# Patient Record
Sex: Female | Born: 1945 | Race: White | Hispanic: No | Marital: Married | Smoking: Never smoker
Health system: Southern US, Community
[De-identification: ages and names within clinical notes are randomized; demographics above are authoritative.]

## PROBLEM LIST (undated history)

## (undated) DIAGNOSIS — G5600 Carpal tunnel syndrome, unspecified upper limb: Secondary | ICD-10-CM

## (undated) DIAGNOSIS — M26609 Unspecified temporomandibular joint disorder, unspecified side: Secondary | ICD-10-CM

## (undated) DIAGNOSIS — J45909 Unspecified asthma, uncomplicated: Secondary | ICD-10-CM

## (undated) DIAGNOSIS — E119 Type 2 diabetes mellitus without complications: Secondary | ICD-10-CM

## (undated) HISTORY — PX: TUMOR REMOVAL: SHX12

## (undated) HISTORY — PX: CHOLECYSTECTOMY: SHX55

## (undated) HISTORY — DX: Unspecified asthma, uncomplicated: J45.909

## (undated) HISTORY — DX: Type 2 diabetes mellitus without complications: E11.9

## (undated) HISTORY — PX: SHOULDER SURGERY: SHX246

## (undated) HISTORY — PX: APPENDECTOMY: SHX54

## (undated) HISTORY — DX: Unspecified temporomandibular joint disorder, unspecified side: M26.609

## (undated) HISTORY — PX: PARTIAL HYSTERECTOMY: SHX80

## (undated) HISTORY — DX: Carpal tunnel syndrome, unspecified upper limb: G56.00

## (undated) MED FILL — Vinblastine Sulfate Inj 1 MG/ML: INTRAVENOUS | Qty: 7.5 | Status: AC

## (undated) MED FILL — Dacarbazine For Inj 200 MG: INTRAVENOUS | Qty: 47 | Status: AC

## (undated) MED FILL — Doxorubicin HCl Inj 2 MG/ML: INTRAVENOUS | Qty: 16 | Status: AC

## (undated) MED FILL — Fosaprepitant Dimeglumine For IV Infusion 150 MG (Base Eq): INTRAVENOUS | Qty: 5 | Status: AC

## (undated) MED FILL — Iron Sucrose Inj 20 MG/ML (Fe Equiv): INTRAVENOUS | Qty: 10 | Status: AC

---

## 1999-11-11 ENCOUNTER — Inpatient Hospital Stay (HOSPITAL_COMMUNITY): Admission: EM | Admit: 1999-11-11 | Discharge: 1999-11-14 | Payer: Self-pay | Admitting: Internal Medicine

## 2001-01-09 ENCOUNTER — Other Ambulatory Visit: Admission: RE | Admit: 2001-01-09 | Discharge: 2001-01-09 | Payer: Self-pay | Admitting: Family Medicine

## 2004-10-17 ENCOUNTER — Ambulatory Visit: Payer: Self-pay | Admitting: Family Medicine

## 2004-10-18 ENCOUNTER — Ambulatory Visit: Payer: Self-pay | Admitting: Cardiology

## 2004-10-18 ENCOUNTER — Observation Stay (HOSPITAL_COMMUNITY): Admission: AD | Admit: 2004-10-18 | Discharge: 2004-10-19 | Payer: Self-pay | Admitting: Nephrology

## 2004-10-27 ENCOUNTER — Ambulatory Visit: Payer: Self-pay | Admitting: Family Medicine

## 2004-12-19 ENCOUNTER — Ambulatory Visit: Payer: Self-pay | Admitting: Family Medicine

## 2005-06-01 ENCOUNTER — Ambulatory Visit: Payer: Self-pay | Admitting: Family Medicine

## 2005-06-28 ENCOUNTER — Ambulatory Visit: Payer: Self-pay | Admitting: Family Medicine

## 2005-07-04 ENCOUNTER — Ambulatory Visit: Payer: Self-pay | Admitting: Family Medicine

## 2005-07-11 ENCOUNTER — Ambulatory Visit: Payer: Self-pay | Admitting: Family Medicine

## 2005-08-27 ENCOUNTER — Ambulatory Visit: Payer: Self-pay | Admitting: Family Medicine

## 2005-10-12 ENCOUNTER — Ambulatory Visit: Payer: Self-pay | Admitting: Family Medicine

## 2005-11-06 ENCOUNTER — Ambulatory Visit: Payer: Self-pay | Admitting: Family Medicine

## 2005-11-16 ENCOUNTER — Ambulatory Visit: Payer: Self-pay | Admitting: Family Medicine

## 2005-12-19 ENCOUNTER — Ambulatory Visit: Payer: Self-pay | Admitting: Family Medicine

## 2015-08-25 ENCOUNTER — Encounter: Payer: Self-pay | Admitting: Allergy and Immunology

## 2015-08-25 ENCOUNTER — Ambulatory Visit (INDEPENDENT_AMBULATORY_CARE_PROVIDER_SITE_OTHER): Payer: Medicare HMO | Admitting: Allergy and Immunology

## 2015-08-25 VITALS — BP 128/78 | HR 82 | Temp 97.5°F | Resp 18 | Ht 62.99 in | Wt 168.7 lb

## 2015-08-25 DIAGNOSIS — J4540 Moderate persistent asthma, uncomplicated: Secondary | ICD-10-CM

## 2015-08-25 DIAGNOSIS — K219 Gastro-esophageal reflux disease without esophagitis: Secondary | ICD-10-CM | POA: Insufficient documentation

## 2015-08-25 DIAGNOSIS — J387 Other diseases of larynx: Secondary | ICD-10-CM | POA: Diagnosis not present

## 2015-08-25 MED ORDER — ALBUTEROL SULFATE (2.5 MG/3ML) 0.083% IN NEBU
2.5000 mg | INHALATION_SOLUTION | Freq: Once | RESPIRATORY_TRACT | Status: AC
  Administered 2015-08-25: 2.5 mg via RESPIRATORY_TRACT

## 2015-08-25 MED ORDER — MONTELUKAST SODIUM 10 MG PO TABS: 10.0000 mg | ORAL_TABLET | Freq: Every day | ORAL | Status: DC

## 2015-08-25 MED ORDER — MOMETASONE FURO-FORMOTEROL FUM 200-5 MCG/ACT IN AERO
2.0000 | INHALATION_SPRAY | Freq: Two times a day (BID) | RESPIRATORY_TRACT | Status: DC
Start: 2015-08-25 — End: 2015-11-01

## 2015-08-25 MED ORDER — RANITIDINE HCL 300 MG PO CAPS: 300.0000 mg | ORAL_CAPSULE | Freq: Every evening | ORAL | Status: DC

## 2015-08-25 NOTE — Patient Instructions (Signed)
  1. Allergen avoidance measure  2. Treat inflammation:   A. Dulera 200 - two puffs two times per day with spacer.   B. Montelukast 10 mg - 0ne tablet one time per day  3. Treat reflux:   A. No caffeine or chocolate   B. Omeprazole 40 - one tablet one time per day - AM   C. Ranitidine 300mg  - one tablet one time per day - PM  4. If needed:   A. Proair HFA - 2 puffs every 4-6 hours  5. Review last chest X-ray. May require another chest X-ray  6. Get a flu vaccine  7. Return in 4 weeks

## 2015-08-26 ENCOUNTER — Telehealth: Payer: Self-pay

## 2015-08-26 NOTE — Telephone Encounter (Signed)
I informed patient that her chest X-Ray is ok, per Dr.Kozlow.

## 2015-08-26 NOTE — Progress Notes (Signed)
Subjective:   Patient ID: Melanie Miles is a 69 y.o. female with a chief complaint of  Chief Complaint  Patient presents with  . Cough    with phlegm   and the following problems:  Problem  Moderate Persistent Asthma   Melanie Miles has a long history of recurrent coughing and wheezing that has not responded to good medical therapy administered in the past. She complains of having a very difficult time since November 2015 with constant coughing spells, spitting flem, throat clearing, raspy voice. She may be somewhat better since starting Omeprazole but she still has a SABA requirement of 1-3 times per day. She awakes at night with cough. She can not exercise. She has seen both an ENT and a GI doctor for these complaints. Triggers include perfume exposure. She can not remember the last time she had a chest X-ray.     Laryngopharyngeal Reflux   Melanie Miles has a long history of recurrent throat clearing, spitting, raspy voice and coughing spells with recurrent bouts of nausea, epigastric pain, and chest pain. She has seen a GI and ENT doctor for this issue and uses omeprazole 40mg  daily. She drinks tea a few times per week and chocolate a few times per day.      Past Medical History  Diagnosis Date  . Asthma   . Diabetes   . Carpal tunnel syndrome   . TMJ disease     Past Surgical History  Procedure Laterality Date  . Partial hysterectomy    . Cholecystectomy    . Appendectomy    . Tumor removal    . Shoulder surgery      No current outpatient prescriptions on file prior to visit.   No current facility-administered medications on file prior to visit.    Meds ordered this encounter  Medications  . pioglitazone (ACTOS) 30 MG tablet    Sig: Take 30 mg by mouth daily.  . hydroxychloroquine (PLAQUENIL) 200 MG tablet    Sig: Take 200 mg by mouth daily.  . mometasone (ASMANEX 60 METERED DOSES) 220 MCG/INH inhaler    Sig: Inhale 1 puff into the lungs. Use one inhalation one to two times  daily.  Marland Kitchen albuterol (VENTOLIN HFA) 108 (90 BASE) MCG/ACT inhaler    Sig: Inhale into the lungs every 6 (six) hours as needed for wheezing or shortness of breath. Inhale two puffs every four to six hours as needed.  Marland Kitchen omeprazole (PRILOSEC) 40 MG capsule    Sig: Take 40 mg by mouth daily.  . Cyanocobalamin (B-12 IJ)    Sig: Inject 1 Dose as directed. Take one injection once a month.  . montelukast (SINGULAIR) 10 MG tablet    Sig: Take 1 tablet (10 mg total) by mouth daily.    Dispense:  30 tablet    Refill:  5  . ranitidine (ZANTAC) 300 MG capsule    Sig: Take 1 capsule (300 mg total) by mouth every evening.    Dispense:  30 capsule    Refill:  5  . albuterol (PROVENTIL) (2.5 MG/3ML) 0.083% nebulizer solution 2.5 mg    Sig:   . mometasone-formoterol (DULERA) 200-5 MCG/ACT AERO    Sig: Inhale 2 puffs into the lungs 2 (two) times daily.    Dispense:  2 Inhaler    Refill:  0  . ALBUTEROL IN    Sig: Inhale 1 ampule into the lungs. Inhale contents of one ampule with nebulizer every four to six hours as needed for cough  or wheeze.    Allergies  Allergen Reactions  . Pyridium [Phenazopyridine Hcl] Other (See Comments)    Could not breathe  . Penicillins Itching  . Stadol [Butorphanol] Other (See Comments)    unknown    Review of Systems  Constitutional: Negative for fever, chills, weight loss and malaise/fatigue.  HENT: Positive for congestion and sore throat. Negative for ear pain, hearing loss, nosebleeds and tinnitus.   Eyes: Negative for redness.  Respiratory: Positive for cough, shortness of breath and wheezing. Negative for sputum production.   Cardiovascular: Positive for chest pain. Negative for leg swelling.  Gastrointestinal: Positive for heartburn, nausea and abdominal pain. Negative for vomiting and diarrhea.  Musculoskeletal: Positive for myalgias and joint pain.  Skin: Negative for itching and rash.  Neurological: Negative for dizziness and headaches.  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 Dementia Mother   . Diabetes Mother   . Cancer Father   . Arthritis Father   . Breast cancer Sister   . Rheum arthritis Sister   . Diabetes Brother   . Arthritis Brother   . Cancer Maternal Grandmother   . Cancer Maternal Grandfather   . Asthma Paternal Grandmother   . Arthritis Brother     Social History   Social History  . Marital Status: Married    Spouse Name: N/A  . Number of Children: N/A  . Years of Education: N/A   Occupational History  . Not on file.   Social History Main Topics  . Smoking status: Never Smoker   . Smokeless tobacco: Never Used  . Alcohol Use: No  . Drug Use: Not on file  . Sexual Activity: Not on file   Other Topics Concern  . Not on file   Social History Narrative  . No narrative on file    Environmental History: Pets in the home: none. Flooring: hardwood floors Climate Control: central or room air conditioning Dust Mite Controls: Dust mite controls are not in place. Tobacco Smoke in Home: no    Objective:   Filed Vitals:   08/25/15 1439  BP: 128/78  Pulse: 82  Temp: 97.5 F (36.4 C)  Resp: 18    Physical Exam  Constitutional: She is oriented to person, place, and time and well-developed, well-nourished, and in no distress.  HENT:  Right Ear: External ear normal.  Left Ear: External ear normal.  Mouth/Throat: Oropharynx is clear and moist.  Eyes: Conjunctivae are normal.  Neck: No JVD present. No tracheal deviation present. No thyromegaly present.  Cardiovascular: Normal rate, regular rhythm and normal heart sounds.  Exam reveals no gallop.   No murmur heard. Pulmonary/Chest: No stridor. No respiratory distress. She has no wheezes. She has no rales. She exhibits no tenderness.  Abdominal: She exhibits no distension and no mass. There is no tenderness. There is no rebound and no guarding.   Musculoskeletal: She exhibits no edema.  Lymphadenopathy:    She has no cervical adenopathy.  Neurological: She is alert and oriented to person, place, and time.  Skin: No rash noted. No erythema. No pallor.  Very raspy voice with throat clearing.   Diagnostics:  Percutaneous Skin Testing: demonstrated positive testd to dust mite Intradermal Skin Testing: did not demonstrate and positive tests  Spirometry was performed and demonstrated an FEV1 of 1.92 @ 88% predicted. After nebulized albuterol her FEV1 rose 2%  The patient had an Asthma Control  Test with the following results: ACT Total Score: 16.    Assessment and Plan:   Problem List Items Addressed This Visit      Respiratory   Moderate persistent asthma - Primary   Relevant Medications   mometasone (ASMANEX 60 METERED DOSES) 220 MCG/INH inhaler   albuterol (VENTOLIN HFA) 108 (90 BASE) MCG/ACT inhaler   montelukast (SINGULAIR) 10 MG tablet   albuterol (PROVENTIL) (2.5 MG/3ML) 0.083% nebulizer solution 2.5 mg (Completed)   mometasone-formoterol (DULERA) 200-5 MCG/ACT AERO   ALBUTEROL IN   Other Relevant Orders   Spirometry with Graph   Allergy Test   Laryngopharyngeal reflux   Relevant Medications   ranitidine (ZANTAC) 300 MG capsule     1. Allergen avoidance measure  2. Treat inflammation:   A. Dulera 200 - two puffs two times per day with spacer.   B. Montelukast 10 mg - 0ne tablet one time per day  3. Treat reflux:   A. No caffeine or chocolate   B. Omeprazole 40 - one tablet one time per day - AM   C. Ranitidine 300mg  - one tablet one time per day - PM  4. If needed:   A. Proair HFA - 2 puffs every 4-6 hours  5. Review last chest X-ray. May require another chest X-ray  6. Get a flu vaccine  7. Return in 4 weeks

## 2015-08-29 DIAGNOSIS — D510 Vitamin B12 deficiency anemia due to intrinsic factor deficiency: Secondary | ICD-10-CM | POA: Diagnosis not present

## 2015-09-05 DIAGNOSIS — K219 Gastro-esophageal reflux disease without esophagitis: Secondary | ICD-10-CM | POA: Diagnosis not present

## 2015-09-05 DIAGNOSIS — I10 Essential (primary) hypertension: Secondary | ICD-10-CM | POA: Diagnosis not present

## 2015-09-05 DIAGNOSIS — E1129 Type 2 diabetes mellitus with other diabetic kidney complication: Secondary | ICD-10-CM | POA: Diagnosis not present

## 2015-09-05 DIAGNOSIS — J4530 Mild persistent asthma, uncomplicated: Secondary | ICD-10-CM | POA: Diagnosis not present

## 2015-09-05 DIAGNOSIS — E785 Hyperlipidemia, unspecified: Secondary | ICD-10-CM | POA: Diagnosis not present

## 2015-09-05 DIAGNOSIS — R809 Proteinuria, unspecified: Secondary | ICD-10-CM | POA: Diagnosis not present

## 2015-09-05 DIAGNOSIS — Z79899 Other long term (current) drug therapy: Secondary | ICD-10-CM | POA: Diagnosis not present

## 2015-09-05 DIAGNOSIS — Z23 Encounter for immunization: Secondary | ICD-10-CM | POA: Diagnosis not present

## 2015-09-16 DIAGNOSIS — R69 Illness, unspecified: Secondary | ICD-10-CM | POA: Diagnosis not present

## 2015-09-20 ENCOUNTER — Ambulatory Visit (INDEPENDENT_AMBULATORY_CARE_PROVIDER_SITE_OTHER): Payer: Medicare HMO | Admitting: Pulmonary Disease

## 2015-09-20 ENCOUNTER — Encounter: Payer: Self-pay | Admitting: Pulmonary Disease

## 2015-09-20 VITALS — BP 124/72 | HR 97 | Temp 98.7°F | Ht 63.0 in | Wt 170.0 lb

## 2015-09-20 DIAGNOSIS — J4540 Moderate persistent asthma, uncomplicated: Secondary | ICD-10-CM

## 2015-09-20 MED ORDER — ARFORMOTEROL TARTRATE 15 MCG/2ML IN NEBU: 15.0000 ug | INHALATION_SOLUTION | Freq: Two times a day (BID) | RESPIRATORY_TRACT | Status: DC

## 2015-09-20 MED ORDER — FLUTICASONE PROPIONATE 50 MCG/ACT NA SUSP: 2.0000 | Freq: Every day | NASAL | Status: DC

## 2015-09-20 MED ORDER — OMEPRAZOLE 40 MG PO CPDR: 40.0000 mg | DELAYED_RELEASE_CAPSULE | Freq: Two times a day (BID) | ORAL | Status: DC

## 2015-09-20 MED ORDER — MOMETASONE FUROATE 220 MCG/INH IN AEPB: 2.0000 | INHALATION_SPRAY | Freq: Every day | RESPIRATORY_TRACT | Status: DC

## 2015-09-20 NOTE — Patient Instructions (Signed)
Continue the asmanex 2 puffs twice daily We will start you on brovana and flonase Take the prilosec twice daily. We will order a lung function test.  Return to clinic in 1 month.

## 2015-09-20 NOTE — Progress Notes (Signed)
Subjective:    Patient ID: Melanie Miles, female    DOB: 1946-06-26, 69 y.o.   MRN: [id number]  HPI Consult for chronic cough.  Melanie Miles is a 69 year old with past medical history as below. She has chronic recurrent cough, nonproductive in nature associated with wheezing. She reports that since November 15 her symptoms have worsened with daily coughing spells. She is using her albuterol rescue inhaler at least 2-3 times every day with little relief in symptoms. She has seen an ENT doctor in the past and has a diagnosis of recurrent laryngo pharyngeal reflux. She also recently had an EGD which was apparently unremarkable. She saw Dr. Neldon Mc at [facility] last month. Skin testing showed reactivity to dust mite only. She had spirometry and a chest x-ray performed which are unremarkable. She was started on [facility] but it made her cough worse. She was then seen by her primary care physician, Dr. Cathi Roan who started her on Advair 250/50. However she is unable to tolerate it as it makes her skin itch.  She reports strong sensitivities to perfumes and fragrances. No seasonal allergies. No rhinitis or postnasal drip. She is a never smoker. She does not drink alcohol. 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  PFTs (08/25/15) FVC 2.12 (77%) FEV1 1.92 (88%) F/F 90  CXR (08/25/15) No acute process.  Past Medical History  Diagnosis Date  . Asthma   . Diabetes ([place])   . Carpal tunnel syndrome   . TMJ disease     Current outpatient prescriptions:  .  albuterol (VENTOLIN HFA) 108 (90 BASE) MCG/ACT inhaler, Inhale into the lungs every 6 (six) hours as needed for wheezing or shortness of breath. Inhale two puffs every four to six hours as needed., Disp: , Rfl:  .  Cyanocobalamin (B-12 IJ), Inject 1 Dose as directed. Take one injection once a month., Disp: , Rfl:  .  hydroxychloroquine (PLAQUENIL) 200 MG tablet, Take 200 mg by mouth daily., Disp: , Rfl:  .  montelukast (SINGULAIR) 10 MG  tablet, Take 1 tablet (10 mg total) by mouth daily., Disp: 30 tablet, Rfl: 5 .  omeprazole (PRILOSEC) 40 MG capsule, Take 1 capsule (40 mg total) by mouth 2 (two) times daily before a meal., Disp: 60 capsule, Rfl: 5 .  pioglitazone (ACTOS) 30 MG tablet, Take 30 mg by mouth daily., Disp: , Rfl:  .  ALBUTEROL IN, Inhale 1 ampule into the lungs. Inhale contents of one ampule with nebulizer every four to six hours as needed for cough or wheeze., Disp: , Rfl:  .  arformoterol (BROVANA) 15 MCG/2ML NEBU, Take 2 mLs (15 mcg total) by nebulization 2 (two) times daily., Disp: 120 mL, Rfl: 5 .  fluticasone (FLONASE) 50 MCG/ACT nasal spray, Place 2 sprays into both nostrils daily., Disp: 16 g, Rfl: 5 .  mometasone (ASMANEX 120 METERED DOSES) 220 MCG/INH inhaler, Inhale 2 puffs into the lungs daily., Disp: 1 Inhaler, Rfl: 5 .  mometasone-formoterol (DULERA) 200-5 MCG/ACT AERO, Inhale 2 puffs into the lungs 2 (two) times daily. (Patient not taking: Reported on 09/20/2015), Disp: 2 Inhaler, Rfl: 0 .  ranitidine (ZANTAC) 300 MG capsule, Take 1 capsule (300 mg total) by mouth every evening. (Patient not taking: Reported on 09/20/2015), Disp: 30 capsule, Rfl: 5   Review of Systems  Cough with wheezing, no sputum production, fevers, chills, hemoptysis. Mild nasal congestion, sore throat no nasal drip. Denies any chest pain, palpitations Denies any nausea, vomiting, diarrhea, constipation.  Next and all other review of systems negative.    Objective:   Physical Exam Blood pressure 124/72, pulse 97, temperature 98.7 F (37.1 C), temperature source Oral, height 5\' 3"  (1.6 m), weight 170 lb (77.111 kg), SpO2 97 %.  Gen: No apparent distress Neuro: No gross focal deficits. Neck: No JVD, lymphadenopathy, thyromegaly. RS: Clear. No wheeze or crackles. CVS: S1-S2 heard, no murmurs rubs gallops. Abdomen: Soft, positive bowel sounds. Extremities: No edema.     Assessment & Plan:  Chronic cough. Moderate  persistent asthma. Laryngeal pharyngeal reflux.  She does have symptoms consistent with asthma although her PFTs from last month did not show any obstruction or bronchodilator response. Unfortunately she is intolerant of both Dulera and Advair. I will put her back on the Asmanex and start her on Brovana twice a day. I also order a methacholine challenge test. She needs optimization of her rhinitis, GERD and LPR. I'll increase her Prilosec to twice a day and start her on Flonase.   She'll benefit from a repeat ENT eval and also sleep study she has symptoms of sleep apnea. However she declined these and would like to try the above therapies first.  Plan: - Brovona, asmanex, flonase - Increase PPI to twice daily   Marshell Garfinkel MD [facility] Pager [phone] If no answer or after 3pm call: [phone] 09/20/2015, 5:47 PM

## 2015-09-22 ENCOUNTER — Telehealth: Payer: Self-pay | Admitting: Pulmonary Disease

## 2015-09-22 DIAGNOSIS — J4540 Moderate persistent asthma, uncomplicated: Secondary | ICD-10-CM

## 2015-09-22 MED ORDER — ARFORMOTEROL TARTRATE 15 MCG/2ML IN NEBU: 15.0000 ug | INHALATION_SOLUTION | Freq: Two times a day (BID) | RESPIRATORY_TRACT | Status: DC

## 2015-09-22 NOTE — Telephone Encounter (Signed)
Called and spoke to pt. Pt states she was informed she cannot get her Brovana neb through [facility] because they are out of her network. Advised pt a new order can be placed to change DME companies to a DME that is network. New order placed. Pt verbalized understanding and denied any further questions or concerns at this time.

## 2015-09-22 NOTE — Addendum Note (Signed)
Addended by: Maurice March on: 09/22/2015 11:29 AM   Modules accepted: Orders

## 2015-09-26 ENCOUNTER — Ambulatory Visit: Payer: Medicare HMO | Admitting: Allergy and Immunology

## 2015-09-26 DIAGNOSIS — D510 Vitamin B12 deficiency anemia due to intrinsic factor deficiency: Secondary | ICD-10-CM | POA: Diagnosis not present

## 2015-09-27 DIAGNOSIS — K219 Gastro-esophageal reflux disease without esophagitis: Secondary | ICD-10-CM | POA: Diagnosis not present

## 2015-09-27 DIAGNOSIS — M8580 Other specified disorders of bone density and structure, unspecified site: Secondary | ICD-10-CM | POA: Diagnosis not present

## 2015-09-27 DIAGNOSIS — E119 Type 2 diabetes mellitus without complications: Secondary | ICD-10-CM | POA: Diagnosis not present

## 2015-09-27 DIAGNOSIS — M150 Primary generalized (osteo)arthritis: Secondary | ICD-10-CM | POA: Diagnosis not present

## 2015-09-27 DIAGNOSIS — R05 Cough: Secondary | ICD-10-CM | POA: Diagnosis not present

## 2015-09-27 DIAGNOSIS — Z87440 Personal history of urinary (tract) infections: Secondary | ICD-10-CM | POA: Diagnosis not present

## 2015-09-27 DIAGNOSIS — E079 Disorder of thyroid, unspecified: Secondary | ICD-10-CM | POA: Diagnosis not present

## 2015-09-27 DIAGNOSIS — M1990 Unspecified osteoarthritis, unspecified site: Secondary | ICD-10-CM | POA: Diagnosis not present

## 2015-09-27 DIAGNOSIS — Z79899 Other long term (current) drug therapy: Secondary | ICD-10-CM | POA: Diagnosis not present

## 2015-09-27 DIAGNOSIS — M359 Systemic involvement of connective tissue, unspecified: Secondary | ICD-10-CM | POA: Diagnosis not present

## 2015-10-03 DIAGNOSIS — R0602 Shortness of breath: Secondary | ICD-10-CM | POA: Diagnosis not present

## 2015-10-03 DIAGNOSIS — N63 Unspecified lump in breast: Secondary | ICD-10-CM | POA: Diagnosis not present

## 2015-10-06 ENCOUNTER — Telehealth: Payer: Self-pay | Admitting: Pulmonary Disease

## 2015-10-06 DIAGNOSIS — J4540 Moderate persistent asthma, uncomplicated: Secondary | ICD-10-CM

## 2015-10-06 MED ORDER — FORMOTEROL FUMARATE 20 MCG/2ML IN NEBU: 20.0000 ug | INHALATION_SOLUTION | Freq: Two times a day (BID) | RESPIRATORY_TRACT | Status: DC

## 2015-10-06 NOTE — Telephone Encounter (Signed)
foradil is aka perforomist. Spoke w/ pt and is aware of below. I have printed off RX and given to pcc's for this to go through [facility]. Nothing further needed

## 2015-10-06 NOTE — Telephone Encounter (Signed)
Can you change brovana to foradil nebs.

## 2015-10-06 NOTE — Telephone Encounter (Signed)
Per 09/20/15 OV: Patient Instructions       Continue the asmanex 2 puffs twice daily We will start you on brovana and flonase Take the prilosec twice daily. We will order a lung function test.  Return to clinic in 1 month.  ----  Spoke with pt. She reports the brovana makes her stomach feel very upset (stomach cramping). She reports it only happens when she uses the brovana. If she eats after her brovana it makes her stomach pains worse. Please advise Dr. Vaughan Browner thanks

## 2015-10-07 DIAGNOSIS — J4540 Moderate persistent asthma, uncomplicated: Secondary | ICD-10-CM | POA: Diagnosis not present

## 2015-10-19 DIAGNOSIS — N63 Unspecified lump in breast: Secondary | ICD-10-CM | POA: Diagnosis not present

## 2015-10-19 DIAGNOSIS — D241 Benign neoplasm of right breast: Secondary | ICD-10-CM | POA: Diagnosis not present

## 2015-10-24 ENCOUNTER — Encounter (HOSPITAL_COMMUNITY): Payer: Medicare HMO

## 2015-10-24 ENCOUNTER — Ambulatory Visit: Payer: Medicare HMO | Admitting: Pulmonary Disease

## 2015-10-27 DIAGNOSIS — D510 Vitamin B12 deficiency anemia due to intrinsic factor deficiency: Secondary | ICD-10-CM | POA: Diagnosis not present

## 2015-11-01 ENCOUNTER — Ambulatory Visit (INDEPENDENT_AMBULATORY_CARE_PROVIDER_SITE_OTHER): Payer: Medicare HMO | Admitting: Pulmonary Disease

## 2015-11-01 ENCOUNTER — Encounter: Payer: Self-pay | Admitting: Pulmonary Disease

## 2015-11-01 ENCOUNTER — Ambulatory Visit (HOSPITAL_COMMUNITY)
Admission: RE | Admit: 2015-11-01 | Discharge: 2015-11-01 | Disposition: A | Discharge status: Home / Self Care | Payer: Medicare HMO | Source: Ambulatory Visit | Attending: Pulmonary Disease | Admitting: Pulmonary Disease

## 2015-11-01 VITALS — BP 144/82 | HR 97 | Temp 98.7°F | Ht 63.0 in | Wt 170.0 lb

## 2015-11-01 DIAGNOSIS — R05 Cough: Secondary | ICD-10-CM | POA: Diagnosis not present

## 2015-11-01 DIAGNOSIS — R059 Cough, unspecified: Secondary | ICD-10-CM

## 2015-11-01 DIAGNOSIS — J4540 Moderate persistent asthma, uncomplicated: Secondary | ICD-10-CM | POA: Diagnosis not present

## 2015-11-01 LAB — PULMONARY FUNCTION TEST
FEF 25-75 Post: 2.77 L/sec
FEF 25-75 Pre: 2.44 L/sec
FEF2575-%CHANGE-POST: 13 %
FEF2575-%PRED-POST: 141 %
FEF2575-%PRED-PRE: 124 %
FEV1-%CHANGE-POST: 10 %
FEV1-%Pred-Post: 93 %
FEV1-%Pred-Pre: 85 %
FEV1-PRE: 1.92 L
FEV1-Post: 2.11 L
FEV1FVC-%CHANGE-POST: 3 %
FEV1FVC-%PRED-PRE: 112 %
FEV6-%Change-Post: 6 %
FEV6-%Pred-Post: 84 %
FEV6-%Pred-Pre: 79 %
FEV6-PRE: 2.25 L
FEV6-Post: 2.4 L
FEV6FVC-%PRED-PRE: 104 %
FEV6FVC-%Pred-Post: 104 %
FVC-%CHANGE-POST: 6 %
FVC-%Pred-Post: 81 %
FVC-%Pred-Pre: 75 %
FVC-POST: 2.4 L
FVC-PRE: 2.25 L
POST FEV1/FVC RATIO: 88 %
PRE FEV6/FVC RATIO: 100 %
Post FEV6/FVC ratio: 100 %
Pre FEV1/FVC ratio: 85 %

## 2015-11-01 MED ORDER — METHACHOLINE 16 MG/ML NEB SOLN
2.0000 mL | Freq: Once | RESPIRATORY_TRACT | Status: AC
  Administered 2015-11-01: 32 mg via RESPIRATORY_TRACT

## 2015-11-01 MED ORDER — METHACHOLINE 0.25 MG/ML NEB SOLN
2.0000 mL | Freq: Once | RESPIRATORY_TRACT | Status: AC
  Administered 2015-11-01: 0.5 mg via RESPIRATORY_TRACT

## 2015-11-01 MED ORDER — SODIUM CHLORIDE 0.9 % IN NEBU
3.0000 mL | INHALATION_SOLUTION | Freq: Once | RESPIRATORY_TRACT | Status: AC
  Administered 2015-11-01: 3 mL via RESPIRATORY_TRACT

## 2015-11-01 MED ORDER — BENZONATATE 200 MG PO CAPS: 200.0000 mg | ORAL_CAPSULE | Freq: Three times a day (TID) | ORAL | Status: DC | PRN

## 2015-11-01 MED ORDER — ALBUTEROL SULFATE (2.5 MG/3ML) 0.083% IN NEBU
2.5000 mg | INHALATION_SOLUTION | Freq: Once | RESPIRATORY_TRACT | Status: AC
  Administered 2015-11-01: 2.5 mg via RESPIRATORY_TRACT

## 2015-11-01 MED ORDER — METHACHOLINE 0.0625 MG/ML NEB SOLN
2.0000 mL | Freq: Once | RESPIRATORY_TRACT | Status: AC
  Administered 2015-11-01: 0.125 mg via RESPIRATORY_TRACT

## 2015-11-01 MED ORDER — METHACHOLINE 1 MG/ML NEB SOLN
2.0000 mL | Freq: Once | RESPIRATORY_TRACT | Status: AC
  Administered 2015-11-01: 2 mg via RESPIRATORY_TRACT

## 2015-11-01 MED ORDER — METHACHOLINE 4 MG/ML NEB SOLN
2.0000 mL | Freq: Once | RESPIRATORY_TRACT | Status: AC
  Administered 2015-11-01: 8 mg via RESPIRATORY_TRACT

## 2015-11-01 NOTE — Patient Instructions (Addendum)
Start Tessalon for cough Continue using the Flonase and PPI twice a day.  Return to clinic in 6 months

## 2015-11-01 NOTE — Progress Notes (Signed)
Subjective:    Patient ID: Melanie Miles, female    DOB: 08-19-1946, 69 y.o.   MRN: [id number]  Problem list: Chronic cough  HPI Melanie Miles is a 69 year old with past medical history as below. She has chronic recurrent cough, nonproductive in nature associated with wheezing. She reports that since November 15 her symptoms have worsened with daily coughing spells. She is using her albuterol rescue inhaler at least 2-3 times every day with little relief in symptoms. She has seen an ENT doctor in the past and has a diagnosis of recurrent laryngo pharyngeal reflux. She also recently had an EGD which was apparently unremarkable. She saw Dr. Neldon Mc at [place] in September. Skin testing showed reactivity to dust mite only. She had spirometry and a chest x-ray performed which are unremarkable. She was started on [facility] but it made her cough worse. She was then seen by her primary care physician, Dr. Cathi Roan who started her on Advair 250/50. However she is unable to tolerate it as it makes her skin itch.we started her on Brovana and then foradil nebulizers but she cannot afford either.  She saw her rheumatologist who did a CT of the chest. I reviewed this images. There is no abnormality in the lungs detected.does however and nodule in the breast that had been stable since 2008. This is being followed up by her primary care physician.  She reports strong sensitivities to perfumes and fragrances. No seasonal allergies. No rhinitis or postnasal drip. She is a never smoker. She does not drink alcohol. 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  CT chest 11/01/15 No abnormalities in his chest. Nodule in the breast  PFTs (08/25/15) FVC 2.12 (77%) FEV1 1.92 (88%) F/F 90  PFTs 11/01/15 Methacholine challenge FVC 2.25 [75%] fEV1 1.9 to [855] F/F 85 Negative test  CXR (08/25/15) No acute process.  Past Medical History  Diagnosis Date  . Asthma   . Diabetes ([place])   . Carpal tunnel  syndrome   . TMJ disease     Current outpatient prescriptions:  .  albuterol (VENTOLIN HFA) 108 (90 BASE) MCG/ACT inhaler, Inhale into the lungs every 6 (six) hours as needed for wheezing or shortness of breath. Inhale two puffs every four to six hours as needed., Disp: , Rfl:  .  ALBUTEROL IN, Inhale 1 ampule into the lungs. Inhale contents of one ampule with nebulizer every four to six hours as needed for cough or wheeze., Disp: , Rfl:  .  Cyanocobalamin (B-12 IJ), Inject 1 Dose as directed. Take one injection once a month., Disp: , Rfl:  .  fluticasone (FLONASE) 50 MCG/ACT nasal spray, Place 2 sprays into both nostrils daily., Disp: 16 g, Rfl: 5 .  hydroxychloroquine (PLAQUENIL) 200 MG tablet, Take 200 mg by mouth daily., Disp: , Rfl:  .  mometasone (ASMANEX 120 METERED DOSES) 220 MCG/INH inhaler, Inhale 2 puffs into the lungs daily., Disp: 1 Inhaler, Rfl: 5 .  omeprazole (PRILOSEC) 40 MG capsule, Take 1 capsule (40 mg total) by mouth 2 (two) times daily before a meal., Disp: 60 capsule, Rfl: 5 .  pioglitazone (ACTOS) 30 MG tablet, Take 30 mg by mouth daily., Disp: , Rfl:  .  formoterol (PERFOROMIST) 20 MCG/2ML nebulizer solution, Take 2 mLs (20 mcg total) by nebulization 2 (two) times daily. (Patient not taking: Reported on 11/01/2015), Disp: 120 mL, Rfl: 6 .  montelukast (SINGULAIR) 10 MG tablet, Take 1 tablet (10 mg total) by mouth daily. (  Patient not taking: Reported on 11/01/2015), Disp: 30 tablet, Rfl: 5   Review of Systems  Cough with wheezing, no sputum production, fevers, chills, hemoptysis. Mild nasal congestion, sore throat no nasal drip. Denies any chest pain, palpitations Denies any nausea, vomiting, diarrhea, constipation. Next and all other review of systems negative.    Objective:   Physical Exam Blood pressure 124/72, pulse 97, temperature 98.7 F (37.1 C), temperature source Oral, height 5\' 3"  (1.6 m), weight 170 lb (77.111 kg), SpO2 97 %.  Gen: No apparent  distress Neuro: No gross focal deficits. Neck: No JVD, lymphadenopathy, thyromegaly. RS: Clear. No wheeze or crackles. CVS: S1-S2 heard, no murmurs rubs gallops. Abdomen: Soft, positive bowel sounds. Extremities: No edema.     Assessment & Plan:  Chronic cough. Laryngeal pharyngeal reflux.  I do not believe she has asthma. She does not have a bronchodilator response and methacholine challenge test is negative She needs optimization of her rhinitis, GERD and LPR. She'll continue her Flonase and her Prilosec twice a day   She'll benefit from a repeat ENT eval and also sleep study she has symptoms of sleep apnea. However she declined these and would like to try the above therapies first.  Plan: - start [place] and Flonase  Marshell Garfinkel MD [place] [facility] [facility] Pager [phone] If no answer or after 3pm call: [phone] 11/01/2015, 3:06 PM

## 2015-11-01 NOTE — Addendum Note (Signed)
Addended by: Parke Poisson E on: 11/01/2015 05:12 PM   Modules accepted: Orders

## 2015-11-10 DIAGNOSIS — K5792 Diverticulitis of intestine, part unspecified, without perforation or abscess without bleeding: Secondary | ICD-10-CM | POA: Diagnosis not present

## 2015-11-29 DIAGNOSIS — D510 Vitamin B12 deficiency anemia due to intrinsic factor deficiency: Secondary | ICD-10-CM | POA: Diagnosis not present

## 2015-12-10 DIAGNOSIS — R69 Illness, unspecified: Secondary | ICD-10-CM | POA: Diagnosis not present

## 2015-12-13 DIAGNOSIS — Z23 Encounter for immunization: Secondary | ICD-10-CM | POA: Diagnosis not present

## 2015-12-13 DIAGNOSIS — Z0000 Encounter for general adult medical examination without abnormal findings: Secondary | ICD-10-CM | POA: Diagnosis not present

## 2015-12-13 DIAGNOSIS — D510 Vitamin B12 deficiency anemia due to intrinsic factor deficiency: Secondary | ICD-10-CM | POA: Diagnosis not present

## 2015-12-28 DIAGNOSIS — D510 Vitamin B12 deficiency anemia due to intrinsic factor deficiency: Secondary | ICD-10-CM | POA: Diagnosis not present

## 2016-01-11 DIAGNOSIS — B9689 Other specified bacterial agents as the cause of diseases classified elsewhere: Secondary | ICD-10-CM | POA: Diagnosis not present

## 2016-01-11 DIAGNOSIS — J208 Acute bronchitis due to other specified organisms: Secondary | ICD-10-CM | POA: Diagnosis not present

## 2016-01-17 DIAGNOSIS — J208 Acute bronchitis due to other specified organisms: Secondary | ICD-10-CM | POA: Diagnosis not present

## 2016-01-17 DIAGNOSIS — B9689 Other specified bacterial agents as the cause of diseases classified elsewhere: Secondary | ICD-10-CM | POA: Diagnosis not present

## 2016-01-17 DIAGNOSIS — R69 Illness, unspecified: Secondary | ICD-10-CM | POA: Diagnosis not present

## 2016-01-24 DIAGNOSIS — D510 Vitamin B12 deficiency anemia due to intrinsic factor deficiency: Secondary | ICD-10-CM | POA: Diagnosis not present

## 2016-02-02 DIAGNOSIS — Z888 Allergy status to other drugs, medicaments and biological substances status: Secondary | ICD-10-CM | POA: Diagnosis not present

## 2016-02-02 DIAGNOSIS — Z882 Allergy status to sulfonamides status: Secondary | ICD-10-CM | POA: Diagnosis not present

## 2016-02-02 DIAGNOSIS — Z885 Allergy status to narcotic agent status: Secondary | ICD-10-CM | POA: Diagnosis not present

## 2016-02-02 DIAGNOSIS — N390 Urinary tract infection, site not specified: Secondary | ICD-10-CM | POA: Diagnosis not present

## 2016-02-02 DIAGNOSIS — Z880 Allergy status to penicillin: Secondary | ICD-10-CM | POA: Diagnosis not present

## 2016-02-02 DIAGNOSIS — B9620 Unspecified Escherichia coli [E. coli] as the cause of diseases classified elsewhere: Secondary | ICD-10-CM | POA: Diagnosis not present

## 2016-02-02 DIAGNOSIS — K5730 Diverticulosis of large intestine without perforation or abscess without bleeding: Secondary | ICD-10-CM | POA: Diagnosis not present

## 2016-02-09 DIAGNOSIS — N3000 Acute cystitis without hematuria: Secondary | ICD-10-CM | POA: Diagnosis not present

## 2016-02-15 DIAGNOSIS — R300 Dysuria: Secondary | ICD-10-CM | POA: Diagnosis not present

## 2016-02-15 DIAGNOSIS — N390 Urinary tract infection, site not specified: Secondary | ICD-10-CM | POA: Diagnosis not present

## 2016-02-15 DIAGNOSIS — R1033 Periumbilical pain: Secondary | ICD-10-CM | POA: Diagnosis not present

## 2016-02-28 DIAGNOSIS — D510 Vitamin B12 deficiency anemia due to intrinsic factor deficiency: Secondary | ICD-10-CM | POA: Diagnosis not present

## 2016-03-26 DIAGNOSIS — E113291 Type 2 diabetes mellitus with mild nonproliferative diabetic retinopathy without macular edema, right eye: Secondary | ICD-10-CM | POA: Diagnosis not present

## 2016-03-26 DIAGNOSIS — M0609 Rheumatoid arthritis without rheumatoid factor, multiple sites: Secondary | ICD-10-CM | POA: Diagnosis not present

## 2016-03-26 DIAGNOSIS — Z79899 Other long term (current) drug therapy: Secondary | ICD-10-CM | POA: Diagnosis not present

## 2016-03-26 DIAGNOSIS — H5442 Blindness, left eye, normal vision right eye: Secondary | ICD-10-CM | POA: Diagnosis not present

## 2016-03-27 DIAGNOSIS — D510 Vitamin B12 deficiency anemia due to intrinsic factor deficiency: Secondary | ICD-10-CM | POA: Diagnosis not present

## 2016-03-27 DIAGNOSIS — N390 Urinary tract infection, site not specified: Secondary | ICD-10-CM | POA: Diagnosis not present

## 2016-03-30 DIAGNOSIS — N3000 Acute cystitis without hematuria: Secondary | ICD-10-CM | POA: Diagnosis not present

## 2016-04-02 DIAGNOSIS — Z79899 Other long term (current) drug therapy: Secondary | ICD-10-CM | POA: Diagnosis not present

## 2016-04-02 DIAGNOSIS — K648 Other hemorrhoids: Secondary | ICD-10-CM | POA: Diagnosis not present

## 2016-04-02 DIAGNOSIS — E119 Type 2 diabetes mellitus without complications: Secondary | ICD-10-CM | POA: Diagnosis not present

## 2016-04-02 DIAGNOSIS — Z87440 Personal history of urinary (tract) infections: Secondary | ICD-10-CM | POA: Diagnosis not present

## 2016-04-02 DIAGNOSIS — K219 Gastro-esophageal reflux disease without esophagitis: Secondary | ICD-10-CM | POA: Diagnosis not present

## 2016-04-02 DIAGNOSIS — M1990 Unspecified osteoarthritis, unspecified site: Secondary | ICD-10-CM | POA: Diagnosis not present

## 2016-04-02 DIAGNOSIS — E559 Vitamin D deficiency, unspecified: Secondary | ICD-10-CM | POA: Diagnosis not present

## 2016-04-02 DIAGNOSIS — E079 Disorder of thyroid, unspecified: Secondary | ICD-10-CM | POA: Diagnosis not present

## 2016-04-02 DIAGNOSIS — M5431 Sciatica, right side: Secondary | ICD-10-CM | POA: Diagnosis not present

## 2016-04-02 DIAGNOSIS — M47816 Spondylosis without myelopathy or radiculopathy, lumbar region: Secondary | ICD-10-CM | POA: Diagnosis not present

## 2016-04-02 DIAGNOSIS — M150 Primary generalized (osteo)arthritis: Secondary | ICD-10-CM | POA: Diagnosis not present

## 2016-04-02 DIAGNOSIS — M359 Systemic involvement of connective tissue, unspecified: Secondary | ICD-10-CM | POA: Diagnosis not present

## 2016-04-09 DIAGNOSIS — N390 Urinary tract infection, site not specified: Secondary | ICD-10-CM | POA: Diagnosis not present

## 2016-05-04 DIAGNOSIS — D510 Vitamin B12 deficiency anemia due to intrinsic factor deficiency: Secondary | ICD-10-CM | POA: Diagnosis not present

## 2016-05-07 DIAGNOSIS — M25562 Pain in left knee: Secondary | ICD-10-CM | POA: Diagnosis not present

## 2016-05-22 DIAGNOSIS — N390 Urinary tract infection, site not specified: Secondary | ICD-10-CM | POA: Diagnosis not present

## 2016-05-31 DIAGNOSIS — E538 Deficiency of other specified B group vitamins: Secondary | ICD-10-CM | POA: Diagnosis not present

## 2016-06-04 DIAGNOSIS — R69 Illness, unspecified: Secondary | ICD-10-CM | POA: Diagnosis not present

## 2016-06-19 DIAGNOSIS — S92355D Nondisplaced fracture of fifth metatarsal bone, left foot, subsequent encounter for fracture with routine healing: Secondary | ICD-10-CM | POA: Diagnosis not present

## 2016-06-19 DIAGNOSIS — Z1389 Encounter for screening for other disorder: Secondary | ICD-10-CM | POA: Diagnosis not present

## 2016-06-19 DIAGNOSIS — Z9181 History of falling: Secondary | ICD-10-CM | POA: Diagnosis not present

## 2016-06-19 DIAGNOSIS — Z79899 Other long term (current) drug therapy: Secondary | ICD-10-CM | POA: Diagnosis not present

## 2016-06-19 DIAGNOSIS — K219 Gastro-esophageal reflux disease without esophagitis: Secondary | ICD-10-CM | POA: Diagnosis not present

## 2016-06-19 DIAGNOSIS — Z0000 Encounter for general adult medical examination without abnormal findings: Secondary | ICD-10-CM | POA: Diagnosis not present

## 2016-06-19 DIAGNOSIS — E538 Deficiency of other specified B group vitamins: Secondary | ICD-10-CM | POA: Diagnosis not present

## 2016-06-19 DIAGNOSIS — E1129 Type 2 diabetes mellitus with other diabetic kidney complication: Secondary | ICD-10-CM | POA: Diagnosis not present

## 2016-06-19 DIAGNOSIS — I10 Essential (primary) hypertension: Secondary | ICD-10-CM | POA: Diagnosis not present

## 2016-06-19 DIAGNOSIS — E785 Hyperlipidemia, unspecified: Secondary | ICD-10-CM | POA: Diagnosis not present

## 2016-06-22 DIAGNOSIS — N390 Urinary tract infection, site not specified: Secondary | ICD-10-CM | POA: Diagnosis not present

## 2016-07-16 DIAGNOSIS — S92354G Nondisplaced fracture of fifth metatarsal bone, right foot, subsequent encounter for fracture with delayed healing: Secondary | ICD-10-CM | POA: Diagnosis not present

## 2016-07-16 DIAGNOSIS — E1129 Type 2 diabetes mellitus with other diabetic kidney complication: Secondary | ICD-10-CM | POA: Diagnosis not present

## 2016-07-16 DIAGNOSIS — I10 Essential (primary) hypertension: Secondary | ICD-10-CM | POA: Diagnosis not present

## 2016-07-16 DIAGNOSIS — R809 Proteinuria, unspecified: Secondary | ICD-10-CM | POA: Diagnosis not present

## 2016-07-16 DIAGNOSIS — M0579 Rheumatoid arthritis with rheumatoid factor of multiple sites without organ or systems involvement: Secondary | ICD-10-CM | POA: Diagnosis not present

## 2016-07-24 DIAGNOSIS — M2042 Other hammer toe(s) (acquired), left foot: Secondary | ICD-10-CM | POA: Diagnosis not present

## 2016-07-24 DIAGNOSIS — M7742 Metatarsalgia, left foot: Secondary | ICD-10-CM | POA: Diagnosis not present

## 2016-07-24 DIAGNOSIS — S92302A Fracture of unspecified metatarsal bone(s), left foot, initial encounter for closed fracture: Secondary | ICD-10-CM | POA: Diagnosis not present

## 2016-07-31 DIAGNOSIS — K5792 Diverticulitis of intestine, part unspecified, without perforation or abscess without bleeding: Secondary | ICD-10-CM | POA: Diagnosis not present

## 2016-08-09 DIAGNOSIS — M5431 Sciatica, right side: Secondary | ICD-10-CM | POA: Diagnosis not present

## 2016-08-09 DIAGNOSIS — E079 Disorder of thyroid, unspecified: Secondary | ICD-10-CM | POA: Diagnosis not present

## 2016-08-09 DIAGNOSIS — Z87440 Personal history of urinary (tract) infections: Secondary | ICD-10-CM | POA: Diagnosis not present

## 2016-08-09 DIAGNOSIS — Z79899 Other long term (current) drug therapy: Secondary | ICD-10-CM | POA: Diagnosis not present

## 2016-08-09 DIAGNOSIS — M1990 Unspecified osteoarthritis, unspecified site: Secondary | ICD-10-CM | POA: Diagnosis not present

## 2016-08-09 DIAGNOSIS — M150 Primary generalized (osteo)arthritis: Secondary | ICD-10-CM | POA: Diagnosis not present

## 2016-08-09 DIAGNOSIS — M47816 Spondylosis without myelopathy or radiculopathy, lumbar region: Secondary | ICD-10-CM | POA: Diagnosis not present

## 2016-08-09 DIAGNOSIS — K219 Gastro-esophageal reflux disease without esophagitis: Secondary | ICD-10-CM | POA: Diagnosis not present

## 2016-08-09 DIAGNOSIS — E119 Type 2 diabetes mellitus without complications: Secondary | ICD-10-CM | POA: Diagnosis not present

## 2016-08-09 DIAGNOSIS — M359 Systemic involvement of connective tissue, unspecified: Secondary | ICD-10-CM | POA: Diagnosis not present

## 2016-08-16 DIAGNOSIS — N390 Urinary tract infection, site not specified: Secondary | ICD-10-CM | POA: Diagnosis not present

## 2016-09-03 DIAGNOSIS — S92302D Fracture of unspecified metatarsal bone(s), left foot, subsequent encounter for fracture with routine healing: Secondary | ICD-10-CM | POA: Diagnosis not present

## 2016-09-03 DIAGNOSIS — M2042 Other hammer toe(s) (acquired), left foot: Secondary | ICD-10-CM | POA: Diagnosis not present

## 2016-09-10 DIAGNOSIS — Z87440 Personal history of urinary (tract) infections: Secondary | ICD-10-CM | POA: Diagnosis not present

## 2016-09-10 DIAGNOSIS — N390 Urinary tract infection, site not specified: Secondary | ICD-10-CM | POA: Diagnosis not present

## 2016-09-13 DIAGNOSIS — B9689 Other specified bacterial agents as the cause of diseases classified elsewhere: Secondary | ICD-10-CM | POA: Diagnosis not present

## 2016-09-13 DIAGNOSIS — J019 Acute sinusitis, unspecified: Secondary | ICD-10-CM | POA: Diagnosis not present

## 2016-09-13 DIAGNOSIS — M0579 Rheumatoid arthritis with rheumatoid factor of multiple sites without organ or systems involvement: Secondary | ICD-10-CM | POA: Diagnosis not present

## 2016-09-28 DIAGNOSIS — Z23 Encounter for immunization: Secondary | ICD-10-CM | POA: Diagnosis not present

## 2016-10-05 DIAGNOSIS — K219 Gastro-esophageal reflux disease without esophagitis: Secondary | ICD-10-CM | POA: Diagnosis not present

## 2016-10-05 DIAGNOSIS — R112 Nausea with vomiting, unspecified: Secondary | ICD-10-CM | POA: Diagnosis not present

## 2016-10-05 DIAGNOSIS — J029 Acute pharyngitis, unspecified: Secondary | ICD-10-CM | POA: Diagnosis not present

## 2016-10-08 DIAGNOSIS — E1129 Type 2 diabetes mellitus with other diabetic kidney complication: Secondary | ICD-10-CM | POA: Diagnosis not present

## 2016-10-08 DIAGNOSIS — R809 Proteinuria, unspecified: Secondary | ICD-10-CM | POA: Diagnosis not present

## 2016-10-08 DIAGNOSIS — J019 Acute sinusitis, unspecified: Secondary | ICD-10-CM | POA: Diagnosis not present

## 2016-10-08 DIAGNOSIS — B9689 Other specified bacterial agents as the cause of diseases classified elsewhere: Secondary | ICD-10-CM | POA: Diagnosis not present

## 2016-10-08 DIAGNOSIS — K219 Gastro-esophageal reflux disease without esophagitis: Secondary | ICD-10-CM | POA: Diagnosis not present

## 2016-10-19 DIAGNOSIS — Z1231 Encounter for screening mammogram for malignant neoplasm of breast: Secondary | ICD-10-CM | POA: Diagnosis not present

## 2016-11-01 DIAGNOSIS — R69 Illness, unspecified: Secondary | ICD-10-CM | POA: Diagnosis not present

## 2016-11-07 DIAGNOSIS — E119 Type 2 diabetes mellitus without complications: Secondary | ICD-10-CM | POA: Diagnosis not present

## 2016-11-07 DIAGNOSIS — Z87440 Personal history of urinary (tract) infections: Secondary | ICD-10-CM | POA: Diagnosis not present

## 2016-11-07 DIAGNOSIS — K219 Gastro-esophageal reflux disease without esophagitis: Secondary | ICD-10-CM | POA: Diagnosis not present

## 2016-11-07 DIAGNOSIS — M8588 Other specified disorders of bone density and structure, other site: Secondary | ICD-10-CM | POA: Diagnosis not present

## 2016-11-07 DIAGNOSIS — M150 Primary generalized (osteo)arthritis: Secondary | ICD-10-CM | POA: Diagnosis not present

## 2016-11-07 DIAGNOSIS — E559 Vitamin D deficiency, unspecified: Secondary | ICD-10-CM | POA: Diagnosis not present

## 2016-11-07 DIAGNOSIS — M47816 Spondylosis without myelopathy or radiculopathy, lumbar region: Secondary | ICD-10-CM | POA: Diagnosis not present

## 2016-11-07 DIAGNOSIS — M359 Systemic involvement of connective tissue, unspecified: Secondary | ICD-10-CM | POA: Diagnosis not present

## 2016-11-07 DIAGNOSIS — M19041 Primary osteoarthritis, right hand: Secondary | ICD-10-CM | POA: Diagnosis not present

## 2016-11-07 DIAGNOSIS — M1990 Unspecified osteoarthritis, unspecified site: Secondary | ICD-10-CM | POA: Diagnosis not present

## 2016-11-23 DIAGNOSIS — M810 Age-related osteoporosis without current pathological fracture: Secondary | ICD-10-CM | POA: Diagnosis not present

## 2016-11-23 DIAGNOSIS — E559 Vitamin D deficiency, unspecified: Secondary | ICD-10-CM | POA: Diagnosis not present

## 2016-12-05 DIAGNOSIS — Z87440 Personal history of urinary (tract) infections: Secondary | ICD-10-CM | POA: Diagnosis not present

## 2016-12-05 DIAGNOSIS — N390 Urinary tract infection, site not specified: Secondary | ICD-10-CM | POA: Diagnosis not present

## 2016-12-17 DIAGNOSIS — R809 Proteinuria, unspecified: Secondary | ICD-10-CM | POA: Diagnosis not present

## 2016-12-17 DIAGNOSIS — R69 Illness, unspecified: Secondary | ICD-10-CM | POA: Diagnosis not present

## 2016-12-17 DIAGNOSIS — K219 Gastro-esophageal reflux disease without esophagitis: Secondary | ICD-10-CM | POA: Diagnosis not present

## 2016-12-17 DIAGNOSIS — J208 Acute bronchitis due to other specified organisms: Secondary | ICD-10-CM | POA: Diagnosis not present

## 2016-12-17 DIAGNOSIS — B9689 Other specified bacterial agents as the cause of diseases classified elsewhere: Secondary | ICD-10-CM | POA: Diagnosis not present

## 2016-12-17 DIAGNOSIS — E1129 Type 2 diabetes mellitus with other diabetic kidney complication: Secondary | ICD-10-CM | POA: Diagnosis not present

## 2017-01-04 DIAGNOSIS — M8588 Other specified disorders of bone density and structure, other site: Secondary | ICD-10-CM | POA: Diagnosis not present

## 2017-01-04 DIAGNOSIS — N390 Urinary tract infection, site not specified: Secondary | ICD-10-CM | POA: Diagnosis not present

## 2017-01-04 DIAGNOSIS — E559 Vitamin D deficiency, unspecified: Secondary | ICD-10-CM | POA: Diagnosis not present

## 2017-01-29 DIAGNOSIS — N390 Urinary tract infection, site not specified: Secondary | ICD-10-CM | POA: Diagnosis not present

## 2017-02-07 DIAGNOSIS — B9689 Other specified bacterial agents as the cause of diseases classified elsewhere: Secondary | ICD-10-CM | POA: Diagnosis not present

## 2017-02-07 DIAGNOSIS — K219 Gastro-esophageal reflux disease without esophagitis: Secondary | ICD-10-CM | POA: Diagnosis not present

## 2017-02-07 DIAGNOSIS — J208 Acute bronchitis due to other specified organisms: Secondary | ICD-10-CM | POA: Diagnosis not present

## 2017-02-07 DIAGNOSIS — Z6828 Body mass index (BMI) 28.0-28.9, adult: Secondary | ICD-10-CM | POA: Diagnosis not present

## 2017-02-07 DIAGNOSIS — N3021 Other chronic cystitis with hematuria: Secondary | ICD-10-CM | POA: Diagnosis not present

## 2017-02-12 DIAGNOSIS — Z1635 Resistance to multiple antimicrobial drugs: Secondary | ICD-10-CM | POA: Diagnosis not present

## 2017-02-12 DIAGNOSIS — N390 Urinary tract infection, site not specified: Secondary | ICD-10-CM | POA: Diagnosis not present

## 2017-02-12 DIAGNOSIS — N3000 Acute cystitis without hematuria: Secondary | ICD-10-CM | POA: Diagnosis not present

## 2017-02-12 DIAGNOSIS — Z889 Allergy status to unspecified drugs, medicaments and biological substances status: Secondary | ICD-10-CM | POA: Diagnosis not present

## 2017-02-18 DIAGNOSIS — N3000 Acute cystitis without hematuria: Secondary | ICD-10-CM | POA: Diagnosis not present

## 2017-02-19 DIAGNOSIS — N3000 Acute cystitis without hematuria: Secondary | ICD-10-CM | POA: Diagnosis not present

## 2017-02-20 DIAGNOSIS — N3000 Acute cystitis without hematuria: Secondary | ICD-10-CM | POA: Diagnosis not present

## 2017-02-21 DIAGNOSIS — N3000 Acute cystitis without hematuria: Secondary | ICD-10-CM | POA: Diagnosis not present

## 2017-02-22 DIAGNOSIS — N3000 Acute cystitis without hematuria: Secondary | ICD-10-CM | POA: Diagnosis not present

## 2017-02-23 DIAGNOSIS — N3000 Acute cystitis without hematuria: Secondary | ICD-10-CM | POA: Diagnosis not present

## 2017-02-24 DIAGNOSIS — N3000 Acute cystitis without hematuria: Secondary | ICD-10-CM | POA: Diagnosis not present

## 2017-03-12 DIAGNOSIS — N390 Urinary tract infection, site not specified: Secondary | ICD-10-CM | POA: Diagnosis not present

## 2017-03-18 DIAGNOSIS — N3000 Acute cystitis without hematuria: Secondary | ICD-10-CM | POA: Diagnosis not present

## 2017-03-19 DIAGNOSIS — N3000 Acute cystitis without hematuria: Secondary | ICD-10-CM | POA: Diagnosis not present

## 2017-03-20 DIAGNOSIS — N3000 Acute cystitis without hematuria: Secondary | ICD-10-CM | POA: Diagnosis not present

## 2017-03-21 DIAGNOSIS — N3000 Acute cystitis without hematuria: Secondary | ICD-10-CM | POA: Diagnosis not present

## 2017-03-22 DIAGNOSIS — N3000 Acute cystitis without hematuria: Secondary | ICD-10-CM | POA: Diagnosis not present

## 2017-03-23 DIAGNOSIS — N3000 Acute cystitis without hematuria: Secondary | ICD-10-CM | POA: Diagnosis not present

## 2017-03-24 DIAGNOSIS — N3000 Acute cystitis without hematuria: Secondary | ICD-10-CM | POA: Diagnosis not present

## 2017-03-26 DIAGNOSIS — Z1630 Resistance to unspecified antimicrobial drugs: Secondary | ICD-10-CM | POA: Diagnosis not present

## 2017-03-26 DIAGNOSIS — Z881 Allergy status to other antibiotic agents status: Secondary | ICD-10-CM | POA: Diagnosis not present

## 2017-03-26 DIAGNOSIS — N390 Urinary tract infection, site not specified: Secondary | ICD-10-CM | POA: Diagnosis not present

## 2017-04-15 DIAGNOSIS — N200 Calculus of kidney: Secondary | ICD-10-CM | POA: Diagnosis not present

## 2017-04-15 DIAGNOSIS — R69 Illness, unspecified: Secondary | ICD-10-CM | POA: Diagnosis not present

## 2017-04-15 DIAGNOSIS — N390 Urinary tract infection, site not specified: Secondary | ICD-10-CM | POA: Diagnosis not present

## 2017-04-15 DIAGNOSIS — N281 Cyst of kidney, acquired: Secondary | ICD-10-CM | POA: Diagnosis not present

## 2017-04-15 DIAGNOSIS — K5730 Diverticulosis of large intestine without perforation or abscess without bleeding: Secondary | ICD-10-CM | POA: Diagnosis not present

## 2017-04-29 DIAGNOSIS — N390 Urinary tract infection, site not specified: Secondary | ICD-10-CM | POA: Diagnosis not present

## 2017-05-13 DIAGNOSIS — Z6828 Body mass index (BMI) 28.0-28.9, adult: Secondary | ICD-10-CM | POA: Diagnosis not present

## 2017-05-13 DIAGNOSIS — J069 Acute upper respiratory infection, unspecified: Secondary | ICD-10-CM | POA: Diagnosis not present

## 2017-05-13 DIAGNOSIS — H6122 Impacted cerumen, left ear: Secondary | ICD-10-CM | POA: Diagnosis not present

## 2017-05-31 DIAGNOSIS — R69 Illness, unspecified: Secondary | ICD-10-CM | POA: Diagnosis not present

## 2017-06-18 DIAGNOSIS — M1990 Unspecified osteoarthritis, unspecified site: Secondary | ICD-10-CM | POA: Diagnosis not present

## 2017-06-18 DIAGNOSIS — M722 Plantar fascial fibromatosis: Secondary | ICD-10-CM | POA: Diagnosis not present

## 2017-06-18 DIAGNOSIS — M47816 Spondylosis without myelopathy or radiculopathy, lumbar region: Secondary | ICD-10-CM | POA: Diagnosis not present

## 2017-06-18 DIAGNOSIS — M5431 Sciatica, right side: Secondary | ICD-10-CM | POA: Diagnosis not present

## 2017-06-18 DIAGNOSIS — M79672 Pain in left foot: Secondary | ICD-10-CM | POA: Diagnosis not present

## 2017-06-18 DIAGNOSIS — M25562 Pain in left knee: Secondary | ICD-10-CM | POA: Diagnosis not present

## 2017-06-18 DIAGNOSIS — K219 Gastro-esophageal reflux disease without esophagitis: Secondary | ICD-10-CM | POA: Diagnosis not present

## 2017-06-18 DIAGNOSIS — I7090 Unspecified atherosclerosis: Secondary | ICD-10-CM | POA: Diagnosis not present

## 2017-06-18 DIAGNOSIS — M8588 Other specified disorders of bone density and structure, other site: Secondary | ICD-10-CM | POA: Diagnosis not present

## 2017-06-18 DIAGNOSIS — G894 Chronic pain syndrome: Secondary | ICD-10-CM | POA: Diagnosis not present

## 2017-06-18 DIAGNOSIS — E559 Vitamin D deficiency, unspecified: Secondary | ICD-10-CM | POA: Diagnosis not present

## 2017-06-18 DIAGNOSIS — E079 Disorder of thyroid, unspecified: Secondary | ICD-10-CM | POA: Diagnosis not present

## 2017-06-18 DIAGNOSIS — E119 Type 2 diabetes mellitus without complications: Secondary | ICD-10-CM | POA: Diagnosis not present

## 2017-06-18 DIAGNOSIS — M542 Cervicalgia: Secondary | ICD-10-CM | POA: Diagnosis not present

## 2017-06-18 DIAGNOSIS — M7989 Other specified soft tissue disorders: Secondary | ICD-10-CM | POA: Diagnosis not present

## 2017-06-18 DIAGNOSIS — M85872 Other specified disorders of bone density and structure, left ankle and foot: Secondary | ICD-10-CM | POA: Diagnosis not present

## 2017-06-18 DIAGNOSIS — M150 Primary generalized (osteo)arthritis: Secondary | ICD-10-CM | POA: Diagnosis not present

## 2017-06-18 DIAGNOSIS — K5792 Diverticulitis of intestine, part unspecified, without perforation or abscess without bleeding: Secondary | ICD-10-CM | POA: Diagnosis not present

## 2017-06-18 DIAGNOSIS — J45909 Unspecified asthma, uncomplicated: Secondary | ICD-10-CM | POA: Diagnosis not present

## 2017-06-18 DIAGNOSIS — I70202 Unspecified atherosclerosis of native arteries of extremities, left leg: Secondary | ICD-10-CM | POA: Diagnosis not present

## 2017-06-18 DIAGNOSIS — M19042 Primary osteoarthritis, left hand: Secondary | ICD-10-CM | POA: Diagnosis not present

## 2017-06-18 DIAGNOSIS — M359 Systemic involvement of connective tissue, unspecified: Secondary | ICD-10-CM | POA: Diagnosis not present

## 2017-06-18 DIAGNOSIS — M19072 Primary osteoarthritis, left ankle and foot: Secondary | ICD-10-CM | POA: Diagnosis not present

## 2017-06-18 DIAGNOSIS — M19041 Primary osteoarthritis, right hand: Secondary | ICD-10-CM | POA: Diagnosis not present

## 2017-06-19 DIAGNOSIS — M7989 Other specified soft tissue disorders: Secondary | ICD-10-CM | POA: Diagnosis not present

## 2017-06-20 DIAGNOSIS — M0609 Rheumatoid arthritis without rheumatoid factor, multiple sites: Secondary | ICD-10-CM | POA: Diagnosis not present

## 2017-06-20 DIAGNOSIS — Z79899 Other long term (current) drug therapy: Secondary | ICD-10-CM | POA: Diagnosis not present

## 2017-06-20 DIAGNOSIS — Z7984 Long term (current) use of oral hypoglycemic drugs: Secondary | ICD-10-CM | POA: Diagnosis not present

## 2017-06-20 DIAGNOSIS — E113291 Type 2 diabetes mellitus with mild nonproliferative diabetic retinopathy without macular edema, right eye: Secondary | ICD-10-CM | POA: Diagnosis not present

## 2017-06-21 DIAGNOSIS — N390 Urinary tract infection, site not specified: Secondary | ICD-10-CM | POA: Diagnosis not present

## 2017-06-25 DIAGNOSIS — M150 Primary generalized (osteo)arthritis: Secondary | ICD-10-CM | POA: Diagnosis not present

## 2017-06-28 DIAGNOSIS — Z1159 Encounter for screening for other viral diseases: Secondary | ICD-10-CM | POA: Diagnosis not present

## 2017-06-28 DIAGNOSIS — E1129 Type 2 diabetes mellitus with other diabetic kidney complication: Secondary | ICD-10-CM | POA: Diagnosis not present

## 2017-06-28 DIAGNOSIS — Z79899 Other long term (current) drug therapy: Secondary | ICD-10-CM | POA: Diagnosis not present

## 2017-06-28 DIAGNOSIS — R809 Proteinuria, unspecified: Secondary | ICD-10-CM | POA: Diagnosis not present

## 2017-06-28 DIAGNOSIS — J4530 Mild persistent asthma, uncomplicated: Secondary | ICD-10-CM | POA: Diagnosis not present

## 2017-06-28 DIAGNOSIS — M109 Gout, unspecified: Secondary | ICD-10-CM | POA: Diagnosis not present

## 2017-06-28 DIAGNOSIS — Z0000 Encounter for general adult medical examination without abnormal findings: Secondary | ICD-10-CM | POA: Diagnosis not present

## 2017-06-28 DIAGNOSIS — E785 Hyperlipidemia, unspecified: Secondary | ICD-10-CM | POA: Diagnosis not present

## 2017-06-28 DIAGNOSIS — Z9181 History of falling: Secondary | ICD-10-CM | POA: Diagnosis not present

## 2017-06-28 DIAGNOSIS — N3021 Other chronic cystitis with hematuria: Secondary | ICD-10-CM | POA: Diagnosis not present

## 2017-08-16 DIAGNOSIS — M47816 Spondylosis without myelopathy or radiculopathy, lumbar region: Secondary | ICD-10-CM | POA: Diagnosis not present

## 2017-08-16 DIAGNOSIS — M19041 Primary osteoarthritis, right hand: Secondary | ICD-10-CM | POA: Diagnosis not present

## 2017-08-16 DIAGNOSIS — M359 Systemic involvement of connective tissue, unspecified: Secondary | ICD-10-CM | POA: Diagnosis not present

## 2017-08-16 DIAGNOSIS — M1990 Unspecified osteoarthritis, unspecified site: Secondary | ICD-10-CM | POA: Diagnosis not present

## 2017-08-16 DIAGNOSIS — M8588 Other specified disorders of bone density and structure, other site: Secondary | ICD-10-CM | POA: Diagnosis not present

## 2017-08-16 DIAGNOSIS — M150 Primary generalized (osteo)arthritis: Secondary | ICD-10-CM | POA: Diagnosis not present

## 2017-08-16 DIAGNOSIS — E119 Type 2 diabetes mellitus without complications: Secondary | ICD-10-CM | POA: Diagnosis not present

## 2017-08-16 DIAGNOSIS — E559 Vitamin D deficiency, unspecified: Secondary | ICD-10-CM | POA: Diagnosis not present

## 2017-08-16 DIAGNOSIS — M722 Plantar fascial fibromatosis: Secondary | ICD-10-CM | POA: Diagnosis not present

## 2017-08-16 DIAGNOSIS — M25472 Effusion, left ankle: Secondary | ICD-10-CM | POA: Diagnosis not present

## 2017-08-22 DIAGNOSIS — N390 Urinary tract infection, site not specified: Secondary | ICD-10-CM | POA: Diagnosis not present

## 2017-08-23 DIAGNOSIS — M722 Plantar fascial fibromatosis: Secondary | ICD-10-CM | POA: Diagnosis not present

## 2017-08-23 DIAGNOSIS — M25572 Pain in left ankle and joints of left foot: Secondary | ICD-10-CM | POA: Diagnosis not present

## 2017-08-23 DIAGNOSIS — M25472 Effusion, left ankle: Secondary | ICD-10-CM | POA: Diagnosis not present

## 2017-08-23 DIAGNOSIS — M150 Primary generalized (osteo)arthritis: Secondary | ICD-10-CM | POA: Diagnosis not present

## 2017-08-23 DIAGNOSIS — M659 Synovitis and tenosynovitis, unspecified: Secondary | ICD-10-CM | POA: Diagnosis not present

## 2017-08-23 DIAGNOSIS — M7732 Calcaneal spur, left foot: Secondary | ICD-10-CM | POA: Diagnosis not present

## 2017-08-23 DIAGNOSIS — M1990 Unspecified osteoarthritis, unspecified site: Secondary | ICD-10-CM | POA: Diagnosis not present

## 2017-08-26 DIAGNOSIS — R69 Illness, unspecified: Secondary | ICD-10-CM | POA: Diagnosis not present

## 2017-09-25 DIAGNOSIS — N390 Urinary tract infection, site not specified: Secondary | ICD-10-CM | POA: Diagnosis not present

## 2017-10-08 DIAGNOSIS — N390 Urinary tract infection, site not specified: Secondary | ICD-10-CM | POA: Diagnosis not present

## 2017-10-28 DIAGNOSIS — J208 Acute bronchitis due to other specified organisms: Secondary | ICD-10-CM | POA: Diagnosis not present

## 2017-10-28 DIAGNOSIS — Z6828 Body mass index (BMI) 28.0-28.9, adult: Secondary | ICD-10-CM | POA: Diagnosis not present

## 2017-11-07 DIAGNOSIS — R69 Illness, unspecified: Secondary | ICD-10-CM | POA: Diagnosis not present

## 2017-11-29 DIAGNOSIS — Z4422 Encounter for fitting and adjustment of artificial left eye: Secondary | ICD-10-CM | POA: Diagnosis not present

## 2017-12-02 DIAGNOSIS — H10023 Other mucopurulent conjunctivitis, bilateral: Secondary | ICD-10-CM | POA: Diagnosis not present

## 2017-12-11 DIAGNOSIS — N390 Urinary tract infection, site not specified: Secondary | ICD-10-CM | POA: Diagnosis not present

## 2017-12-11 DIAGNOSIS — R319 Hematuria, unspecified: Secondary | ICD-10-CM | POA: Diagnosis not present

## 2017-12-11 DIAGNOSIS — R1033 Periumbilical pain: Secondary | ICD-10-CM | POA: Diagnosis not present

## 2017-12-11 DIAGNOSIS — R102 Pelvic and perineal pain: Secondary | ICD-10-CM | POA: Diagnosis not present

## 2017-12-16 DIAGNOSIS — R69 Illness, unspecified: Secondary | ICD-10-CM | POA: Diagnosis not present

## 2017-12-26 DIAGNOSIS — H01002 Unspecified blepharitis right lower eyelid: Secondary | ICD-10-CM | POA: Diagnosis not present

## 2017-12-26 DIAGNOSIS — H01005 Unspecified blepharitis left lower eyelid: Secondary | ICD-10-CM | POA: Diagnosis not present

## 2017-12-31 DIAGNOSIS — M150 Primary generalized (osteo)arthritis: Secondary | ICD-10-CM | POA: Diagnosis not present

## 2017-12-31 DIAGNOSIS — N3000 Acute cystitis without hematuria: Secondary | ICD-10-CM | POA: Diagnosis not present

## 2017-12-31 DIAGNOSIS — M722 Plantar fascial fibromatosis: Secondary | ICD-10-CM | POA: Diagnosis not present

## 2017-12-31 DIAGNOSIS — M359 Systemic involvement of connective tissue, unspecified: Secondary | ICD-10-CM | POA: Diagnosis not present

## 2017-12-31 DIAGNOSIS — E119 Type 2 diabetes mellitus without complications: Secondary | ICD-10-CM | POA: Diagnosis not present

## 2017-12-31 DIAGNOSIS — M47816 Spondylosis without myelopathy or radiculopathy, lumbar region: Secondary | ICD-10-CM | POA: Diagnosis not present

## 2017-12-31 DIAGNOSIS — B373 Candidiasis of vulva and vagina: Secondary | ICD-10-CM | POA: Diagnosis not present

## 2017-12-31 DIAGNOSIS — M19041 Primary osteoarthritis, right hand: Secondary | ICD-10-CM | POA: Diagnosis not present

## 2017-12-31 DIAGNOSIS — M1990 Unspecified osteoarthritis, unspecified site: Secondary | ICD-10-CM | POA: Diagnosis not present

## 2017-12-31 DIAGNOSIS — M8588 Other specified disorders of bone density and structure, other site: Secondary | ICD-10-CM | POA: Diagnosis not present

## 2017-12-31 DIAGNOSIS — E559 Vitamin D deficiency, unspecified: Secondary | ICD-10-CM | POA: Diagnosis not present

## 2018-01-06 DIAGNOSIS — N390 Urinary tract infection, site not specified: Secondary | ICD-10-CM | POA: Diagnosis not present

## 2018-01-06 DIAGNOSIS — B373 Candidiasis of vulva and vagina: Secondary | ICD-10-CM | POA: Diagnosis not present

## 2018-01-08 DIAGNOSIS — N3001 Acute cystitis with hematuria: Secondary | ICD-10-CM | POA: Diagnosis not present

## 2018-01-09 DIAGNOSIS — N3001 Acute cystitis with hematuria: Secondary | ICD-10-CM | POA: Diagnosis not present

## 2018-01-10 DIAGNOSIS — N3001 Acute cystitis with hematuria: Secondary | ICD-10-CM | POA: Diagnosis not present

## 2018-01-11 DIAGNOSIS — N3001 Acute cystitis with hematuria: Secondary | ICD-10-CM | POA: Diagnosis not present

## 2018-01-12 DIAGNOSIS — N3001 Acute cystitis with hematuria: Secondary | ICD-10-CM | POA: Diagnosis not present

## 2018-01-13 DIAGNOSIS — N3001 Acute cystitis with hematuria: Secondary | ICD-10-CM | POA: Diagnosis not present

## 2018-01-14 DIAGNOSIS — N3001 Acute cystitis with hematuria: Secondary | ICD-10-CM | POA: Diagnosis not present

## 2018-01-16 DIAGNOSIS — H01002 Unspecified blepharitis right lower eyelid: Secondary | ICD-10-CM | POA: Diagnosis not present

## 2018-01-16 DIAGNOSIS — H01005 Unspecified blepharitis left lower eyelid: Secondary | ICD-10-CM | POA: Diagnosis not present

## 2018-01-16 DIAGNOSIS — N390 Urinary tract infection, site not specified: Secondary | ICD-10-CM | POA: Diagnosis not present

## 2018-01-21 DIAGNOSIS — N390 Urinary tract infection, site not specified: Secondary | ICD-10-CM | POA: Diagnosis not present

## 2018-02-26 DIAGNOSIS — S3992XA Unspecified injury of lower back, initial encounter: Secondary | ICD-10-CM | POA: Diagnosis not present

## 2018-02-26 DIAGNOSIS — Z6828 Body mass index (BMI) 28.0-28.9, adult: Secondary | ICD-10-CM | POA: Diagnosis not present

## 2018-02-26 DIAGNOSIS — M549 Dorsalgia, unspecified: Secondary | ICD-10-CM | POA: Diagnosis not present

## 2018-03-01 DIAGNOSIS — M545 Low back pain: Secondary | ICD-10-CM | POA: Diagnosis not present

## 2018-03-01 DIAGNOSIS — R300 Dysuria: Secondary | ICD-10-CM | POA: Diagnosis not present

## 2018-03-01 DIAGNOSIS — N3091 Cystitis, unspecified with hematuria: Secondary | ICD-10-CM | POA: Diagnosis not present

## 2018-03-04 DIAGNOSIS — N390 Urinary tract infection, site not specified: Secondary | ICD-10-CM | POA: Diagnosis not present

## 2018-03-13 DIAGNOSIS — E1129 Type 2 diabetes mellitus with other diabetic kidney complication: Secondary | ICD-10-CM | POA: Diagnosis not present

## 2018-03-13 DIAGNOSIS — Z6828 Body mass index (BMI) 28.0-28.9, adult: Secondary | ICD-10-CM | POA: Diagnosis not present

## 2018-03-13 DIAGNOSIS — M549 Dorsalgia, unspecified: Secondary | ICD-10-CM | POA: Diagnosis not present

## 2018-03-13 DIAGNOSIS — R809 Proteinuria, unspecified: Secondary | ICD-10-CM | POA: Diagnosis not present

## 2018-03-13 DIAGNOSIS — R69 Illness, unspecified: Secondary | ICD-10-CM | POA: Diagnosis not present

## 2018-03-17 DIAGNOSIS — D519 Vitamin B12 deficiency anemia, unspecified: Secondary | ICD-10-CM | POA: Diagnosis not present

## 2018-03-17 DIAGNOSIS — E559 Vitamin D deficiency, unspecified: Secondary | ICD-10-CM | POA: Diagnosis not present

## 2018-03-17 DIAGNOSIS — E119 Type 2 diabetes mellitus without complications: Secondary | ICD-10-CM | POA: Diagnosis not present

## 2018-03-17 DIAGNOSIS — Z79899 Other long term (current) drug therapy: Secondary | ICD-10-CM | POA: Diagnosis not present

## 2018-03-21 DIAGNOSIS — D510 Vitamin B12 deficiency anemia due to intrinsic factor deficiency: Secondary | ICD-10-CM | POA: Diagnosis not present

## 2018-04-15 DIAGNOSIS — N390 Urinary tract infection, site not specified: Secondary | ICD-10-CM | POA: Diagnosis not present

## 2018-04-22 DIAGNOSIS — D510 Vitamin B12 deficiency anemia due to intrinsic factor deficiency: Secondary | ICD-10-CM | POA: Diagnosis not present

## 2018-05-12 DIAGNOSIS — H04123 Dry eye syndrome of bilateral lacrimal glands: Secondary | ICD-10-CM | POA: Diagnosis not present

## 2018-05-13 DIAGNOSIS — Z6828 Body mass index (BMI) 28.0-28.9, adult: Secondary | ICD-10-CM | POA: Diagnosis not present

## 2018-05-13 DIAGNOSIS — E538 Deficiency of other specified B group vitamins: Secondary | ICD-10-CM | POA: Diagnosis not present

## 2018-05-13 DIAGNOSIS — E559 Vitamin D deficiency, unspecified: Secondary | ICD-10-CM | POA: Diagnosis not present

## 2018-05-13 DIAGNOSIS — L989 Disorder of the skin and subcutaneous tissue, unspecified: Secondary | ICD-10-CM | POA: Diagnosis not present

## 2018-05-13 DIAGNOSIS — M0579 Rheumatoid arthritis with rheumatoid factor of multiple sites without organ or systems involvement: Secondary | ICD-10-CM | POA: Diagnosis not present

## 2018-05-13 DIAGNOSIS — Z1339 Encounter for screening examination for other mental health and behavioral disorders: Secondary | ICD-10-CM | POA: Diagnosis not present

## 2018-05-26 DIAGNOSIS — M722 Plantar fascial fibromatosis: Secondary | ICD-10-CM | POA: Diagnosis not present

## 2018-05-26 DIAGNOSIS — E119 Type 2 diabetes mellitus without complications: Secondary | ICD-10-CM | POA: Diagnosis not present

## 2018-05-26 DIAGNOSIS — M359 Systemic involvement of connective tissue, unspecified: Secondary | ICD-10-CM | POA: Diagnosis not present

## 2018-05-26 DIAGNOSIS — J4540 Moderate persistent asthma, uncomplicated: Secondary | ICD-10-CM | POA: Diagnosis not present

## 2018-05-26 DIAGNOSIS — M8588 Other specified disorders of bone density and structure, other site: Secondary | ICD-10-CM | POA: Diagnosis not present

## 2018-05-26 DIAGNOSIS — E559 Vitamin D deficiency, unspecified: Secondary | ICD-10-CM | POA: Diagnosis not present

## 2018-05-26 DIAGNOSIS — M47816 Spondylosis without myelopathy or radiculopathy, lumbar region: Secondary | ICD-10-CM | POA: Diagnosis not present

## 2018-05-26 DIAGNOSIS — Z79899 Other long term (current) drug therapy: Secondary | ICD-10-CM | POA: Diagnosis not present

## 2018-05-26 DIAGNOSIS — K219 Gastro-esophageal reflux disease without esophagitis: Secondary | ICD-10-CM | POA: Diagnosis not present

## 2018-05-26 DIAGNOSIS — M19041 Primary osteoarthritis, right hand: Secondary | ICD-10-CM | POA: Diagnosis not present

## 2018-05-30 DIAGNOSIS — T148XXA Other injury of unspecified body region, initial encounter: Secondary | ICD-10-CM | POA: Diagnosis not present

## 2018-05-30 DIAGNOSIS — L089 Local infection of the skin and subcutaneous tissue, unspecified: Secondary | ICD-10-CM | POA: Diagnosis not present

## 2018-05-30 DIAGNOSIS — Z23 Encounter for immunization: Secondary | ICD-10-CM | POA: Diagnosis not present

## 2018-05-30 DIAGNOSIS — Z6828 Body mass index (BMI) 28.0-28.9, adult: Secondary | ICD-10-CM | POA: Diagnosis not present

## 2018-06-02 DIAGNOSIS — N390 Urinary tract infection, site not specified: Secondary | ICD-10-CM | POA: Diagnosis not present

## 2018-06-02 DIAGNOSIS — Z87440 Personal history of urinary (tract) infections: Secondary | ICD-10-CM | POA: Diagnosis not present

## 2018-06-12 DIAGNOSIS — E559 Vitamin D deficiency, unspecified: Secondary | ICD-10-CM | POA: Diagnosis not present

## 2018-06-12 DIAGNOSIS — M0579 Rheumatoid arthritis with rheumatoid factor of multiple sites without organ or systems involvement: Secondary | ICD-10-CM | POA: Diagnosis not present

## 2018-06-12 DIAGNOSIS — E538 Deficiency of other specified B group vitamins: Secondary | ICD-10-CM | POA: Diagnosis not present

## 2018-06-12 DIAGNOSIS — Z6828 Body mass index (BMI) 28.0-28.9, adult: Secondary | ICD-10-CM | POA: Diagnosis not present

## 2018-06-17 DIAGNOSIS — H52221 Regular astigmatism, right eye: Secondary | ICD-10-CM | POA: Diagnosis not present

## 2018-07-04 DIAGNOSIS — R69 Illness, unspecified: Secondary | ICD-10-CM | POA: Diagnosis not present

## 2018-07-17 DIAGNOSIS — N390 Urinary tract infection, site not specified: Secondary | ICD-10-CM | POA: Diagnosis not present

## 2018-07-17 DIAGNOSIS — Z87440 Personal history of urinary (tract) infections: Secondary | ICD-10-CM | POA: Diagnosis not present

## 2018-07-21 DIAGNOSIS — L821 Other seborrheic keratosis: Secondary | ICD-10-CM | POA: Diagnosis not present

## 2018-07-21 DIAGNOSIS — D1801 Hemangioma of skin and subcutaneous tissue: Secondary | ICD-10-CM | POA: Diagnosis not present

## 2018-07-21 DIAGNOSIS — C4441 Basal cell carcinoma of skin of scalp and neck: Secondary | ICD-10-CM | POA: Diagnosis not present

## 2018-08-19 DIAGNOSIS — K219 Gastro-esophageal reflux disease without esophagitis: Secondary | ICD-10-CM | POA: Diagnosis not present

## 2018-08-19 DIAGNOSIS — R809 Proteinuria, unspecified: Secondary | ICD-10-CM | POA: Diagnosis not present

## 2018-08-19 DIAGNOSIS — E538 Deficiency of other specified B group vitamins: Secondary | ICD-10-CM | POA: Diagnosis not present

## 2018-08-19 DIAGNOSIS — J208 Acute bronchitis due to other specified organisms: Secondary | ICD-10-CM | POA: Diagnosis not present

## 2018-08-19 DIAGNOSIS — Z1331 Encounter for screening for depression: Secondary | ICD-10-CM | POA: Diagnosis not present

## 2018-08-19 DIAGNOSIS — Z0000 Encounter for general adult medical examination without abnormal findings: Secondary | ICD-10-CM | POA: Diagnosis not present

## 2018-08-19 DIAGNOSIS — M0579 Rheumatoid arthritis with rheumatoid factor of multiple sites without organ or systems involvement: Secondary | ICD-10-CM | POA: Diagnosis not present

## 2018-08-19 DIAGNOSIS — E785 Hyperlipidemia, unspecified: Secondary | ICD-10-CM | POA: Diagnosis not present

## 2018-08-19 DIAGNOSIS — E1129 Type 2 diabetes mellitus with other diabetic kidney complication: Secondary | ICD-10-CM | POA: Diagnosis not present

## 2018-08-19 DIAGNOSIS — Z79899 Other long term (current) drug therapy: Secondary | ICD-10-CM | POA: Diagnosis not present

## 2018-08-19 DIAGNOSIS — R05 Cough: Secondary | ICD-10-CM | POA: Diagnosis not present

## 2018-08-22 DIAGNOSIS — R7989 Other specified abnormal findings of blood chemistry: Secondary | ICD-10-CM | POA: Diagnosis not present

## 2018-08-22 DIAGNOSIS — R946 Abnormal results of thyroid function studies: Secondary | ICD-10-CM | POA: Diagnosis not present

## 2018-08-26 DIAGNOSIS — M1990 Unspecified osteoarthritis, unspecified site: Secondary | ICD-10-CM | POA: Diagnosis not present

## 2018-08-26 DIAGNOSIS — E119 Type 2 diabetes mellitus without complications: Secondary | ICD-10-CM | POA: Diagnosis not present

## 2018-08-26 DIAGNOSIS — M19041 Primary osteoarthritis, right hand: Secondary | ICD-10-CM | POA: Diagnosis not present

## 2018-08-26 DIAGNOSIS — M359 Systemic involvement of connective tissue, unspecified: Secondary | ICD-10-CM | POA: Diagnosis not present

## 2018-08-26 DIAGNOSIS — M79672 Pain in left foot: Secondary | ICD-10-CM | POA: Diagnosis not present

## 2018-08-26 DIAGNOSIS — E559 Vitamin D deficiency, unspecified: Secondary | ICD-10-CM | POA: Diagnosis not present

## 2018-08-26 DIAGNOSIS — E538 Deficiency of other specified B group vitamins: Secondary | ICD-10-CM | POA: Diagnosis not present

## 2018-08-26 DIAGNOSIS — M47816 Spondylosis without myelopathy or radiculopathy, lumbar region: Secondary | ICD-10-CM | POA: Diagnosis not present

## 2018-08-26 DIAGNOSIS — G894 Chronic pain syndrome: Secondary | ICD-10-CM | POA: Diagnosis not present

## 2018-08-26 DIAGNOSIS — M8588 Other specified disorders of bone density and structure, other site: Secondary | ICD-10-CM | POA: Diagnosis not present

## 2018-09-07 DIAGNOSIS — B9689 Other specified bacterial agents as the cause of diseases classified elsewhere: Secondary | ICD-10-CM | POA: Diagnosis not present

## 2018-09-07 DIAGNOSIS — N390 Urinary tract infection, site not specified: Secondary | ICD-10-CM | POA: Diagnosis not present

## 2018-09-07 DIAGNOSIS — K5730 Diverticulosis of large intestine without perforation or abscess without bleeding: Secondary | ICD-10-CM | POA: Diagnosis not present

## 2018-09-07 DIAGNOSIS — R1084 Generalized abdominal pain: Secondary | ICD-10-CM | POA: Diagnosis not present

## 2018-09-08 DIAGNOSIS — R946 Abnormal results of thyroid function studies: Secondary | ICD-10-CM | POA: Diagnosis not present

## 2018-09-08 DIAGNOSIS — E042 Nontoxic multinodular goiter: Secondary | ICD-10-CM | POA: Diagnosis not present

## 2018-09-08 DIAGNOSIS — R7989 Other specified abnormal findings of blood chemistry: Secondary | ICD-10-CM | POA: Diagnosis not present

## 2018-09-15 DIAGNOSIS — N390 Urinary tract infection, site not specified: Secondary | ICD-10-CM | POA: Diagnosis not present

## 2018-09-22 DIAGNOSIS — N390 Urinary tract infection, site not specified: Secondary | ICD-10-CM | POA: Diagnosis not present

## 2018-09-29 DIAGNOSIS — R69 Illness, unspecified: Secondary | ICD-10-CM | POA: Diagnosis not present

## 2018-10-29 DIAGNOSIS — N390 Urinary tract infection, site not specified: Secondary | ICD-10-CM | POA: Diagnosis not present

## 2018-11-21 DIAGNOSIS — K644 Residual hemorrhoidal skin tags: Secondary | ICD-10-CM | POA: Diagnosis not present

## 2018-11-21 DIAGNOSIS — E0590 Thyrotoxicosis, unspecified without thyrotoxic crisis or storm: Secondary | ICD-10-CM | POA: Diagnosis not present

## 2018-11-21 DIAGNOSIS — J40 Bronchitis, not specified as acute or chronic: Secondary | ICD-10-CM | POA: Diagnosis not present

## 2018-11-21 DIAGNOSIS — E559 Vitamin D deficiency, unspecified: Secondary | ICD-10-CM | POA: Diagnosis not present

## 2018-11-21 DIAGNOSIS — J329 Chronic sinusitis, unspecified: Secondary | ICD-10-CM | POA: Diagnosis not present

## 2019-01-08 DIAGNOSIS — R1033 Periumbilical pain: Secondary | ICD-10-CM | POA: Diagnosis not present

## 2019-01-12 DIAGNOSIS — R69 Illness, unspecified: Secondary | ICD-10-CM | POA: Diagnosis not present

## 2019-01-13 DIAGNOSIS — R1033 Periumbilical pain: Secondary | ICD-10-CM | POA: Diagnosis not present

## 2019-01-14 DIAGNOSIS — R1033 Periumbilical pain: Secondary | ICD-10-CM | POA: Diagnosis not present

## 2019-01-20 ENCOUNTER — Telehealth: Payer: Self-pay | Admitting: Gastroenterology

## 2019-01-20 NOTE — Telephone Encounter (Signed)
Will try to reach patient again later.

## 2019-01-28 DIAGNOSIS — N952 Postmenopausal atrophic vaginitis: Secondary | ICD-10-CM | POA: Diagnosis not present

## 2019-01-28 DIAGNOSIS — N390 Urinary tract infection, site not specified: Secondary | ICD-10-CM | POA: Diagnosis not present

## 2019-01-28 DIAGNOSIS — N816 Rectocele: Secondary | ICD-10-CM | POA: Diagnosis not present

## 2019-02-03 NOTE — Telephone Encounter (Signed)
Called and spoke with pt regardig referral as Dr. Carlos Levering office called to f/u. Pt stated that she will call back to schedule.

## 2019-02-06 NOTE — Telephone Encounter (Signed)
Called phone number listed for patient in referral no answer and no voicemail.

## 2019-02-12 DIAGNOSIS — Z6829 Body mass index (BMI) 29.0-29.9, adult: Secondary | ICD-10-CM | POA: Diagnosis not present

## 2019-02-12 DIAGNOSIS — J329 Chronic sinusitis, unspecified: Secondary | ICD-10-CM | POA: Diagnosis not present

## 2019-02-12 DIAGNOSIS — J40 Bronchitis, not specified as acute or chronic: Secondary | ICD-10-CM | POA: Diagnosis not present

## 2019-02-12 DIAGNOSIS — E559 Vitamin D deficiency, unspecified: Secondary | ICD-10-CM | POA: Diagnosis not present

## 2019-04-08 DIAGNOSIS — H0101A Ulcerative blepharitis right eye, upper and lower eyelids: Secondary | ICD-10-CM | POA: Diagnosis not present

## 2019-04-08 DIAGNOSIS — H0101B Ulcerative blepharitis left eye, upper and lower eyelids: Secondary | ICD-10-CM | POA: Diagnosis not present

## 2019-04-13 DIAGNOSIS — R69 Illness, unspecified: Secondary | ICD-10-CM | POA: Diagnosis not present

## 2019-04-27 ENCOUNTER — Encounter: Payer: Self-pay | Admitting: Surgery

## 2019-05-04 DIAGNOSIS — H02402 Unspecified ptosis of left eyelid: Secondary | ICD-10-CM | POA: Diagnosis not present

## 2019-05-04 DIAGNOSIS — H02831 Dermatochalasis of right upper eyelid: Secondary | ICD-10-CM | POA: Diagnosis not present

## 2019-05-13 DIAGNOSIS — R1010 Upper abdominal pain, unspecified: Secondary | ICD-10-CM | POA: Diagnosis not present

## 2019-05-27 NOTE — Telephone Encounter (Signed)
Received referral from Dr. Carlos Levering office for possible endo.  Called number listed in referral, no answer and no voicemail

## 2019-06-02 DIAGNOSIS — R112 Nausea with vomiting, unspecified: Secondary | ICD-10-CM | POA: Diagnosis not present

## 2019-06-02 DIAGNOSIS — R1013 Epigastric pain: Secondary | ICD-10-CM | POA: Diagnosis not present

## 2019-06-03 DIAGNOSIS — R1013 Epigastric pain: Secondary | ICD-10-CM | POA: Diagnosis not present

## 2019-06-04 DIAGNOSIS — E119 Type 2 diabetes mellitus without complications: Secondary | ICD-10-CM | POA: Diagnosis not present

## 2019-06-04 DIAGNOSIS — M8588 Other specified disorders of bone density and structure, other site: Secondary | ICD-10-CM | POA: Diagnosis not present

## 2019-06-04 DIAGNOSIS — E559 Vitamin D deficiency, unspecified: Secondary | ICD-10-CM | POA: Diagnosis not present

## 2019-06-04 DIAGNOSIS — G894 Chronic pain syndrome: Secondary | ICD-10-CM | POA: Diagnosis not present

## 2019-06-04 DIAGNOSIS — M19041 Primary osteoarthritis, right hand: Secondary | ICD-10-CM | POA: Diagnosis not present

## 2019-06-04 DIAGNOSIS — M79672 Pain in left foot: Secondary | ICD-10-CM | POA: Diagnosis not present

## 2019-06-04 DIAGNOSIS — M359 Systemic involvement of connective tissue, unspecified: Secondary | ICD-10-CM | POA: Diagnosis not present

## 2019-06-04 DIAGNOSIS — M47816 Spondylosis without myelopathy or radiculopathy, lumbar region: Secondary | ICD-10-CM | POA: Diagnosis not present

## 2019-06-04 DIAGNOSIS — M722 Plantar fascial fibromatosis: Secondary | ICD-10-CM | POA: Diagnosis not present

## 2019-06-04 DIAGNOSIS — E079 Disorder of thyroid, unspecified: Secondary | ICD-10-CM | POA: Diagnosis not present

## 2019-06-04 DIAGNOSIS — K219 Gastro-esophageal reflux disease without esophagitis: Secondary | ICD-10-CM | POA: Diagnosis not present

## 2019-06-08 DIAGNOSIS — K219 Gastro-esophageal reflux disease without esophagitis: Secondary | ICD-10-CM | POA: Diagnosis not present

## 2019-06-08 DIAGNOSIS — R1013 Epigastric pain: Secondary | ICD-10-CM | POA: Diagnosis not present

## 2019-06-08 DIAGNOSIS — K2900 Acute gastritis without bleeding: Secondary | ICD-10-CM | POA: Diagnosis not present

## 2019-06-08 DIAGNOSIS — K3189 Other diseases of stomach and duodenum: Secondary | ICD-10-CM | POA: Diagnosis not present

## 2019-06-10 DIAGNOSIS — R1013 Epigastric pain: Secondary | ICD-10-CM | POA: Diagnosis not present

## 2019-06-22 DIAGNOSIS — E1129 Type 2 diabetes mellitus with other diabetic kidney complication: Secondary | ICD-10-CM | POA: Diagnosis not present

## 2019-06-22 DIAGNOSIS — R7989 Other specified abnormal findings of blood chemistry: Secondary | ICD-10-CM | POA: Diagnosis not present

## 2019-06-22 DIAGNOSIS — J4530 Mild persistent asthma, uncomplicated: Secondary | ICD-10-CM | POA: Diagnosis not present

## 2019-06-22 DIAGNOSIS — R809 Proteinuria, unspecified: Secondary | ICD-10-CM | POA: Diagnosis not present

## 2019-06-22 DIAGNOSIS — D72819 Decreased white blood cell count, unspecified: Secondary | ICD-10-CM | POA: Diagnosis not present

## 2019-06-22 DIAGNOSIS — Z6827 Body mass index (BMI) 27.0-27.9, adult: Secondary | ICD-10-CM | POA: Diagnosis not present

## 2019-06-27 DIAGNOSIS — U071 COVID-19: Secondary | ICD-10-CM | POA: Diagnosis not present

## 2019-07-03 ENCOUNTER — Emergency Department (HOSPITAL_COMMUNITY): Payer: Medicare HMO

## 2019-07-03 ENCOUNTER — Emergency Department (HOSPITAL_COMMUNITY)
Admission: EM | Admit: 2019-07-03 | Discharge: 2019-07-03 | Disposition: A | Discharge status: Home / Self Care | Payer: Medicare HMO | Attending: Emergency Medicine | Admitting: Emergency Medicine

## 2019-07-03 ENCOUNTER — Other Ambulatory Visit: Payer: Self-pay

## 2019-07-03 ENCOUNTER — Encounter (HOSPITAL_COMMUNITY): Payer: Self-pay

## 2019-07-03 DIAGNOSIS — R05 Cough: Secondary | ICD-10-CM | POA: Diagnosis not present

## 2019-07-03 DIAGNOSIS — Z7984 Long term (current) use of oral hypoglycemic drugs: Secondary | ICD-10-CM | POA: Diagnosis not present

## 2019-07-03 DIAGNOSIS — R0602 Shortness of breath: Secondary | ICD-10-CM | POA: Diagnosis present

## 2019-07-03 DIAGNOSIS — J45909 Unspecified asthma, uncomplicated: Secondary | ICD-10-CM | POA: Diagnosis not present

## 2019-07-03 DIAGNOSIS — J1282 Pneumonia due to coronavirus disease 2019: Secondary | ICD-10-CM

## 2019-07-03 DIAGNOSIS — U071 COVID-19: Secondary | ICD-10-CM

## 2019-07-03 DIAGNOSIS — Z79899 Other long term (current) drug therapy: Secondary | ICD-10-CM | POA: Insufficient documentation

## 2019-07-03 DIAGNOSIS — J1289 Other viral pneumonia: Secondary | ICD-10-CM | POA: Diagnosis not present

## 2019-07-03 DIAGNOSIS — J189 Pneumonia, unspecified organism: Secondary | ICD-10-CM | POA: Diagnosis not present

## 2019-07-03 DIAGNOSIS — E119 Type 2 diabetes mellitus without complications: Secondary | ICD-10-CM | POA: Diagnosis not present

## 2019-07-03 DIAGNOSIS — R000 Tachycardia, unspecified: Secondary | ICD-10-CM | POA: Diagnosis not present

## 2019-07-03 LAB — CBC
HCT: 34.1 % — ABNORMAL LOW (ref 36.0–46.0)
Hemoglobin: 10.9 g/dL — ABNORMAL LOW (ref 12.0–15.0)
MCH: 30.5 pg (ref 26.0–34.0)
MCHC: 32 g/dL (ref 30.0–36.0)
MCV: 95.5 fL (ref 80.0–100.0)
Platelets: 161 10*3/uL (ref 150–400)
RBC: 3.57 MIL/uL — ABNORMAL LOW (ref 3.87–5.11)
RDW: 12.6 % (ref 11.5–15.5)
WBC: 4.7 10*3/uL (ref 4.0–10.5)
nRBC: 0 % (ref 0.0–0.2)

## 2019-07-03 LAB — BASIC METABOLIC PANEL
Anion gap: 10 (ref 5–15)
BUN: 21 mg/dL (ref 8–23)
CO2: 26 mmol/L (ref 22–32)
Calcium: 9 mg/dL (ref 8.9–10.3)
Chloride: 104 mmol/L (ref 98–111)
Creatinine, Ser: 1.23 mg/dL — ABNORMAL HIGH (ref 0.44–1.00)
GFR calc Af Amer: 51 mL/min — ABNORMAL LOW (ref >60)
GFR calc non Af Amer: 44 mL/min — ABNORMAL LOW (ref >60)
Glucose, Bld: 124 mg/dL — ABNORMAL HIGH (ref 70–99)
Potassium: 4.1 mmol/L (ref 3.5–5.1)
Sodium: 140 mmol/L (ref 135–145)

## 2019-07-03 MED ORDER — SODIUM CHLORIDE 0.9% FLUSH: 10.0000 mL | Freq: Two times a day (BID) | INTRAVENOUS | Status: DC

## 2019-07-03 MED ORDER — ONDANSETRON 4 MG PO TBDP: ORAL_TABLET | ORAL | 0 refills | Status: DC

## 2019-07-03 MED ORDER — SODIUM CHLORIDE 0.9% FLUSH: 10.0000 mL | INTRAVENOUS | Status: DC | PRN

## 2019-07-03 MED ORDER — BENZONATATE 100 MG PO CAPS: 100.0000 mg | ORAL_CAPSULE | Freq: Three times a day (TID) | ORAL | 0 refills | Status: DC | PRN

## 2019-07-03 NOTE — ED Notes (Signed)
Patient verbalizes understanding of discharge instructions. Opportunity for questioning and answers were provided. Armband removed by staff, pt discharged from ED.  

## 2019-07-03 NOTE — ED Notes (Addendum)
Attempted to stick pt with IV, but didn't get the vein. I tried finding another vein, but I couldn't find any. Notified IV team

## 2019-07-03 NOTE — ED Triage Notes (Signed)
Pt sent by PCP, because they believe pt has pneumonia. Pt states has had temp, productive cough w/white phlegmx 5wks, nausea, pt states she can't eatx3 wks. Pt is COVID +

## 2019-07-03 NOTE — ED Notes (Signed)
Pt took bp cuff off and said it is hurting her arm.

## 2019-07-03 NOTE — ED Provider Notes (Signed)
[facility] EMERGENCY DEPARTMENT Provider Note   CSN: [id number] Arrival date & time: 07/03/19  1207    History   Chief Complaint Chief Complaint  Patient presents with  . Shortness of Breath    HPI Melanie Miles is a 73 y.o. female.     HPI Patient with history of asthma presents with worsening cough, fever and nausea after being diagnosed with COVID-19 6 days ago.  States her husband also has been diagnosed with the same.  She denies any vomiting.  States cough keeps her up at night.  Is nonproductive. Past Medical History:  Diagnosis Date  . Asthma   . Carpal tunnel syndrome   . Diabetes ([place])   . TMJ disease     Patient Active Problem List   Diagnosis Date Noted  . Moderate persistent asthma 08/25/2015  . Laryngopharyngeal reflux 08/25/2015    Past Surgical History:  Procedure Laterality Date  . APPENDECTOMY    . CHOLECYSTECTOMY    . PARTIAL HYSTERECTOMY    . SHOULDER SURGERY    . TUMOR REMOVAL       OB History   No obstetric history on file.      Home Medications    Prior to Admission medications   Medication Sig Start Date End Date Taking? Authorizing Provider  albuterol (VENTOLIN HFA) 108 (90 BASE) MCG/ACT inhaler Inhale into the lungs every 6 (six) hours as needed for wheezing or shortness of breath. Inhale two puffs every four to six hours as needed.    [provider]  ALBUTEROL IN Inhale 1 ampule into the lungs. Inhale contents of one ampule with nebulizer every four to six hours as needed for cough or wheeze.    [provider]  benzonatate (TESSALON) 100 MG capsule Take 1 capsule (100 mg total) by mouth 3 (three) times daily as needed for cough. 07/03/19   Julianne Rice, MD  Cyanocobalamin (B-12 IJ) Inject 1 Dose as directed. Take one injection once a month.    [provider]  fluticasone (FLONASE) 50 MCG/ACT nasal spray Place 2 sprays into both nostrils daily. 09/20/15   Mannam, Hart Robinsons, MD   formoterol (PERFOROMIST) 20 MCG/2ML nebulizer solution Take 2 mLs (20 mcg total) by nebulization 2 (two) times daily. Patient not taking: Reported on 11/01/2015 10/06/15   Marshell Garfinkel, MD  hydroxychloroquine (PLAQUENIL) 200 MG tablet Take 200 mg by mouth daily.    [provider]  mometasone (ASMANEX 120 METERED DOSES) 220 MCG/INH inhaler Inhale 2 puffs into the lungs daily. 09/20/15   Mannam, Hart Robinsons, MD  montelukast (SINGULAIR) 10 MG tablet Take 1 tablet (10 mg total) by mouth daily. Patient not taking: Reported on 11/01/2015 08/25/15   Jiles Prows, MD  omeprazole (PRILOSEC) 40 MG capsule Take 1 capsule (40 mg total) by mouth 2 (two) times daily before a meal. 09/20/15   Mannam, Praveen, MD  ondansetron (ZOFRAN ODT) 4 MG disintegrating tablet 4mg  ODT q4 hours prn nausea/vomit 07/03/19   Julianne Rice, MD  pioglitazone (ACTOS) 30 MG tablet Take 30 mg by mouth daily.    [provider]    Family History Family History  Problem Relation Age of Onset  . Dementia Mother   . Diabetes Mother   . Breast cancer Sister   . Rheum arthritis Sister   . Diabetes Brother   . Arthritis Brother   . Cancer Father   . Arthritis Father   . Cancer Maternal Grandmother   . Cancer Maternal  Grandfather   . Asthma Paternal Grandmother   . Arthritis Brother     Social History Social History   Tobacco Use  . Smoking status: Never Smoker  . Smokeless tobacco: Never Used  Substance Use Topics  . Alcohol use: No  . Drug use: Never     Allergies   Penicillins, Pyridium [phenazopyridine hcl], Ciprofloxacin, Stadol [butorphanol], and Levofloxacin   Review of Systems Review of Systems  Constitutional: Positive for chills, fatigue and fever.  HENT: Negative for sore throat and trouble swallowing.   Respiratory: Positive for cough. Negative for shortness of breath and wheezing.   Cardiovascular: Negative for chest pain.  Gastrointestinal: Positive for nausea. Negative for  abdominal pain, diarrhea and vomiting.  Genitourinary: Negative for dysuria and flank pain.  Musculoskeletal: Negative for back pain, myalgias and neck pain.  Skin: Negative for rash and wound.  Neurological: Negative for dizziness, weakness, light-headedness, numbness and headaches.  All other systems reviewed and are negative.    Physical Exam Updated Vital Signs BP 133/83   Pulse 91   Temp 98.2 F (36.8 C) (Oral)   Resp 17   Ht 5\' 3"  (1.6 m)   Wt 68 kg   SpO2 95%   BMI 26.57 kg/m   Physical Exam Vitals signs and nursing note reviewed.  Constitutional:      General: She is not in acute distress.    Appearance: Normal appearance. She is well-developed. She is not ill-appearing.  HENT:     Head: Normocephalic and atraumatic.     Nose: Nose normal.  Eyes:     Pupils: Pupils are equal, round, and reactive to light.  Neck:     Musculoskeletal: Normal range of motion and neck supple.  Cardiovascular:     Rate and Rhythm: Normal rate and regular rhythm.     Heart sounds: No murmur. No friction rub. No gallop.   Pulmonary:     Effort: Pulmonary effort is normal. No respiratory distress.     Breath sounds: Normal breath sounds. No stridor. No wheezing, rhonchi or rales.  Chest:     Chest wall: No tenderness.  Abdominal:     General: Bowel sounds are normal.     Palpations: Abdomen is soft.     Tenderness: There is no abdominal tenderness. There is no guarding or rebound.  Musculoskeletal: Normal range of motion.        General: No swelling, tenderness, deformity or signs of injury.     Right lower leg: No edema.     Left lower leg: No edema.  Skin:    General: Skin is warm and dry.     Capillary Refill: Capillary refill takes less than 2 seconds.     Findings: No erythema or rash.  Neurological:     General: No focal deficit present.     Mental Status: She is alert and oriented to person, place, and time.  Psychiatric:        Behavior: Behavior normal.      ED  Treatments / Results  Labs (all labs ordered are listed, but only abnormal results are displayed) Labs Reviewed  BASIC METABOLIC PANEL - Abnormal; Notable for the following components:      Result Value   Glucose, Bld 124 (*)    Creatinine, Ser 1.23 (*)    GFR calc non Af Amer 44 (*)    GFR calc Af Amer 51 (*)    All other components within normal limits  CBC - Abnormal;  Notable for the following components:   RBC 3.57 (*)    Hemoglobin 10.9 (*)    HCT 34.1 (*)    All other components within normal limits    EKG EKG Interpretation  Date/Time:  Friday July 03 2019 12:31:57 EDT Ventricular Rate:  102 PR Interval:    QRS Duration: 78 QT Interval:  319 QTC Calculation: 416 R Axis:   15 Text Interpretation:  Sinus tachycardia Abnormal R-wave progression, early transition Confirmed by Julianne Rice [phone]) on 07/03/2019 3:13:41 PM   Radiology Dg Chest Portable 1 View  Result Date: 07/03/2019 CLINICAL DATA:  Cough for a month. EXAM: PORTABLE CHEST 1 VIEW COMPARISON:  August 25, 2015 FINDINGS: Bilateral pulmonary infiltrates are identified, left greater than right. The heart, hila, mediastinum, lungs, and pleura are otherwise unremarkable. No other acute abnormalities. IMPRESSION: Bilateral pulmonary infiltrates worrisome for pneumonia. Atypical infections are possible. No other acute abnormalities. Recommend follow-up to resolution. Electronically Signed   By: Dorise Bullion III M.D   On: 07/03/2019 13:58    Procedures Procedures (including critical care time)  Medications Ordered in ED Medications  sodium chloride flush (NS) 0.9 % injection 10-40 mL (has no administration in time range)  sodium chloride flush (NS) 0.9 % injection 10-40 mL (has no administration in time range)     Initial Impression / Assessment and Plan / ED Course  I have reviewed the triage vital signs and the nursing notes.  Pertinent labs & imaging results that were available during my care of  the patient were reviewed by me and considered in my medical decision making (see chart for details).        Patient is well-appearing.  Vital signs are stable.  Maintaining saturations in the high 90s on room air.  Suspect her symptoms are due to her coronavirus infection.  Normal white blood cell count.  Bilateral infiltrates.  Will treat symptomatically.  Return precautions given. Final Clinical Impressions(s) / ED Diagnoses   Final diagnoses:  Pneumonia due to COVID-19 virus    ED Discharge Orders         Ordered    benzonatate (TESSALON) 100 MG capsule  3 times daily PRN     07/03/19 1451    ondansetron (ZOFRAN ODT) 4 MG disintegrating tablet     07/03/19 1451           Julianne Rice, MD 07/03/19 1513

## 2019-07-06 DIAGNOSIS — J1289 Other viral pneumonia: Secondary | ICD-10-CM | POA: Diagnosis not present

## 2019-07-06 DIAGNOSIS — U071 COVID-19: Secondary | ICD-10-CM | POA: Diagnosis not present

## 2019-07-06 DIAGNOSIS — J4530 Mild persistent asthma, uncomplicated: Secondary | ICD-10-CM | POA: Diagnosis not present

## 2019-07-10 DIAGNOSIS — U071 COVID-19: Secondary | ICD-10-CM | POA: Diagnosis not present

## 2019-07-10 DIAGNOSIS — Z6826 Body mass index (BMI) 26.0-26.9, adult: Secondary | ICD-10-CM | POA: Diagnosis not present

## 2019-07-10 DIAGNOSIS — J1289 Other viral pneumonia: Secondary | ICD-10-CM | POA: Diagnosis not present

## 2019-07-10 DIAGNOSIS — J189 Pneumonia, unspecified organism: Secondary | ICD-10-CM | POA: Diagnosis not present

## 2019-07-23 DIAGNOSIS — J189 Pneumonia, unspecified organism: Secondary | ICD-10-CM | POA: Diagnosis not present

## 2019-07-23 DIAGNOSIS — J1289 Other viral pneumonia: Secondary | ICD-10-CM | POA: Diagnosis not present

## 2019-07-23 DIAGNOSIS — U071 COVID-19: Secondary | ICD-10-CM | POA: Diagnosis not present

## 2019-07-23 DIAGNOSIS — Z6826 Body mass index (BMI) 26.0-26.9, adult: Secondary | ICD-10-CM | POA: Diagnosis not present

## 2019-08-06 DIAGNOSIS — J1289 Other viral pneumonia: Secondary | ICD-10-CM | POA: Diagnosis not present

## 2019-08-06 DIAGNOSIS — U071 COVID-19: Secondary | ICD-10-CM | POA: Diagnosis not present

## 2019-08-06 DIAGNOSIS — Z6825 Body mass index (BMI) 25.0-25.9, adult: Secondary | ICD-10-CM | POA: Diagnosis not present

## 2019-08-20 DIAGNOSIS — D649 Anemia, unspecified: Secondary | ICD-10-CM

## 2019-08-21 DIAGNOSIS — Z0000 Encounter for general adult medical examination without abnormal findings: Secondary | ICD-10-CM | POA: Diagnosis not present

## 2019-08-21 DIAGNOSIS — Z1331 Encounter for screening for depression: Secondary | ICD-10-CM | POA: Diagnosis not present

## 2019-08-21 DIAGNOSIS — I10 Essential (primary) hypertension: Secondary | ICD-10-CM | POA: Diagnosis not present

## 2019-08-21 DIAGNOSIS — R809 Proteinuria, unspecified: Secondary | ICD-10-CM | POA: Diagnosis not present

## 2019-08-21 DIAGNOSIS — J1289 Other viral pneumonia: Secondary | ICD-10-CM | POA: Diagnosis not present

## 2019-08-21 DIAGNOSIS — U071 COVID-19: Secondary | ICD-10-CM | POA: Diagnosis not present

## 2019-08-21 DIAGNOSIS — E785 Hyperlipidemia, unspecified: Secondary | ICD-10-CM | POA: Diagnosis not present

## 2019-08-21 DIAGNOSIS — E1129 Type 2 diabetes mellitus with other diabetic kidney complication: Secondary | ICD-10-CM | POA: Diagnosis not present

## 2019-08-21 DIAGNOSIS — Z9181 History of falling: Secondary | ICD-10-CM | POA: Diagnosis not present

## 2019-08-21 DIAGNOSIS — Z13820 Encounter for screening for osteoporosis: Secondary | ICD-10-CM | POA: Diagnosis not present

## 2019-08-27 DIAGNOSIS — D649 Anemia, unspecified: Secondary | ICD-10-CM | POA: Diagnosis not present

## 2019-08-27 DIAGNOSIS — D72819 Decreased white blood cell count, unspecified: Secondary | ICD-10-CM | POA: Diagnosis not present

## 2019-08-27 DIAGNOSIS — E538 Deficiency of other specified B group vitamins: Secondary | ICD-10-CM | POA: Diagnosis not present

## 2019-08-28 DIAGNOSIS — E538 Deficiency of other specified B group vitamins: Secondary | ICD-10-CM | POA: Diagnosis not present

## 2019-08-31 DIAGNOSIS — E538 Deficiency of other specified B group vitamins: Secondary | ICD-10-CM | POA: Diagnosis not present

## 2019-09-01 DIAGNOSIS — E538 Deficiency of other specified B group vitamins: Secondary | ICD-10-CM | POA: Diagnosis not present

## 2019-09-02 DIAGNOSIS — E538 Deficiency of other specified B group vitamins: Secondary | ICD-10-CM | POA: Diagnosis not present

## 2019-09-03 DIAGNOSIS — E538 Deficiency of other specified B group vitamins: Secondary | ICD-10-CM | POA: Diagnosis not present

## 2019-09-04 DIAGNOSIS — E538 Deficiency of other specified B group vitamins: Secondary | ICD-10-CM | POA: Diagnosis not present

## 2019-09-11 DIAGNOSIS — E538 Deficiency of other specified B group vitamins: Secondary | ICD-10-CM | POA: Diagnosis not present

## 2019-09-18 DIAGNOSIS — D519 Vitamin B12 deficiency anemia, unspecified: Secondary | ICD-10-CM | POA: Diagnosis not present

## 2019-09-21 DIAGNOSIS — J1289 Other viral pneumonia: Secondary | ICD-10-CM | POA: Diagnosis not present

## 2019-09-21 DIAGNOSIS — U071 COVID-19: Secondary | ICD-10-CM | POA: Diagnosis not present

## 2019-09-21 DIAGNOSIS — Z6826 Body mass index (BMI) 26.0-26.9, adult: Secondary | ICD-10-CM | POA: Diagnosis not present

## 2019-09-21 DIAGNOSIS — I10 Essential (primary) hypertension: Secondary | ICD-10-CM | POA: Diagnosis not present

## 2019-09-21 DIAGNOSIS — R0982 Postnasal drip: Secondary | ICD-10-CM | POA: Diagnosis not present

## 2019-09-21 DIAGNOSIS — J4530 Mild persistent asthma, uncomplicated: Secondary | ICD-10-CM | POA: Diagnosis not present

## 2019-09-25 DIAGNOSIS — D519 Vitamin B12 deficiency anemia, unspecified: Secondary | ICD-10-CM | POA: Diagnosis not present

## 2019-10-17 DIAGNOSIS — Z1231 Encounter for screening mammogram for malignant neoplasm of breast: Secondary | ICD-10-CM | POA: Diagnosis not present

## 2019-10-26 DIAGNOSIS — Z6826 Body mass index (BMI) 26.0-26.9, adult: Secondary | ICD-10-CM | POA: Diagnosis not present

## 2019-10-26 DIAGNOSIS — E538 Deficiency of other specified B group vitamins: Secondary | ICD-10-CM | POA: Diagnosis not present

## 2019-10-26 DIAGNOSIS — E1129 Type 2 diabetes mellitus with other diabetic kidney complication: Secondary | ICD-10-CM | POA: Diagnosis not present

## 2019-10-26 DIAGNOSIS — J4530 Mild persistent asthma, uncomplicated: Secondary | ICD-10-CM | POA: Diagnosis not present

## 2019-10-26 DIAGNOSIS — M810 Age-related osteoporosis without current pathological fracture: Secondary | ICD-10-CM | POA: Diagnosis not present

## 2019-10-26 DIAGNOSIS — R809 Proteinuria, unspecified: Secondary | ICD-10-CM | POA: Diagnosis not present

## 2019-10-26 DIAGNOSIS — J1289 Other viral pneumonia: Secondary | ICD-10-CM | POA: Diagnosis not present

## 2019-10-29 DIAGNOSIS — Z79899 Other long term (current) drug therapy: Secondary | ICD-10-CM | POA: Diagnosis not present

## 2019-10-29 DIAGNOSIS — H10023 Other mucopurulent conjunctivitis, bilateral: Secondary | ICD-10-CM | POA: Diagnosis not present

## 2019-10-29 DIAGNOSIS — E119 Type 2 diabetes mellitus without complications: Secondary | ICD-10-CM | POA: Diagnosis not present

## 2019-10-29 DIAGNOSIS — M0609 Rheumatoid arthritis without rheumatoid factor, multiple sites: Secondary | ICD-10-CM | POA: Diagnosis not present

## 2019-10-30 DIAGNOSIS — Z0100 Encounter for examination of eyes and vision without abnormal findings: Secondary | ICD-10-CM | POA: Diagnosis not present

## 2019-11-25 DIAGNOSIS — E538 Deficiency of other specified B group vitamins: Secondary | ICD-10-CM | POA: Diagnosis not present

## 2019-12-01 DIAGNOSIS — H0288A Meibomian gland dysfunction right eye, upper and lower eyelids: Secondary | ICD-10-CM | POA: Diagnosis not present

## 2019-12-16 DIAGNOSIS — H02886 Meibomian gland dysfunction of left eye, unspecified eyelid: Secondary | ICD-10-CM | POA: Diagnosis not present

## 2019-12-16 DIAGNOSIS — H02883 Meibomian gland dysfunction of right eye, unspecified eyelid: Secondary | ICD-10-CM | POA: Diagnosis not present

## 2019-12-25 DIAGNOSIS — E538 Deficiency of other specified B group vitamins: Secondary | ICD-10-CM | POA: Diagnosis not present

## 2020-01-01 DIAGNOSIS — D631 Anemia in chronic kidney disease: Secondary | ICD-10-CM

## 2020-01-01 DIAGNOSIS — N189 Chronic kidney disease, unspecified: Secondary | ICD-10-CM

## 2020-01-01 DIAGNOSIS — E538 Deficiency of other specified B group vitamins: Secondary | ICD-10-CM

## 2020-01-01 DIAGNOSIS — D72819 Decreased white blood cell count, unspecified: Secondary | ICD-10-CM | POA: Diagnosis not present

## 2020-01-12 DIAGNOSIS — H02883 Meibomian gland dysfunction of right eye, unspecified eyelid: Secondary | ICD-10-CM | POA: Diagnosis not present

## 2020-01-22 DIAGNOSIS — E538 Deficiency of other specified B group vitamins: Secondary | ICD-10-CM | POA: Diagnosis not present

## 2020-02-23 DIAGNOSIS — N3021 Other chronic cystitis with hematuria: Secondary | ICD-10-CM | POA: Diagnosis not present

## 2020-02-23 DIAGNOSIS — D519 Vitamin B12 deficiency anemia, unspecified: Secondary | ICD-10-CM | POA: Diagnosis not present

## 2020-02-23 DIAGNOSIS — E1129 Type 2 diabetes mellitus with other diabetic kidney complication: Secondary | ICD-10-CM | POA: Diagnosis not present

## 2020-02-23 DIAGNOSIS — Z1331 Encounter for screening for depression: Secondary | ICD-10-CM | POA: Diagnosis not present

## 2020-02-23 DIAGNOSIS — Z2821 Immunization not carried out because of patient refusal: Secondary | ICD-10-CM | POA: Diagnosis not present

## 2020-02-23 DIAGNOSIS — Z139 Encounter for screening, unspecified: Secondary | ICD-10-CM | POA: Diagnosis not present

## 2020-02-23 DIAGNOSIS — E785 Hyperlipidemia, unspecified: Secondary | ICD-10-CM | POA: Diagnosis not present

## 2020-02-23 DIAGNOSIS — I10 Essential (primary) hypertension: Secondary | ICD-10-CM | POA: Diagnosis not present

## 2020-02-23 DIAGNOSIS — R809 Proteinuria, unspecified: Secondary | ICD-10-CM | POA: Diagnosis not present

## 2020-02-23 DIAGNOSIS — Z79899 Other long term (current) drug therapy: Secondary | ICD-10-CM | POA: Diagnosis not present

## 2020-03-02 DIAGNOSIS — M79672 Pain in left foot: Secondary | ICD-10-CM | POA: Diagnosis not present

## 2020-03-02 DIAGNOSIS — M722 Plantar fascial fibromatosis: Secondary | ICD-10-CM | POA: Diagnosis not present

## 2020-03-02 DIAGNOSIS — M47816 Spondylosis without myelopathy or radiculopathy, lumbar region: Secondary | ICD-10-CM | POA: Diagnosis not present

## 2020-03-02 DIAGNOSIS — M8588 Other specified disorders of bone density and structure, other site: Secondary | ICD-10-CM | POA: Diagnosis not present

## 2020-03-02 DIAGNOSIS — M359 Systemic involvement of connective tissue, unspecified: Secondary | ICD-10-CM | POA: Diagnosis not present

## 2020-03-02 DIAGNOSIS — E559 Vitamin D deficiency, unspecified: Secondary | ICD-10-CM | POA: Diagnosis not present

## 2020-03-02 DIAGNOSIS — M19041 Primary osteoarthritis, right hand: Secondary | ICD-10-CM | POA: Diagnosis not present

## 2020-03-02 DIAGNOSIS — E119 Type 2 diabetes mellitus without complications: Secondary | ICD-10-CM | POA: Diagnosis not present

## 2020-03-02 DIAGNOSIS — G894 Chronic pain syndrome: Secondary | ICD-10-CM | POA: Diagnosis not present

## 2020-03-02 DIAGNOSIS — M1990 Unspecified osteoarthritis, unspecified site: Secondary | ICD-10-CM | POA: Diagnosis not present

## 2020-03-11 DIAGNOSIS — H02883 Meibomian gland dysfunction of right eye, unspecified eyelid: Secondary | ICD-10-CM | POA: Diagnosis not present

## 2020-03-11 DIAGNOSIS — M3501 Sicca syndrome with keratoconjunctivitis: Secondary | ICD-10-CM | POA: Diagnosis not present

## 2020-03-18 DIAGNOSIS — E538 Deficiency of other specified B group vitamins: Secondary | ICD-10-CM | POA: Diagnosis not present

## 2020-03-29 DIAGNOSIS — M7989 Other specified soft tissue disorders: Secondary | ICD-10-CM | POA: Diagnosis not present

## 2020-03-29 DIAGNOSIS — S8992XA Unspecified injury of left lower leg, initial encounter: Secondary | ICD-10-CM | POA: Diagnosis not present

## 2020-03-29 DIAGNOSIS — M79662 Pain in left lower leg: Secondary | ICD-10-CM | POA: Diagnosis not present

## 2020-03-29 DIAGNOSIS — Z6826 Body mass index (BMI) 26.0-26.9, adult: Secondary | ICD-10-CM | POA: Diagnosis not present

## 2020-03-30 DIAGNOSIS — M25562 Pain in left knee: Secondary | ICD-10-CM | POA: Diagnosis not present

## 2020-03-30 DIAGNOSIS — M1712 Unilateral primary osteoarthritis, left knee: Secondary | ICD-10-CM | POA: Diagnosis not present

## 2020-04-07 DIAGNOSIS — Z6826 Body mass index (BMI) 26.0-26.9, adult: Secondary | ICD-10-CM | POA: Diagnosis not present

## 2020-04-07 DIAGNOSIS — J329 Chronic sinusitis, unspecified: Secondary | ICD-10-CM | POA: Diagnosis not present

## 2020-04-07 DIAGNOSIS — J309 Allergic rhinitis, unspecified: Secondary | ICD-10-CM | POA: Diagnosis not present

## 2020-04-07 DIAGNOSIS — R0982 Postnasal drip: Secondary | ICD-10-CM | POA: Diagnosis not present

## 2020-04-07 DIAGNOSIS — J40 Bronchitis, not specified as acute or chronic: Secondary | ICD-10-CM | POA: Diagnosis not present

## 2020-04-16 DIAGNOSIS — H209 Unspecified iridocyclitis: Secondary | ICD-10-CM | POA: Diagnosis not present

## 2020-04-16 DIAGNOSIS — J45909 Unspecified asthma, uncomplicated: Secondary | ICD-10-CM | POA: Diagnosis not present

## 2020-04-16 DIAGNOSIS — R0981 Nasal congestion: Secondary | ICD-10-CM | POA: Diagnosis not present

## 2020-05-17 DIAGNOSIS — M3501 Sicca syndrome with keratoconjunctivitis: Secondary | ICD-10-CM | POA: Diagnosis not present

## 2020-05-23 DIAGNOSIS — E538 Deficiency of other specified B group vitamins: Secondary | ICD-10-CM | POA: Diagnosis not present

## 2020-06-14 DIAGNOSIS — D649 Anemia, unspecified: Secondary | ICD-10-CM | POA: Diagnosis not present

## 2020-06-14 DIAGNOSIS — D72819 Decreased white blood cell count, unspecified: Secondary | ICD-10-CM | POA: Diagnosis not present

## 2020-07-18 DIAGNOSIS — E538 Deficiency of other specified B group vitamins: Secondary | ICD-10-CM | POA: Diagnosis not present

## 2020-07-28 DIAGNOSIS — M3501 Sicca syndrome with keratoconjunctivitis: Secondary | ICD-10-CM | POA: Diagnosis not present

## 2020-08-16 IMAGING — DX PORTABLE CHEST - 1 VIEW
1 series · 1 of 1 positions shown · non-contrast
Comparison: August 25, 2015

CLINICAL DATA: Cough for a month.

EXAM:
PORTABLE CHEST 1 VIEW

[chest ap]
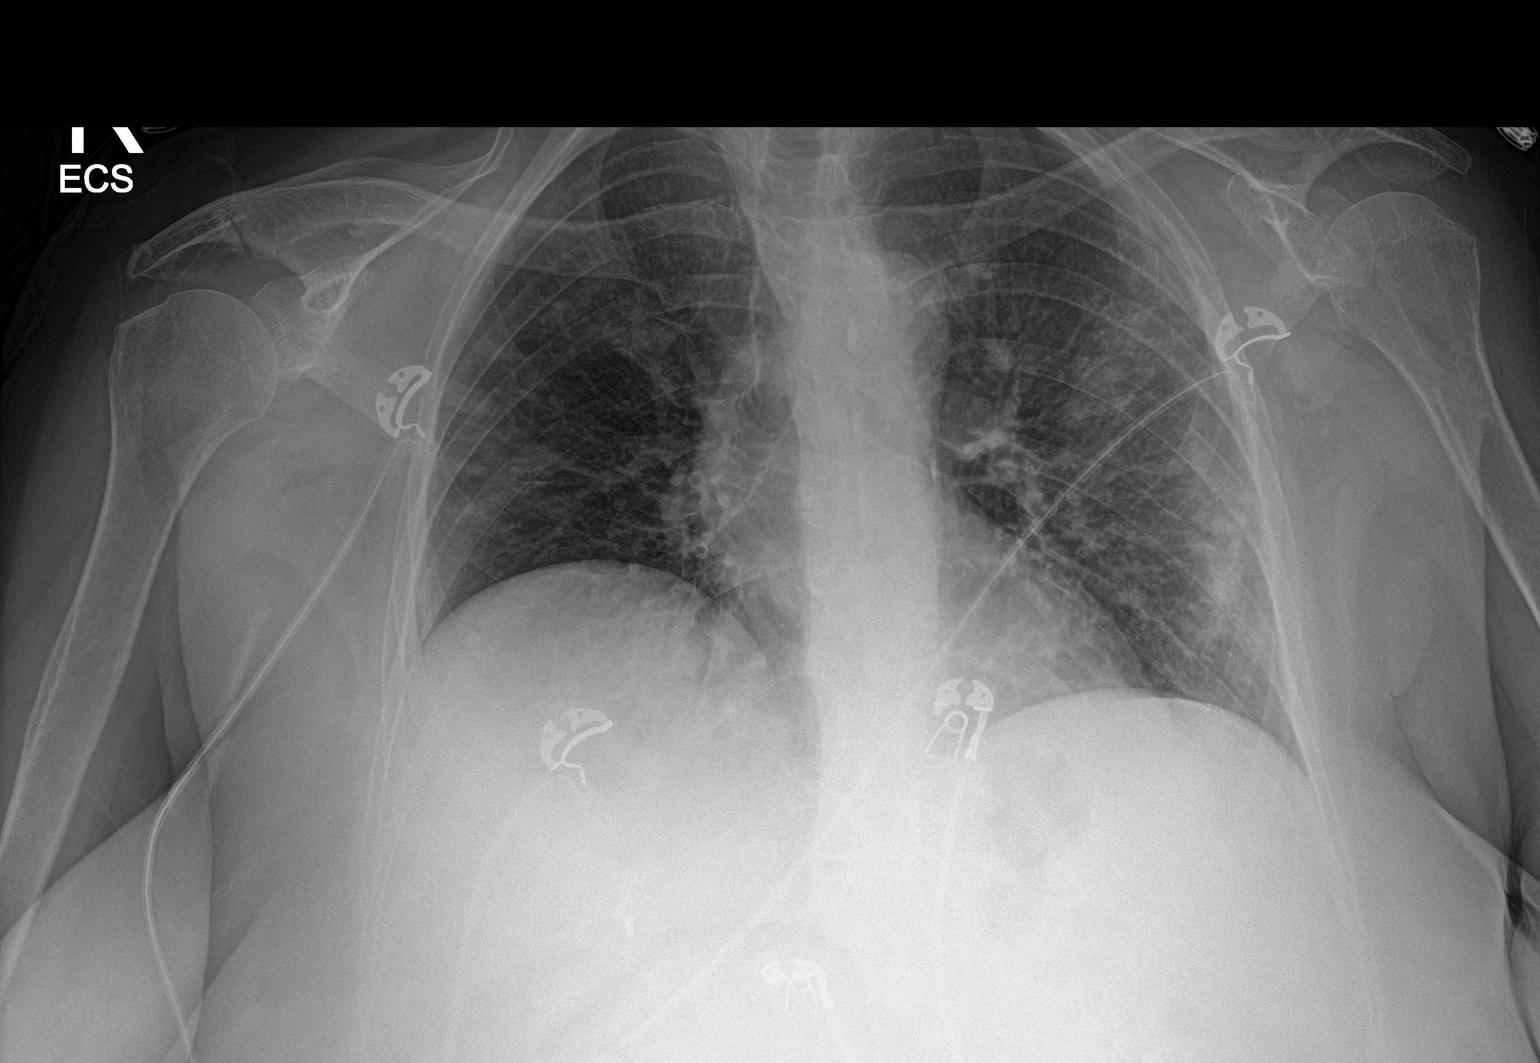

[1 of 1 positions shown; findings below may reference images not displayed]

FINDINGS: Bilateral pulmonary infiltrates are identified, left greater than
right. The heart, hila, mediastinum, lungs, and pleura are otherwise
unremarkable. No other acute abnormalities.
IMPRESSION: Bilateral pulmonary infiltrates worrisome for pneumonia. Atypical
infections are possible. No other acute abnormalities. Recommend
follow-up to resolution.

## 2020-08-22 DIAGNOSIS — E538 Deficiency of other specified B group vitamins: Secondary | ICD-10-CM | POA: Diagnosis not present

## 2020-09-08 DIAGNOSIS — E1151 Type 2 diabetes mellitus with diabetic peripheral angiopathy without gangrene: Secondary | ICD-10-CM | POA: Diagnosis not present

## 2020-09-08 DIAGNOSIS — K5900 Constipation, unspecified: Secondary | ICD-10-CM | POA: Diagnosis not present

## 2020-09-08 DIAGNOSIS — J45909 Unspecified asthma, uncomplicated: Secondary | ICD-10-CM | POA: Diagnosis not present

## 2020-09-08 DIAGNOSIS — G8929 Other chronic pain: Secondary | ICD-10-CM | POA: Diagnosis not present

## 2020-09-08 DIAGNOSIS — M1990 Unspecified osteoarthritis, unspecified site: Secondary | ICD-10-CM | POA: Diagnosis not present

## 2020-09-08 DIAGNOSIS — M069 Rheumatoid arthritis, unspecified: Secondary | ICD-10-CM | POA: Diagnosis not present

## 2020-09-08 DIAGNOSIS — N1831 Chronic kidney disease, stage 3a: Secondary | ICD-10-CM | POA: Diagnosis not present

## 2020-09-08 DIAGNOSIS — E1122 Type 2 diabetes mellitus with diabetic chronic kidney disease: Secondary | ICD-10-CM | POA: Diagnosis not present

## 2020-09-08 DIAGNOSIS — K219 Gastro-esophageal reflux disease without esophagitis: Secondary | ICD-10-CM | POA: Diagnosis not present

## 2020-09-08 DIAGNOSIS — I499 Cardiac arrhythmia, unspecified: Secondary | ICD-10-CM | POA: Diagnosis not present

## 2020-09-12 DIAGNOSIS — Z6826 Body mass index (BMI) 26.0-26.9, adult: Secondary | ICD-10-CM | POA: Diagnosis not present

## 2020-09-12 DIAGNOSIS — E1129 Type 2 diabetes mellitus with other diabetic kidney complication: Secondary | ICD-10-CM | POA: Diagnosis not present

## 2020-09-12 DIAGNOSIS — R809 Proteinuria, unspecified: Secondary | ICD-10-CM | POA: Diagnosis not present

## 2020-09-12 DIAGNOSIS — R053 Chronic cough: Secondary | ICD-10-CM | POA: Diagnosis not present

## 2020-09-12 DIAGNOSIS — E538 Deficiency of other specified B group vitamins: Secondary | ICD-10-CM | POA: Diagnosis not present

## 2020-09-21 DIAGNOSIS — H0288A Meibomian gland dysfunction right eye, upper and lower eyelids: Secondary | ICD-10-CM | POA: Diagnosis not present

## 2020-09-21 DIAGNOSIS — M3501 Sicca syndrome with keratoconjunctivitis: Secondary | ICD-10-CM | POA: Diagnosis not present

## 2020-10-17 DIAGNOSIS — Z6826 Body mass index (BMI) 26.0-26.9, adult: Secondary | ICD-10-CM | POA: Diagnosis not present

## 2020-10-17 DIAGNOSIS — M0579 Rheumatoid arthritis with rheumatoid factor of multiple sites without organ or systems involvement: Secondary | ICD-10-CM | POA: Diagnosis not present

## 2020-10-17 DIAGNOSIS — K219 Gastro-esophageal reflux disease without esophagitis: Secondary | ICD-10-CM | POA: Diagnosis not present

## 2020-10-17 DIAGNOSIS — E1129 Type 2 diabetes mellitus with other diabetic kidney complication: Secondary | ICD-10-CM | POA: Diagnosis not present

## 2020-10-17 DIAGNOSIS — R809 Proteinuria, unspecified: Secondary | ICD-10-CM | POA: Diagnosis not present

## 2020-10-17 DIAGNOSIS — Z1331 Encounter for screening for depression: Secondary | ICD-10-CM | POA: Diagnosis not present

## 2020-10-17 DIAGNOSIS — H81312 Aural vertigo, left ear: Secondary | ICD-10-CM | POA: Diagnosis not present

## 2020-10-17 DIAGNOSIS — I10 Essential (primary) hypertension: Secondary | ICD-10-CM | POA: Diagnosis not present

## 2020-10-17 DIAGNOSIS — Z79899 Other long term (current) drug therapy: Secondary | ICD-10-CM | POA: Diagnosis not present

## 2020-10-17 DIAGNOSIS — Z0000 Encounter for general adult medical examination without abnormal findings: Secondary | ICD-10-CM | POA: Diagnosis not present

## 2020-11-02 DIAGNOSIS — M722 Plantar fascial fibromatosis: Secondary | ICD-10-CM | POA: Diagnosis not present

## 2020-11-02 DIAGNOSIS — Z8719 Personal history of other diseases of the digestive system: Secondary | ICD-10-CM | POA: Diagnosis not present

## 2020-11-02 DIAGNOSIS — G894 Chronic pain syndrome: Secondary | ICD-10-CM | POA: Diagnosis not present

## 2020-11-02 DIAGNOSIS — M8588 Other specified disorders of bone density and structure, other site: Secondary | ICD-10-CM | POA: Diagnosis not present

## 2020-11-02 DIAGNOSIS — E119 Type 2 diabetes mellitus without complications: Secondary | ICD-10-CM | POA: Diagnosis not present

## 2020-11-02 DIAGNOSIS — M359 Systemic involvement of connective tissue, unspecified: Secondary | ICD-10-CM | POA: Diagnosis not present

## 2020-11-02 DIAGNOSIS — M47816 Spondylosis without myelopathy or radiculopathy, lumbar region: Secondary | ICD-10-CM | POA: Diagnosis not present

## 2020-11-02 DIAGNOSIS — H04123 Dry eye syndrome of bilateral lacrimal glands: Secondary | ICD-10-CM | POA: Diagnosis not present

## 2020-11-02 DIAGNOSIS — M19041 Primary osteoarthritis, right hand: Secondary | ICD-10-CM | POA: Diagnosis not present

## 2020-11-02 DIAGNOSIS — Z79899 Other long term (current) drug therapy: Secondary | ICD-10-CM | POA: Diagnosis not present

## 2020-11-14 DIAGNOSIS — Z6826 Body mass index (BMI) 26.0-26.9, adult: Secondary | ICD-10-CM | POA: Diagnosis not present

## 2020-11-14 DIAGNOSIS — E1129 Type 2 diabetes mellitus with other diabetic kidney complication: Secondary | ICD-10-CM | POA: Diagnosis not present

## 2020-11-14 DIAGNOSIS — I10 Essential (primary) hypertension: Secondary | ICD-10-CM | POA: Diagnosis not present

## 2020-11-14 DIAGNOSIS — Z1331 Encounter for screening for depression: Secondary | ICD-10-CM | POA: Diagnosis not present

## 2020-11-14 DIAGNOSIS — R809 Proteinuria, unspecified: Secondary | ICD-10-CM | POA: Diagnosis not present

## 2020-11-14 DIAGNOSIS — N3001 Acute cystitis with hematuria: Secondary | ICD-10-CM | POA: Diagnosis not present

## 2020-11-24 DIAGNOSIS — R269 Unspecified abnormalities of gait and mobility: Secondary | ICD-10-CM | POA: Diagnosis not present

## 2020-11-24 DIAGNOSIS — H6122 Impacted cerumen, left ear: Secondary | ICD-10-CM | POA: Diagnosis not present

## 2020-11-24 DIAGNOSIS — H9313 Tinnitus, bilateral: Secondary | ICD-10-CM | POA: Diagnosis not present

## 2020-11-24 DIAGNOSIS — M26629 Arthralgia of temporomandibular joint, unspecified side: Secondary | ICD-10-CM | POA: Diagnosis not present

## 2020-11-24 DIAGNOSIS — H61303 Acquired stenosis of external ear canal, unspecified, bilateral: Secondary | ICD-10-CM | POA: Diagnosis not present

## 2020-11-24 DIAGNOSIS — H903 Sensorineural hearing loss, bilateral: Secondary | ICD-10-CM | POA: Diagnosis not present

## 2020-11-30 DIAGNOSIS — H01009 Unspecified blepharitis unspecified eye, unspecified eyelid: Secondary | ICD-10-CM | POA: Diagnosis not present

## 2020-12-09 DIAGNOSIS — B029 Zoster without complications: Secondary | ICD-10-CM | POA: Diagnosis not present

## 2020-12-09 DIAGNOSIS — R110 Nausea: Secondary | ICD-10-CM | POA: Diagnosis not present

## 2020-12-15 ENCOUNTER — Other Ambulatory Visit: Payer: Medicare HMO

## 2020-12-15 ENCOUNTER — Ambulatory Visit: Payer: Medicare HMO | Admitting: Oncology

## 2020-12-25 DIAGNOSIS — M0579 Rheumatoid arthritis with rheumatoid factor of multiple sites without organ or systems involvement: Secondary | ICD-10-CM | POA: Diagnosis not present

## 2020-12-25 DIAGNOSIS — E1129 Type 2 diabetes mellitus with other diabetic kidney complication: Secondary | ICD-10-CM | POA: Diagnosis not present

## 2020-12-25 DIAGNOSIS — K219 Gastro-esophageal reflux disease without esophagitis: Secondary | ICD-10-CM | POA: Diagnosis not present

## 2020-12-26 DIAGNOSIS — B029 Zoster without complications: Secondary | ICD-10-CM | POA: Diagnosis not present

## 2020-12-26 DIAGNOSIS — B354 Tinea corporis: Secondary | ICD-10-CM | POA: Diagnosis not present

## 2020-12-26 DIAGNOSIS — B0229 Other postherpetic nervous system involvement: Secondary | ICD-10-CM | POA: Diagnosis not present

## 2020-12-26 DIAGNOSIS — Z6826 Body mass index (BMI) 26.0-26.9, adult: Secondary | ICD-10-CM | POA: Diagnosis not present

## 2021-01-11 DIAGNOSIS — H02889 Meibomian gland dysfunction of unspecified eye, unspecified eyelid: Secondary | ICD-10-CM | POA: Diagnosis not present

## 2021-01-23 DIAGNOSIS — K219 Gastro-esophageal reflux disease without esophagitis: Secondary | ICD-10-CM | POA: Diagnosis not present

## 2021-01-23 DIAGNOSIS — E1129 Type 2 diabetes mellitus with other diabetic kidney complication: Secondary | ICD-10-CM | POA: Diagnosis not present

## 2021-01-23 DIAGNOSIS — M0579 Rheumatoid arthritis with rheumatoid factor of multiple sites without organ or systems involvement: Secondary | ICD-10-CM | POA: Diagnosis not present

## 2021-02-27 DIAGNOSIS — M0579 Rheumatoid arthritis with rheumatoid factor of multiple sites without organ or systems involvement: Secondary | ICD-10-CM | POA: Diagnosis not present

## 2021-02-27 DIAGNOSIS — E538 Deficiency of other specified B group vitamins: Secondary | ICD-10-CM | POA: Diagnosis not present

## 2021-02-27 DIAGNOSIS — N1831 Chronic kidney disease, stage 3a: Secondary | ICD-10-CM | POA: Diagnosis not present

## 2021-02-27 DIAGNOSIS — B029 Zoster without complications: Secondary | ICD-10-CM | POA: Diagnosis not present

## 2021-02-27 DIAGNOSIS — Z6825 Body mass index (BMI) 25.0-25.9, adult: Secondary | ICD-10-CM | POA: Diagnosis not present

## 2021-02-27 DIAGNOSIS — E1129 Type 2 diabetes mellitus with other diabetic kidney complication: Secondary | ICD-10-CM | POA: Diagnosis not present

## 2021-02-27 DIAGNOSIS — N905 Atrophy of vulva: Secondary | ICD-10-CM | POA: Diagnosis not present

## 2021-02-27 DIAGNOSIS — R809 Proteinuria, unspecified: Secondary | ICD-10-CM | POA: Diagnosis not present

## 2021-02-27 DIAGNOSIS — Z79899 Other long term (current) drug therapy: Secondary | ICD-10-CM | POA: Diagnosis not present

## 2021-02-27 DIAGNOSIS — N3001 Acute cystitis with hematuria: Secondary | ICD-10-CM | POA: Diagnosis not present

## 2021-03-02 DIAGNOSIS — E1129 Type 2 diabetes mellitus with other diabetic kidney complication: Secondary | ICD-10-CM | POA: Diagnosis not present

## 2021-03-02 DIAGNOSIS — N3001 Acute cystitis with hematuria: Secondary | ICD-10-CM | POA: Diagnosis not present

## 2021-03-06 DIAGNOSIS — R051 Acute cough: Secondary | ICD-10-CM | POA: Diagnosis not present

## 2021-03-06 DIAGNOSIS — J069 Acute upper respiratory infection, unspecified: Secondary | ICD-10-CM | POA: Diagnosis not present

## 2021-03-06 DIAGNOSIS — R0981 Nasal congestion: Secondary | ICD-10-CM | POA: Diagnosis not present

## 2021-06-20 DIAGNOSIS — U071 COVID-19: Secondary | ICD-10-CM | POA: Diagnosis not present

## 2021-07-04 DIAGNOSIS — Z6825 Body mass index (BMI) 25.0-25.9, adult: Secondary | ICD-10-CM | POA: Diagnosis not present

## 2021-07-04 DIAGNOSIS — E1129 Type 2 diabetes mellitus with other diabetic kidney complication: Secondary | ICD-10-CM | POA: Diagnosis not present

## 2021-07-04 DIAGNOSIS — R809 Proteinuria, unspecified: Secondary | ICD-10-CM | POA: Diagnosis not present

## 2021-07-04 DIAGNOSIS — U071 COVID-19: Secondary | ICD-10-CM | POA: Diagnosis not present

## 2021-08-04 DIAGNOSIS — N1831 Chronic kidney disease, stage 3a: Secondary | ICD-10-CM | POA: Diagnosis not present

## 2021-08-04 DIAGNOSIS — M0579 Rheumatoid arthritis with rheumatoid factor of multiple sites without organ or systems involvement: Secondary | ICD-10-CM | POA: Diagnosis not present

## 2021-08-04 DIAGNOSIS — K219 Gastro-esophageal reflux disease without esophagitis: Secondary | ICD-10-CM | POA: Diagnosis not present

## 2021-08-04 DIAGNOSIS — E1129 Type 2 diabetes mellitus with other diabetic kidney complication: Secondary | ICD-10-CM | POA: Diagnosis not present

## 2021-08-04 DIAGNOSIS — Z6825 Body mass index (BMI) 25.0-25.9, adult: Secondary | ICD-10-CM | POA: Diagnosis not present

## 2021-08-04 DIAGNOSIS — U071 COVID-19: Secondary | ICD-10-CM | POA: Diagnosis not present

## 2021-08-04 DIAGNOSIS — R809 Proteinuria, unspecified: Secondary | ICD-10-CM | POA: Diagnosis not present

## 2021-09-15 DIAGNOSIS — J45991 Cough variant asthma: Secondary | ICD-10-CM | POA: Diagnosis not present

## 2021-09-15 DIAGNOSIS — Z23 Encounter for immunization: Secondary | ICD-10-CM | POA: Diagnosis not present

## 2021-09-15 DIAGNOSIS — J40 Bronchitis, not specified as acute or chronic: Secondary | ICD-10-CM | POA: Diagnosis not present

## 2021-09-15 DIAGNOSIS — J329 Chronic sinusitis, unspecified: Secondary | ICD-10-CM | POA: Diagnosis not present

## 2021-09-15 DIAGNOSIS — Z6825 Body mass index (BMI) 25.0-25.9, adult: Secondary | ICD-10-CM | POA: Diagnosis not present

## 2021-09-15 DIAGNOSIS — K219 Gastro-esophageal reflux disease without esophagitis: Secondary | ICD-10-CM | POA: Diagnosis not present

## 2021-10-17 DIAGNOSIS — D709 Neutropenia, unspecified: Secondary | ICD-10-CM | POA: Diagnosis not present

## 2021-10-17 DIAGNOSIS — J45991 Cough variant asthma: Secondary | ICD-10-CM | POA: Diagnosis not present

## 2021-10-17 DIAGNOSIS — K219 Gastro-esophageal reflux disease without esophagitis: Secondary | ICD-10-CM | POA: Diagnosis not present

## 2021-10-17 DIAGNOSIS — E049 Nontoxic goiter, unspecified: Secondary | ICD-10-CM | POA: Diagnosis not present

## 2021-10-17 DIAGNOSIS — Z6825 Body mass index (BMI) 25.0-25.9, adult: Secondary | ICD-10-CM | POA: Diagnosis not present

## 2021-10-26 DIAGNOSIS — H02889 Meibomian gland dysfunction of unspecified eye, unspecified eyelid: Secondary | ICD-10-CM | POA: Diagnosis not present

## 2021-11-01 DIAGNOSIS — E049 Nontoxic goiter, unspecified: Secondary | ICD-10-CM | POA: Diagnosis not present

## 2021-11-01 DIAGNOSIS — E042 Nontoxic multinodular goiter: Secondary | ICD-10-CM | POA: Diagnosis not present

## 2021-11-02 DIAGNOSIS — H524 Presbyopia: Secondary | ICD-10-CM | POA: Diagnosis not present

## 2021-12-07 DIAGNOSIS — R1013 Epigastric pain: Secondary | ICD-10-CM | POA: Diagnosis not present

## 2021-12-07 DIAGNOSIS — K449 Diaphragmatic hernia without obstruction or gangrene: Secondary | ICD-10-CM | POA: Diagnosis not present

## 2021-12-07 DIAGNOSIS — R059 Cough, unspecified: Secondary | ICD-10-CM | POA: Diagnosis not present

## 2021-12-07 DIAGNOSIS — R590 Localized enlarged lymph nodes: Secondary | ICD-10-CM | POA: Diagnosis not present

## 2021-12-07 DIAGNOSIS — R0602 Shortness of breath: Secondary | ICD-10-CM | POA: Diagnosis not present

## 2021-12-07 DIAGNOSIS — R0600 Dyspnea, unspecified: Secondary | ICD-10-CM | POA: Diagnosis not present

## 2021-12-07 DIAGNOSIS — D72819 Decreased white blood cell count, unspecified: Secondary | ICD-10-CM | POA: Diagnosis not present

## 2021-12-07 DIAGNOSIS — I249 Acute ischemic heart disease, unspecified: Secondary | ICD-10-CM | POA: Diagnosis not present

## 2021-12-07 DIAGNOSIS — R0789 Other chest pain: Secondary | ICD-10-CM | POA: Diagnosis not present

## 2021-12-07 DIAGNOSIS — R079 Chest pain, unspecified: Secondary | ICD-10-CM | POA: Diagnosis not present

## 2021-12-07 DIAGNOSIS — Z20822 Contact with and (suspected) exposure to covid-19: Secondary | ICD-10-CM | POA: Diagnosis not present

## 2021-12-07 DIAGNOSIS — R0609 Other forms of dyspnea: Secondary | ICD-10-CM | POA: Diagnosis not present

## 2021-12-07 DIAGNOSIS — D649 Anemia, unspecified: Secondary | ICD-10-CM | POA: Diagnosis not present

## 2021-12-08 DIAGNOSIS — D72819 Decreased white blood cell count, unspecified: Secondary | ICD-10-CM | POA: Diagnosis not present

## 2021-12-08 DIAGNOSIS — R0600 Dyspnea, unspecified: Secondary | ICD-10-CM | POA: Diagnosis not present

## 2021-12-08 DIAGNOSIS — R590 Localized enlarged lymph nodes: Secondary | ICD-10-CM | POA: Diagnosis not present

## 2021-12-08 DIAGNOSIS — R079 Chest pain, unspecified: Secondary | ICD-10-CM | POA: Diagnosis not present

## 2021-12-08 DIAGNOSIS — D649 Anemia, unspecified: Secondary | ICD-10-CM | POA: Diagnosis not present

## 2021-12-08 DIAGNOSIS — R0609 Other forms of dyspnea: Secondary | ICD-10-CM | POA: Diagnosis not present

## 2021-12-08 DIAGNOSIS — R1013 Epigastric pain: Secondary | ICD-10-CM | POA: Diagnosis not present

## 2021-12-12 ENCOUNTER — Other Ambulatory Visit: Payer: Self-pay

## 2021-12-12 ENCOUNTER — Other Ambulatory Visit: Payer: Self-pay | Admitting: Hematology and Oncology

## 2021-12-12 ENCOUNTER — Inpatient Hospital Stay (INDEPENDENT_AMBULATORY_CARE_PROVIDER_SITE_OTHER): Payer: Medicare HMO | Admitting: Oncology

## 2021-12-12 ENCOUNTER — Inpatient Hospital Stay: Payer: Medicare HMO | Attending: Oncology

## 2021-12-12 ENCOUNTER — Other Ambulatory Visit: Payer: Self-pay | Admitting: Oncology

## 2021-12-12 DIAGNOSIS — R591 Generalized enlarged lymph nodes: Secondary | ICD-10-CM | POA: Diagnosis not present

## 2021-12-12 DIAGNOSIS — D759 Disease of blood and blood-forming organs, unspecified: Secondary | ICD-10-CM

## 2021-12-12 DIAGNOSIS — R590 Localized enlarged lymph nodes: Secondary | ICD-10-CM

## 2021-12-12 DIAGNOSIS — D649 Anemia, unspecified: Secondary | ICD-10-CM | POA: Diagnosis not present

## 2021-12-12 DIAGNOSIS — C50212 Malignant neoplasm of upper-inner quadrant of left female breast: Secondary | ICD-10-CM | POA: Diagnosis not present

## 2021-12-12 LAB — CBC AND DIFFERENTIAL
HCT: 35 — AB (ref 36–46)
Hemoglobin: 11.4 — AB (ref 12.0–16.0)
Neutrophils Absolute: 1.89
Platelets: 173 (ref 150–399)
WBC: 2.9

## 2021-12-12 LAB — CBC: RBC: 3.74 — AB (ref 3.87–5.11)

## 2021-12-13 DIAGNOSIS — R591 Generalized enlarged lymph nodes: Secondary | ICD-10-CM | POA: Insufficient documentation

## 2021-12-13 NOTE — Progress Notes (Signed)
[facility]  [address] [place],  [place]  [address] [phone]  Clinic Day:  12/12/2021  Referring physician: Cyndy Freeze, MD   HISTORY OF PRESENT ILLNESS:  The patient is a 76 y.o. female who I have seen the past for leukopenia and anemia.  As her counts were stable, she has been followed conservatively.  However, the patient comes in today as recent CT scans done in the emergency room unexpectedly showed right-sided axillary mediastinal, and hilar lymphadenopathy.  Of note, this CT was done due to her having intermittent chest pain.  The patient denies ever noticing any peripheral lymphadenopathy.  She also denies having any B symptoms which have alerted her to a potential hematologic malignancy, such as lymphoma, being present.  She also denies having any particular changes in her right breast.  Of note, her last mammogram in November 2020 was negative.  PHYSICAL EXAM:  Blood pressure (!) 145/66, pulse 100, temperature 98.5 F (36.9 C), resp. rate 14, height 5' 3.75" (1.619 m), weight 153 lb (69.4 kg), SpO2 96 %. Wt Readings from Last 3 Encounters:  12/12/21 153 lb (69.4 kg)  07/03/19 150 lb (68 kg)  11/01/15 170 lb (77.1 kg)   Body mass index is 26.47 kg/m. Performance status (ECOG): 1 - Symptomatic but completely ambulatory Physical Exam Constitutional:      Appearance: Normal appearance.  HENT:     Mouth/Throat:     Pharynx: Oropharynx is clear. No oropharyngeal exudate.  Cardiovascular:     Rate and Rhythm: Normal rate and regular rhythm.     Heart sounds: No murmur heard.   No friction rub. No gallop.  Pulmonary:     Breath sounds: Normal breath sounds.  Chest:  Breasts:    Right: No swelling, bleeding, inverted nipple, mass, nipple discharge or skin change.     Left: No swelling, bleeding, inverted nipple, mass, nipple discharge or skin change.  Abdominal:     General: Bowel sounds are normal. There is no distension.      Palpations: Abdomen is soft. There is no mass.     Tenderness: There is no abdominal tenderness.  Musculoskeletal:        General: No tenderness.     Cervical back: Normal range of motion and neck supple.     Right lower leg: No edema.     Left lower leg: No edema.  Lymphadenopathy:     Cervical: No cervical adenopathy.     Right cervical: No superficial, deep or posterior cervical adenopathy.    Left cervical: No superficial, deep or posterior cervical adenopathy.     Upper Body:     Right upper body: No supraclavicular or axillary adenopathy.     Left upper body: No supraclavicular or axillary adenopathy.     Lower Body: No right inguinal adenopathy. No left inguinal adenopathy.  Skin:    Coloration: Skin is not jaundiced.     Findings: No lesion or rash.  Neurological:     General: No focal deficit present.     Mental Status: She is alert and oriented to person, place, and time. Mental status is at baseline.  Psychiatric:        Mood and Affect: Mood normal.        Behavior: Behavior normal.        Thought Content: Thought content normal.        Judgment: Judgment normal.   LABS:   CBC Latest Ref Rng & Units  12/12/2021 07/03/2019  WBC - 2.9 4.7  Hemoglobin 12.0 - 16.0 11.4(A) 10.9(L)  Hematocrit 36 - 46 35(A) 34.1(L)  Platelets 150 - 399 173 161   CMP Latest Ref Rng & Units 07/03/2019  Glucose 70 - 99 mg/dL 124(H)  BUN 8 - 23 mg/dL 21  Creatinine 0.44 - 1.00 mg/dL 1.23(H)  Sodium 135 - 145 mmol/L 140  Potassium 3.5 - 5.1 mmol/L 4.1  Chloride 98 - 111 mmol/L 104  CO2 22 - 32 mmol/L 26  Calcium 8.9 - 10.3 mg/dL 9.0   STUDIES:  Her chest CT done on December 07, 2021 revealed the following:  FINDINGS:  Cardiovascular: Normal cardiac size.No pericardial disease.Normal  size main and branch pulmonary arteries.There is no evidence of  pulmonary embolism. There is moderate aortic arch and descending  aorta atherosclerotic calcifications.  Mediastinum/Nodes: There is  mediastinal and left hilar adenopathy.  For reference, pretracheal lymph node measures 1.2 cm (series 3,  image 74). Left hilar lymph node measures 1.2 cm short axis (series  3, image 92).  There is bulky right axillary lymphadenopathy. For reference, right  axillary lymph node measures 1 lymphadenopathy, largest measuring up  to 2.1 cm (series 3, image 62).  Enlarged right thyroid gland with heterogeneous nodule measuring at  least 3.0 cm, which was previously biopsied in 2016.Small hiatal  hernia.The trachea is unremarkable.  Lungs/Pleura: No focal airspace consolidation.No suspicious pulmonary nodules or masses.No pleural effusion.No pneumothorax.  Upper Abdomen: Prior cholecystectomy. Pancreatic atrophy. No ductal  dilation.  Musculoskeletal: No acute osseous abnormality.No suspicious lytic or  blastic lesions. Multilevel degenerative changes of the spine.  Review of the MIP images confirms the above findings.   IMPRESSION:  Bulky right axillary lymphadenopathy and smaller but technically  enlarged mediastinal and left hilar lymph nodes. Findings are  concerning for potential lymphoproliferative disease including  lymphoma. Also recommend correlation with mammography given  asymmetric right axillary involvement.  No evidence of pulmonary embolism or focal airspace consolidation.  Aortic Atherosclerosis (ICD10-I70.0).   ASSESSMENT & PLAN:  Assessment/Plan:  A 76 y.o. female with recent CT scan findings that are highly concerning for a lymphoma being present.  Per her physical exam today, I actually did not feel any of the right axillary lymph nodes that were appreciated on CT scan.  Furthermore, I did not feel a mass in her right breast.  For completeness, this patient will undergo a diagnostic mammogram to ensure that her right axillary lymphadenopathy is not due to an occult breast primary.  This appears unlikely.  I will also have the patient see Dr. Lovie Macadamia in the forthcoming days  to undergo an excisional lymph node biopsy of one of her right axillary lymph nodes, which should ultimately prove what her underlying diagnosis is.  I will see her back in approximately 1 month to go over all of these results and their implications.  The patient understands all the plans discussed today and is in agreement with them.      Eriverto Byrnes Macarthur Critchley, MD

## 2021-12-14 ENCOUNTER — Telehealth: Payer: Self-pay | Admitting: Oncology

## 2021-12-14 NOTE — Telephone Encounter (Signed)
Patient has been scheduled for her Diagn. Mammo for 12/19/21 @ 8 am ; Check in @ 7:30 and for her follow-up visit. Patient has been notified of all appt dates and times.

## 2021-12-18 DIAGNOSIS — R809 Proteinuria, unspecified: Secondary | ICD-10-CM | POA: Diagnosis not present

## 2021-12-18 DIAGNOSIS — Z1331 Encounter for screening for depression: Secondary | ICD-10-CM | POA: Diagnosis not present

## 2021-12-18 DIAGNOSIS — K219 Gastro-esophageal reflux disease without esophagitis: Secondary | ICD-10-CM | POA: Diagnosis not present

## 2021-12-18 DIAGNOSIS — E1129 Type 2 diabetes mellitus with other diabetic kidney complication: Secondary | ICD-10-CM | POA: Diagnosis not present

## 2021-12-18 DIAGNOSIS — R590 Localized enlarged lymph nodes: Secondary | ICD-10-CM | POA: Diagnosis not present

## 2021-12-18 DIAGNOSIS — R053 Chronic cough: Secondary | ICD-10-CM | POA: Diagnosis not present

## 2021-12-18 DIAGNOSIS — J45991 Cough variant asthma: Secondary | ICD-10-CM | POA: Diagnosis not present

## 2021-12-18 DIAGNOSIS — Z7689 Persons encountering health services in other specified circumstances: Secondary | ICD-10-CM | POA: Diagnosis not present

## 2021-12-18 DIAGNOSIS — R7989 Other specified abnormal findings of blood chemistry: Secondary | ICD-10-CM | POA: Diagnosis not present

## 2021-12-18 DIAGNOSIS — N1831 Chronic kidney disease, stage 3a: Secondary | ICD-10-CM | POA: Diagnosis not present

## 2021-12-19 DIAGNOSIS — R922 Inconclusive mammogram: Secondary | ICD-10-CM | POA: Diagnosis not present

## 2021-12-19 DIAGNOSIS — R590 Localized enlarged lymph nodes: Secondary | ICD-10-CM | POA: Diagnosis not present

## 2021-12-21 ENCOUNTER — Telehealth: Payer: Self-pay

## 2021-12-21 NOTE — Telephone Encounter (Addendum)
Jolyne Laye,RN: Pt notified of below and verbalized understanding.  Dr Bobby Rumpf:  We went over all of this at her last visit.  Everything is going according to plan.  The surgeon will likely remove an entire lymph node for diagnosis, not just a needle biopsy.  THIS WAS DISCUSSED AT HER LAST VISIT.  Latravia Southgate,RN: I spoke with pt. She had a mammogram on Tuesday. They recommended a needle biopsy, but then the radiologist decided she needed lymph node removal?!? Pt has an appt with Dr Coralie Keens on 12/28/2021. She wants to make sure Dr Bobby Rumpf is aware of this and that she is doing the right thing.

## 2021-12-25 ENCOUNTER — Encounter: Payer: Self-pay | Admitting: Oncology

## 2021-12-28 DIAGNOSIS — R590 Localized enlarged lymph nodes: Secondary | ICD-10-CM | POA: Diagnosis not present

## 2022-01-08 DIAGNOSIS — C8194 Hodgkin lymphoma, unspecified, lymph nodes of axilla and upper limb: Secondary | ICD-10-CM | POA: Diagnosis not present

## 2022-01-08 DIAGNOSIS — L042 Acute lymphadenitis of upper limb: Secondary | ICD-10-CM | POA: Diagnosis not present

## 2022-01-08 DIAGNOSIS — Z7984 Long term (current) use of oral hypoglycemic drugs: Secondary | ICD-10-CM | POA: Diagnosis not present

## 2022-01-08 DIAGNOSIS — R590 Localized enlarged lymph nodes: Secondary | ICD-10-CM | POA: Diagnosis not present

## 2022-01-08 DIAGNOSIS — C8114 Nodular sclerosis classical Hodgkin lymphoma, lymph nodes of axilla and upper limb: Secondary | ICD-10-CM | POA: Diagnosis not present

## 2022-01-08 DIAGNOSIS — J45909 Unspecified asthma, uncomplicated: Secondary | ICD-10-CM | POA: Diagnosis not present

## 2022-01-08 DIAGNOSIS — E119 Type 2 diabetes mellitus without complications: Secondary | ICD-10-CM | POA: Diagnosis not present

## 2022-01-08 NOTE — Progress Notes (Signed)
[facility]  [address] [place],  [place]  [address] [phone]  Clinic Day:  01/12/2022  Referring physician: Cyndy Freeze, MD  This document serves as a record of services personally performed by Melanie Dube Macarthur Critchley, MD. It was created on their behalf by [facility], a trained medical scribe. The creation of this record is based on the scribe's personal observations and the provider's statements to them.  HISTORY OF PRESENT ILLNESS:  The patient is a 76 y.o. female who comes in today to go over the pathologic results of her right axillary lymph node biopsy. Recent CT scans done in the emergency room unexpectedly showed right-sided axillary, mediastinal, and hilar lymphadenopathy.  Overall, the patient claims to be doing well.  She is very nervous of what her pathology may reveal.  However, she denies having personally felt any significant peripheral lymphadenopathy or having any B symptoms which have alerted her to an aggressive hematologic malignancy being present.  PHYSICAL EXAM:  Blood pressure (!) 183/78, pulse (!) 108, temperature 98.9 F (37.2 C), resp. rate 14, height 5' 3.75" (1.619 m), weight 146 lb 6.4 oz (66.4 kg), SpO2 99 %. Wt Readings from Last 3 Encounters:  01/18/22 146 lb 14.4 oz (66.6 kg)  01/12/22 146 lb 6.4 oz (66.4 kg)  12/12/21 153 lb (69.4 kg)   Body mass index is 25.33 kg/m. Performance status (ECOG): 1 - Symptomatic but completely ambulatory Physical Exam Constitutional:      Appearance: Normal appearance.  HENT:     Mouth/Throat:     Pharynx: Oropharynx is clear. No oropharyngeal exudate.  Cardiovascular:     Rate and Rhythm: Normal rate and regular rhythm.     Heart sounds: No murmur heard.   No friction rub. No gallop.  Pulmonary:     Breath sounds: Normal breath sounds.  Chest:  Breasts:    Right: No swelling, bleeding, inverted nipple, mass, nipple discharge or skin change.     Left: No swelling,  bleeding, inverted nipple, mass, nipple discharge or skin change.  Abdominal:     General: Bowel sounds are normal. There is no distension.     Palpations: Abdomen is soft. There is no mass.     Tenderness: There is no abdominal tenderness.  Musculoskeletal:        General: No tenderness.     Cervical back: Normal range of motion and neck supple.     Right lower leg: No edema.     Left lower leg: No edema.  Lymphadenopathy:     Cervical: No cervical adenopathy.     Right cervical: No superficial, deep or posterior cervical adenopathy.    Left cervical: No superficial, deep or posterior cervical adenopathy.     Upper Body:     Right upper body: No supraclavicular or axillary adenopathy.     Left upper body: No supraclavicular or axillary adenopathy.     Lower Body: No right inguinal adenopathy. No left inguinal adenopathy.  Skin:    Coloration: Skin is not jaundiced.     Findings: No lesion or rash.  Neurological:     General: No focal deficit present.     Mental Status: She is alert and oriented to person, place, and time. Mental status is at baseline.  Psychiatric:        Mood and Affect: Mood normal.        Behavior: Behavior normal.        Thought Content: Thought content normal.  Judgment: Judgment normal.   PATHOLOGY:   ASSESSMENT & PLAN:  Assessment/Plan:  A 76 y.o. 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  In clinic today, I went over her biopsy results and their implications with her.  Understandably, the patient and her family were concerned about the diagnosis.  She understands some form of systemic therapy will be necessary.  However, before that is discussed, I will have this patient undergo a PET scan to determine the stage of her disease.  The stage of her disease will factor into the treatment regimen used and the number of cycles necessary.  Her PET scan will be scheduled next week.  I will see her back a  day later to go over the images and their implications.  Although somewhat despondent, the patient understands all the plans discussed today and is in agreement with them.     I, Rita Ohara, am acting as scribe for Marice Potter, MD    I have reviewed this report as typed by the medical scribe, and it is complete and accurate.  Melanie Nevils Macarthur Critchley, MD

## 2022-01-12 ENCOUNTER — Inpatient Hospital Stay: Payer: Medicare HMO | Attending: Oncology | Admitting: Oncology

## 2022-01-12 ENCOUNTER — Other Ambulatory Visit: Payer: Self-pay | Admitting: Oncology

## 2022-01-12 ENCOUNTER — Other Ambulatory Visit: Payer: Self-pay

## 2022-01-12 VITALS — BP 183/78 | HR 108 | Temp 98.9°F | Resp 14 | Ht 63.75 in | Wt 146.4 lb

## 2022-01-12 DIAGNOSIS — C8118 Nodular sclerosis classical Hodgkin lymphoma, lymph nodes of multiple sites: Secondary | ICD-10-CM

## 2022-01-12 DIAGNOSIS — C8190 Hodgkin lymphoma, unspecified, unspecified site: Secondary | ICD-10-CM | POA: Insufficient documentation

## 2022-01-16 ENCOUNTER — Other Ambulatory Visit: Payer: Self-pay | Admitting: Oncology

## 2022-01-16 ENCOUNTER — Telehealth: Payer: Self-pay | Admitting: Oncology

## 2022-01-16 DIAGNOSIS — C8110 Nodular sclerosis classical Hodgkin lymphoma, unspecified site: Secondary | ICD-10-CM

## 2022-01-16 MED ORDER — LORAZEPAM 1 MG PO TABS: ORAL_TABLET | ORAL | 0 refills | Status: DC

## 2022-01-16 NOTE — Telephone Encounter (Signed)
CT C/A/P has been Scheduled for 01/17/22 @ 6:45 am ; check in at 6:15 am  Notified pt of date,time and instructions.

## 2022-01-17 DIAGNOSIS — K5730 Diverticulosis of large intestine without perforation or abscess without bleeding: Secondary | ICD-10-CM | POA: Diagnosis not present

## 2022-01-17 DIAGNOSIS — S40011A Contusion of right shoulder, initial encounter: Secondary | ICD-10-CM | POA: Diagnosis not present

## 2022-01-17 DIAGNOSIS — C8190 Hodgkin lymphoma, unspecified, unspecified site: Secondary | ICD-10-CM | POA: Diagnosis not present

## 2022-01-17 DIAGNOSIS — C8110 Nodular sclerosis classical Hodgkin lymphoma, unspecified site: Secondary | ICD-10-CM | POA: Diagnosis not present

## 2022-01-17 DIAGNOSIS — S2020XA Contusion of thorax, unspecified, initial encounter: Secondary | ICD-10-CM | POA: Diagnosis not present

## 2022-01-18 ENCOUNTER — Inpatient Hospital Stay: Payer: Medicare HMO | Admitting: Oncology

## 2022-01-18 ENCOUNTER — Encounter: Payer: Self-pay | Admitting: Oncology

## 2022-01-18 ENCOUNTER — Telehealth: Payer: Self-pay | Admitting: Oncology

## 2022-01-18 ENCOUNTER — Other Ambulatory Visit: Payer: Self-pay | Admitting: Oncology

## 2022-01-18 ENCOUNTER — Inpatient Hospital Stay: Payer: Medicare HMO

## 2022-01-18 ENCOUNTER — Other Ambulatory Visit: Payer: Self-pay

## 2022-01-18 DIAGNOSIS — C8118 Nodular sclerosis classical Hodgkin lymphoma, lymph nodes of multiple sites: Secondary | ICD-10-CM

## 2022-01-18 DIAGNOSIS — C8198 Hodgkin lymphoma, unspecified, lymph nodes of multiple sites: Secondary | ICD-10-CM | POA: Insufficient documentation

## 2022-01-18 DIAGNOSIS — C8190 Hodgkin lymphoma, unspecified, unspecified site: Secondary | ICD-10-CM | POA: Diagnosis not present

## 2022-01-18 DIAGNOSIS — R591 Generalized enlarged lymph nodes: Secondary | ICD-10-CM | POA: Diagnosis not present

## 2022-01-18 DIAGNOSIS — D649 Anemia, unspecified: Secondary | ICD-10-CM | POA: Diagnosis not present

## 2022-01-18 LAB — BASIC METABOLIC PANEL
BUN: 27 — AB (ref 4–21)
CO2: 25 — AB (ref 13–22)
Chloride: 108 (ref 99–108)
Creatinine: 1.1 (ref 0.5–1.1)
Glucose: 151
Potassium: 4.2 (ref 3.4–5.3)
Sodium: 140 (ref 137–147)

## 2022-01-18 LAB — SEDIMENTATION RATE: Sed Rate: 59 mm/hr — ABNORMAL HIGH (ref 0–22)

## 2022-01-18 LAB — CBC AND DIFFERENTIAL
HCT: 29 — AB (ref 36–46)
Hemoglobin: 9.9 — AB (ref 12.0–16.0)
Neutrophils Absolute: 1.91
Platelets: 166 (ref 150–399)
WBC: 2.9

## 2022-01-18 LAB — HEPATIC FUNCTION PANEL
ALT: 14 (ref 7–35)
AST: 20 (ref 13–35)
Alkaline Phosphatase: 118 (ref 25–125)
Bilirubin, Total: 0.4

## 2022-01-18 LAB — COMPREHENSIVE METABOLIC PANEL
Albumin: 3.7 (ref 3.5–5.0)
Calcium: 9.3 (ref 8.7–10.7)

## 2022-01-18 LAB — CBC: RBC: 3.27 — AB (ref 3.87–5.11)

## 2022-01-18 NOTE — Telephone Encounter (Signed)
01/18/22 Left msg-Echo scheduled at [facility] on 01/22/22@3pm 

## 2022-01-18 NOTE — Progress Notes (Unsigned)
Per [employer] web-page: No PA required for ECHO  CPT code: 93306    The procedure code you entered was not found on the [facility] Participating Provider Medical Precertification List. If youre a participating provider, no precertification is required when this service is performed as an outpatient procedure for a medical or surgical diagnosis.

## 2022-01-18 NOTE — Progress Notes (Signed)
START ON PATHWAY REGIMEN - Lymphoma and CLL     Cycles 1 and 2: A cycle is every 21 days:     Brentuximab vedotin    Cycles 3 through 8: A cycle is every 28 days:     Dacarbazine      Doxorubicin      Vinblastine   **Always confirm dose/schedule in your pharmacy ordering system**  Patient Characteristics: Classical Hodgkin Lymphoma, First Line, Stage III / IV, Age ? 62 Disease Type: Not Applicable Disease Type: Not Applicable Disease Type: Classical Hodgkin Lymphoma Line of therapy: First Line Age: ? 58 Intent of Therapy: Curative Intent, Discussed with Patient

## 2022-01-21 ENCOUNTER — Encounter: Payer: Self-pay | Admitting: Oncology

## 2022-01-21 NOTE — Progress Notes (Signed)
[facility]  [address] [place],  [place]  [address] [phone]  Clinic Day:  01/12/2022  Referring physician: Cyndy Freeze, MD  This document serves as a record of services personally performed by Nikita Surman Macarthur Critchley, MD. It was created on their behalf by [facility] E, a trained medical scribe. The creation of this record is based on the scribe's personal observations and the provider's statements to them.  HISTORY OF PRESENT ILLNESS:  The patient is a 76 y.o. female with newly diagnosed Hodgkin lymphoma.  She comes in today to go over her PET scan images to determine her disease staging, as well as to formulate her treatment plan for her disease management.  Overall, the patient claims to be doing well.  Despite not noticing any significant peripheral lymphadenopathy or having any B symptoms, she is extremely nervous as to the treatment she needs to undergo to hopefully cure her of her underlying lymphoma.   PHYSICAL EXAM:  Blood pressure (!) 151/65, pulse (!) 110, temperature 98.8 F (37.1 C), resp. rate 14, height 5' 3.75" (1.619 m), weight 146 lb 14.4 oz (66.6 kg), SpO2 100 %. Wt Readings from Last 3 Encounters:  01/18/22 146 lb 14.4 oz (66.6 kg)  01/12/22 146 lb 6.4 oz (66.4 kg)  12/12/21 153 lb (69.4 kg)   Body mass index is 25.41 kg/m. Performance status (ECOG): 1 - Symptomatic but completely ambulatory Physical Exam Constitutional:      Appearance: Normal appearance.  HENT:     Mouth/Throat:     Pharynx: Oropharynx is clear. No oropharyngeal exudate.  Cardiovascular:     Rate and Rhythm: Normal rate and regular rhythm.     Heart sounds: No murmur heard.   No friction rub. No gallop.  Pulmonary:     Breath sounds: Normal breath sounds.  Chest:  Breasts:    Right: No swelling, bleeding, inverted nipple, mass, nipple discharge or skin change.     Left: No swelling, bleeding, inverted nipple, mass, nipple discharge or skin change.   Abdominal:     General: Bowel sounds are normal. There is no distension.     Palpations: Abdomen is soft. There is no mass.     Tenderness: There is no abdominal tenderness.  Musculoskeletal:        General: No tenderness.     Cervical back: Normal range of motion and neck supple.     Right lower leg: No edema.     Left lower leg: No edema.  Lymphadenopathy:     Cervical: No cervical adenopathy.     Right cervical: No superficial, deep or posterior cervical adenopathy.    Left cervical: No superficial, deep or posterior cervical adenopathy.     Upper Body:     Right upper body: No supraclavicular or axillary adenopathy.     Left upper body: No supraclavicular or axillary adenopathy.     Lower Body: No right inguinal adenopathy. No left inguinal adenopathy.  Skin:    Coloration: Skin is not jaundiced.     Findings: No lesion or rash.  Neurological:     General: No focal deficit present.     Mental Status: She is alert and oriented to person, place, and time. Mental status is at baseline.  Psychiatric:        Mood and Affect: Mood normal.        Behavior: Behavior normal.        Thought Content: Thought content normal.  Judgment: Judgment normal.   SCANS:  Her PET scan done yesterday revealed the following:  FINDINGS:  Mediastinal blood pool activity: SUV max 2.4  Liver activity: SUV max NA  NECK: No hypermetabolic lymph nodes in the neck.  Incidental CT findings: none  CHEST: Enlarged RIGHT axillary lymph nodes have intense metabolic  activity SUV max equal 17.2. Postsurgical change in the RIGHT axilla  consistent with excisional biopsy of lymph node. Subcutaneous  hematoma in the RIGHT chest wall measures 8.2 x 2.7 cm (image  89/series 3).  Mildly enlarged intensely hypermetabolic mediastinal lymph nodes are  present. For example precarinal lymph node measuring 16 mm with SUV  max equal 15.2. No hypermetabolic supraclavicular nodes. No  hypermetabolic LEFT  axillary nodes.  Incidental CT findings: No suspicious pulmonary nodules.  ABDOMEN/PELVIS: Several small hypermetabolic lymph nodes present in  the upper abdomen. Lymph node deep to the IVC measuring 11 mm with  SUV max equal 8.2 on image 138. Celiac lymph node measuring 9 mm  with SUV max equal 7.9 on image 130.  Spleen is normal volume and normal metabolic activity. No  hypermetabolic pelvic lymph nodes or inguinal lymph nodes.  Incidental CT findings: Atherosclerotic calcification of the aorta.  Benign cyst of the LEFT kidney. LEFT colon diverticulosis  SKELETON: No focal hypermetabolic activity to suggest skeletal  metastasis.  Incidental CT findings: none   IMPRESSION:  1. Hypermetabolic RIGHT axillary lymph nodes and mediastinal lymph  nodes consistent with lymphoma.  2. Several smaller hypermetabolic lymph nodes in the upper abdomen.  3. Normal spleen and normal bone marrow.  4. Postsurgical change in the RIGHT axilla with subcutaneous  hematoma.   ASSESSMENT & PLAN:  Assessment/Plan:  A 76 y.o. female who unfortunately appears to have stage IIIA classical Hodgkin lymphoma, nodular sclerosis subtype.  This is based upon her PET scan images showing lymphadenopathy above and below her diaphragm.  Based upon the staging and her age, the treatment plan that will be implemented for her definitive disease management will initially consist of 2 cycles of brentuximab vedotin.  Afterwards, 6 cycles of Adriamycin/vinblastine/dacarbazine will be given, with each cycle consisting of days 1 and 15 of treatment.  The patient was made aware of the side effects which go along with this treatment regimen, including peripheral neuropathy, cytopenias, and alopecia.  I will arrange for her to have an echocardiogram to ensure there is no depressed cardiac ejection fraction before she starts the Adriamycin component of her therapy.  I will also arrange for her to have a port placed through which all of her  treatments will be given.  Her first of 2 cycles of brentuximab vedotin will commence on Friday, March 3rd.  I will see her back 3 weeks later before she heads into her second cycle of brentuximab.  Once again, I explained to the patient and her family that all therapy to be given is with the goal of curative intent.  The patient understands all the plans discussed today and is in agreement with them.     I, Rita Ohara, am acting as scribe for Marice Potter, MD    I have reviewed this report as typed by the medical scribe, and it is complete and accurate.  Timur Nibert Macarthur Critchley, MD

## 2022-01-22 ENCOUNTER — Other Ambulatory Visit: Payer: Self-pay

## 2022-01-22 ENCOUNTER — Ambulatory Visit (INDEPENDENT_AMBULATORY_CARE_PROVIDER_SITE_OTHER): Payer: Medicare HMO

## 2022-01-22 DIAGNOSIS — Z9221 Personal history of antineoplastic chemotherapy: Secondary | ICD-10-CM

## 2022-01-22 DIAGNOSIS — C8118 Nodular sclerosis classical Hodgkin lymphoma, lymph nodes of multiple sites: Secondary | ICD-10-CM | POA: Diagnosis not present

## 2022-01-22 LAB — ECHOCARDIOGRAM COMPLETE
Area-P 1/2: 5.54 cm2
S' Lateral: 2.4 cm

## 2022-01-24 ENCOUNTER — Inpatient Hospital Stay: Payer: Medicare HMO | Attending: Oncology | Admitting: Hematology and Oncology

## 2022-01-24 ENCOUNTER — Encounter: Payer: Self-pay | Admitting: Hematology and Oncology

## 2022-01-24 ENCOUNTER — Other Ambulatory Visit: Payer: Self-pay

## 2022-01-24 VITALS — BP 139/61 | HR 101 | Temp 98.6°F | Resp 20 | Ht 62.5 in | Wt 145.0 lb

## 2022-01-24 DIAGNOSIS — C8190 Hodgkin lymphoma, unspecified, unspecified site: Secondary | ICD-10-CM | POA: Insufficient documentation

## 2022-01-24 DIAGNOSIS — C8118 Nodular sclerosis classical Hodgkin lymphoma, lymph nodes of multiple sites: Secondary | ICD-10-CM | POA: Diagnosis not present

## 2022-01-24 DIAGNOSIS — Z5112 Encounter for antineoplastic immunotherapy: Secondary | ICD-10-CM | POA: Insufficient documentation

## 2022-01-24 DIAGNOSIS — Z5189 Encounter for other specified aftercare: Secondary | ICD-10-CM | POA: Insufficient documentation

## 2022-01-24 DIAGNOSIS — Z79899 Other long term (current) drug therapy: Secondary | ICD-10-CM | POA: Insufficient documentation

## 2022-01-24 MED ORDER — LORAZEPAM 0.5 MG PO TABS: 0.5000 mg | ORAL_TABLET | Freq: Four times a day (QID) | ORAL | 0 refills | Status: DC | PRN

## 2022-01-24 MED ORDER — DEXAMETHASONE 4 MG PO TABS: 8.0000 mg | ORAL_TABLET | Freq: Every day | ORAL | 5 refills | Status: DC

## 2022-01-24 MED ORDER — PROCHLORPERAZINE MALEATE 10 MG PO TABS: 10.0000 mg | ORAL_TABLET | Freq: Four times a day (QID) | ORAL | 1 refills | Status: DC | PRN

## 2022-01-24 MED ORDER — LORAZEPAM 1 MG PO TABS: 1.0000 mg | ORAL_TABLET | Freq: Three times a day (TID) | ORAL | 0 refills | Status: DC

## 2022-01-24 MED ORDER — LIDOCAINE-PRILOCAINE 2.5-2.5 % EX CREA: TOPICAL_CREAM | CUTANEOUS | 3 refills | Status: DC

## 2022-01-24 MED ORDER — ONDANSETRON HCL 8 MG PO TABS: 8.0000 mg | ORAL_TABLET | Freq: Two times a day (BID) | ORAL | 1 refills | Status: DC | PRN

## 2022-01-24 NOTE — Progress Notes (Cosign Needed Addendum)
CHEMO CARE CLINIC CONSULT NOTE  Patient Care Team: Buckner Malta, MD as PCP - General (Family Medicine) Weston Settle, MD as Consulting Physician (Hematology and Oncology)   Name of the patient: Melanie Miles  [id number]  09-09-1946   Date of visit: 01/24/22  Diagnosis- Hodgkin's lymphoma  Chief complaint/Reason for visit- Initial Meeting for [facility], preparing for starting chemotherapy   Heme/Onc history:  Oncology History  Hodgkin lymphoma of lymph nodes of multiple regions (HCC)  01/18/2022 Initial Diagnosis   Hodgkin lymphoma of lymph nodes of multiple regions (HCC)   01/26/2022 -  Chemotherapy   Patient is on Treatment Plan :  HODGKINS LYMPHOMA Sequential A + AVD (Brentuximab q21d / Doxorubicin + Vinblastine + Dacarbazine q28d)        Interval history-  Patient presents to chemo care clinic today for initial meeting in preparation for starting chemotherapy. I introduced the chemo care clinic and we discussed that the role of the clinic is to assist those who are at an increased risk of emergency room visits and/or complications during the course of chemotherapy treatment. We discussed that the increased risk takes into account factors such as age, performance status, and co-morbidities. We also discussed that for some, this might include barriers to care such as not having a primary care provider, lack of insurance/transportation, or not being able to afford medications. We discussed that the goal of the program is to help prevent unplanned ER visits and help reduce complications during chemotherapy. We do this by discussing specific risk factors to each individual and identifying ways that we can help improve these risk factors and reduce barriers to care.   Allergies  Allergen Reactions   Penicillins Itching and Hives   Pyridium [Phenazopyridine Hcl] Other (See Comments) and Anaphylaxis    Could not breathe   Ciprofloxacin Nausea And Vomiting   Stadol  [Butorphanol] Other (See Comments)    unknown   Levofloxacin Itching    Past Medical History:  Diagnosis Date   Asthma    Carpal tunnel syndrome    Diabetes (HCC)    TMJ disease     Past Surgical History:  Procedure Laterality Date   APPENDECTOMY     CHOLECYSTECTOMY     PARTIAL HYSTERECTOMY     SHOULDER SURGERY     TUMOR REMOVAL      Social History   Socioeconomic History   Marital status: Married    Spouse name: Not on file   Number of children: Not on file   Years of education: Not on file   Highest education level: Not on file  Occupational History   Not on file  Tobacco Use   Smoking status: Never   Smokeless tobacco: Never  Substance and Sexual Activity   Alcohol use: No   Drug use: Never   Sexual activity: Not on file  Other Topics Concern   Not on file  Social History Narrative   Not on file   Social Determinants of Health   Financial Resource Strain: Not on file  Food Insecurity: Not on file  Transportation Needs: Not on file  Physical Activity: Not on file  Stress: Not on file  Social Connections: Not on file  Intimate Partner Violence: Not on file    Family History  Problem Relation Age of Onset   Dementia Mother    Diabetes Mother    Breast cancer Sister    Rheum arthritis Sister    Diabetes Brother    Arthritis  Brother    Cancer Father    Arthritis Father    Cancer Maternal Grandmother    Cancer Maternal Grandfather    Asthma Paternal Grandmother    Arthritis Brother      Current Outpatient Medications:    albuterol (VENTOLIN HFA) 108 (90 BASE) MCG/ACT inhaler, Inhale into the lungs every 6 (six) hours as needed for wheezing or shortness of breath. Inhale two puffs every four to six hours as needed., Disp: , Rfl:    Blood Glucose Monitoring Suppl (ONETOUCH VERIO FLEX SYSTEM) w/Device KIT, CHECK BLOOD SUGAR ONCE A DAY, Disp: , Rfl:    doxycycline (VIBRAMYCIN) 100 MG capsule, Take 100 mg by mouth 2 (two) times daily., Disp: , Rfl:     ergocalciferol (VITAMIN D2) 1.25 MG (50000 UT) capsule, Take 1 capsule by mouth once a week., Disp: , Rfl:    estradiol (ESTRACE) 0.1 MG/GM vaginal cream, Place 1 Applicatorful vaginally at bedtime., Disp: , Rfl:    FLOVENT HFA 110 MCG/ACT inhaler, SMARTSIG:2 Puff(s) Via Inhaler Daily, Disp: , Rfl:    glucose blood (ONETOUCH ULTRA) test strip, , Disp: , Rfl:    hydroxychloroquine (PLAQUENIL) 200 MG tablet, Take 200 mg by mouth daily., Disp: , Rfl:    ketorolac (ACULAR) 0.5 % ophthalmic solution, SMARTSIG:In Eye(s), Disp: , Rfl:    Lancets (ONETOUCH DELICA PLUS LANCET33G) MISC, Apply topically 2 (two) times daily., Disp: , Rfl:    LORazepam (ATIVAN) 1 MG tablet, Take 45 minutes before scheduled procedure, Disp: 1 tablet, Rfl: 0   pantoprazole (PROTONIX) 40 MG tablet, Take 40 mg by mouth 2 (two) times daily., Disp: , Rfl:    pioglitazone (ACTOS) 30 MG tablet, Take 30 mg by mouth daily., Disp: , Rfl:    promethazine-dextromethorphan (PROMETHAZINE-DM) 6.25-15 MG/5ML syrup, TAKE 5 MILLILITER AT BEDTIME AS NEEDED FOR COUGH, Disp: , Rfl:   CMP Latest Ref Rng & Units 01/18/2022  Glucose 70 - 99 mg/dL -  BUN 4 - 21 16(X)  Creatinine 0.5 - 1.1 1.1  Sodium 137 - 147 140  Potassium 3.4 - 5.3 4.2  Chloride 99 - 108 108  CO2 13 - 22 25(A)  Calcium 8.7 - 10.7 9.3  Alkaline Phos 25 - 125 118  AST 13 - 35 20  ALT 7 - 35 14   CBC Latest Ref Rng & Units 01/18/2022  WBC - 2.9  Hemoglobin 12.0 - 16.0 9.9(A)  Hematocrit 36 - 46 29(A)  Platelets 150 - 399 166    No images are attached to the encounter.  ECHOCARDIOGRAM COMPLETE  Result Date: 01/22/2022    ECHOCARDIOGRAM REPORT   Patient Name:   MITZIE SCHILKE Date of Exam: 01/22/2022 Medical Rec #:  [id number]                    Height:       63.7 in Accession #:    [id number]                   Weight:       146.9 lb Date of Birth:  18-Nov-1946                    BSA:          1.711 m Patient Age:    76 years                     BP:  151/65 mmHg Patient Gender: F                            HR:           99 bpm. Exam Location:  [place] Procedure: 2D Echo, Cardiac Doppler, Color Doppler and Strain Analysis Indications:    Chemo Z09  History:        Patient has prior history of Echocardiogram examinations, most                 recent 12/12/2021.  Sonographer:    Louie Boston RDCS Referring Phys: [id number] DEQUINCY A LEWIS IMPRESSIONS  1. Sigmoid septum noted. GLS -12.2. Left ventricular ejection fraction, by estimation, is 60 to 65%. The left ventricle has normal function. The left ventricle has no regional wall motion abnormalities. There is mild left ventricular hypertrophy. Left ventricular diastolic parameters are consistent with Grade I diastolic dysfunction (impaired relaxation).  2. Right ventricular systolic function is normal. The right ventricular size is normal. There is normal pulmonary artery systolic pressure.  3. The mitral valve is normal in structure. No evidence of mitral valve regurgitation. No evidence of mitral stenosis.  4. The aortic valve is normal in structure. Aortic valve regurgitation is not visualized. No aortic stenosis is present.  5. The inferior vena cava is normal in size with greater than 50% respiratory variability, suggesting right atrial pressure of 3 mmHg. FINDINGS  Left Ventricle: Sigmoid septum noted. GLS -12.2. Left ventricular ejection fraction, by estimation, is 60 to 65%. The left ventricle has normal function. The left ventricle has no regional wall motion abnormalities. The left ventricular internal cavity size was normal in size. There is mild left ventricular hypertrophy. Left ventricular diastolic parameters are consistent with Grade I diastolic dysfunction (impaired relaxation). Right Ventricle: The right ventricular size is normal. No increase in right ventricular wall thickness. Right ventricular systolic function is normal. There is normal pulmonary artery systolic pressure. The tricuspid regurgitant  velocity is 2.07 m/s, and  with an assumed right atrial pressure of 3 mmHg, the estimated right ventricular systolic pressure is 20.1 mmHg. Left Atrium: Left atrial size was normal in size. Right Atrium: Right atrial size was normal in size. Pericardium: There is no evidence of pericardial effusion. Mitral Valve: The mitral valve is normal in structure. No evidence of mitral valve regurgitation. No evidence of mitral valve stenosis. Tricuspid Valve: The tricuspid valve is normal in structure. Tricuspid valve regurgitation is mild . No evidence of tricuspid stenosis. Aortic Valve: The aortic valve is normal in structure. Aortic valve regurgitation is not visualized. No aortic stenosis is present. Pulmonic Valve: The pulmonic valve was normal in structure. Pulmonic valve regurgitation is not visualized. No evidence of pulmonic stenosis. Aorta: The aortic root is normal in size and structure. Venous: The inferior vena cava is normal in size with greater than 50% respiratory variability, suggesting right atrial pressure of 3 mmHg. IAS/Shunts: No atrial level shunt detected by color flow Doppler.  LEFT VENTRICLE PLAX 2D LVIDd:         3.40 cm   Diastology LVIDs:         2.40 cm   LV e' medial:    5.11 cm/s LV PW:         1.20 cm   LV E/e' medial:  19.5 LV IVS:        1.20 cm   LV e' lateral:   7.62 cm/s LVOT diam:  2.00 cm   LV E/e' lateral: 13.1 LV SV:         68 LV SV Index:   40 LVOT Area:     3.14 cm  RIGHT VENTRICLE            IVC RV S prime:     6.53 cm/s  IVC diam: 1.50 cm TAPSE (M-mode): 1.7 cm LEFT ATRIUM             Index        RIGHT ATRIUM          Index LA diam:        3.20 cm 1.87 cm/m   RA Area:     9.72 cm LA Vol (A2C):   46.8 ml 27.36 ml/m  RA Volume:   16.00 ml 9.35 ml/m LA Vol (A4C):   20.1 ml 11.75 ml/m LA Biplane Vol: 31.2 ml 18.24 ml/m  AORTIC VALVE LVOT Vmax:   112.00 cm/s LVOT Vmean:  78.800 cm/s LVOT VTI:    0.218 m  AORTA Ao Root diam: 3.00 cm Ao Asc diam:  2.90 cm Ao Desc diam: 1.70  cm MITRAL VALVE                TRICUSPID VALVE MV Area (PHT): 5.54 cm     TR Peak grad:   17.1 mmHg MV Decel Time: 137 msec     TR Vmax:        207.00 cm/s MV E velocity: 99.50 cm/s MV A velocity: 122.00 cm/s  SHUNTS MV E/A ratio:  0.82         Systemic VTI:  0.22 m                             Systemic Diam: 2.00 cm Gypsy Balsam MD Electronically signed by Gypsy Balsam MD Signature Date/Time: 01/22/2022/4:32:41 PM    Final      Assessment and plan- Patient is a 76 y.o. female who presents to [facility] for initial meeting in preparation for starting chemotherapy for the treatment of Hodgkin's lymphoma.   Chemo Care Clinic/High Risk for ER/Hospitalization during chemotherapy- We discussed the role of the chemo care clinic and identified patient specific risk factors. I discussed that patient was identified as high risk primarily based on:  Patient has past medical history positive for: Past Medical History:  Diagnosis Date   Asthma    Carpal tunnel syndrome    Diabetes (HCC)    TMJ disease     Patient has past surgical history positive for: Past Surgical History:  Procedure Laterality Date   APPENDECTOMY     CHOLECYSTECTOMY     PARTIAL HYSTERECTOMY     SHOULDER SURGERY     TUMOR REMOVAL     Provided general information including the following: 1.  Date of education: 01/24/2022 2.  Physician name: Dr. Melvyn Neth 3.  Diagnosis: Hodgkin's Lymphoma 4.  Stage: stage IIIA 5.  Curative  6.  Chemotherapy plan including drugs and how often: Brentuximab, Doxorubicin, Dacarbazine, Vinblastine 7.  Start date: Pending authorization 8.  Other referrals: None at this time 9.  The patient is to call our office with any questions or concerns.  Our office number [phone], if after hours or on the weekend, call the same number and wait for the answering service.  There is always an oncologist on call 10.  Medications prescribed: 11.  The patient has verbalized understanding of the  treatment plan and has no barriers to adherence or understanding.  Obtained signed consent from patient.  Discussed symptoms including 1.  Low blood counts including red blood cells, white blood cells and platelets. 2. Infection including to avoid large crowds, wash hands frequently, and stay away from people who were sick.  If fever develops of 100.4 or higher, call our office. 3.  Mucositis-given instructions on mouth rinse (baking soda and salt mixture).  Keep mouth clean.  Use soft bristle toothbrush.  If mouth sores develop, call our clinic. 4.  Nausea/vomiting-gave prescriptions for ondansetron 4 mg every 4 hours as needed for nausea, may take around the clock if persistent.  Compazine 10 mg every 6 hours, may take around the clock if persistent. 5.  Diarrhea-use over-the-counter Imodium.  Call clinic if not controlled. 6.  Constipation-use senna, 1 to 2 tablets twice a day.  If no BM in 2 to 3 days call the clinic. 7.  Loss of appetite-try to eat small meals every 2-3 hours.  Call clinic if not eating. 8.  Taste changes-zinc 500 mg daily.  If becomes severe call clinic. 9.  Alcoholic beverages. 10.  Drink 2 to 3 quarts of water per day. 11.  Peripheral neuropathy-patient to call if numbness or tingling in hands or feet is persistent  Neulasta-will be given 24 to 48 hours after chemotherapy.  Gave information sheet on bone and joint pain.  Use Claritin or Pepcid.  May use ibuprofen or Aleve.  Call if symptoms persist or are unbearable.  Gave information on the supportive care team and how to contact them regarding services.  Discussed advanced directives.  The patient does not have their advanced directives but will look at the copy provided in their notebook and will call with any questions. Spiritual Nutrition Financial Social worker Advanced directives  Answered questions to patient satisfaction.  Patient is to call with any further questions or concerns.  Time spent on this  palliative care/chemotherapy education was 60 minutes with more than 50% spent discussing diagnosis, prognosis and symptom management.  The medication prescribed to the patient will be printed out from [email or web address] This will give the following information: Name of your medication Approved uses Dose and schedule Storage and handling Handling body fluids and waste Drug and food interactions Possible side effects and management Pregnancy, sexual activity, and contraception Obtaining medication   We discussed that social determinants of health may have significant impacts on health and outcomes for cancer patients.  Today we discussed specific social determinants of performance status, alcohol use, depression, financial needs, food insecurity, housing, interpersonal violence, social connections, stress, tobacco use, and transportation.    After lengthy discussion the following were identified as areas of need:   Outpatient services: We discussed options including home based and outpatient services, DME and care program. We discusssed that patients who participate in regular physical activity report fewer negative impacts of cancer and treatments and report less fatigue.   Financial Concerns: We discussed that living with cancer can create tremendous financial burden.  We discussed options for assistance. I asked that if assistance is needed in affording medications or paying bills to please let us know so that we can provide assistance. We discussed options for food including social services,and onsite food pantry.  We will also notify Mady Haagensen to see if cancer center can provide additional support.  Referral to Social work: Introduced Child psychotherapist Mady Haagensen and the services she can provide such as support with [employer],  cell phone and gas vouchers.   Support groups: We discussed options for support groups at the cancer center. If interested, please notify nurse navigator to  enroll. We discussed options for managing stress including healthy eating, exercise as well as participating in no charge counseling services at the cancer center and support groups.  If these are of interest, patient can notify either myself or primary nursing team.We discussed options for management including medications and referral to quit Smart program  Transportation: We discussed options for transportation.  I have notified primary oncology team who will help assist with arranging Zenaida Niece transportation for appointments when/if needed. We also discussed options for transportation on short notice/acute visits.  Palliative care services: We have palliative care services available in the cancer center to discuss goals of care and advanced care planning.  Please let us know if you have any questions or would like to speak to our palliative nurse practitioner.  Symptom Management Clinic: We discussed our symptom management clinic which is available for acute concerns while receiving treatment such as nausea, vomiting or diarrhea.  We can be reached via telephone at [phone] or through my chart.  We are available for virtual or in person visits on the same day from 830 to 4 PM Monday through Friday. She denies needing specific assistance at this time and She will be followed by Dr. Melvyn Neth clinical team.  Plan: Discussed symptom management clinic. Discussed palliative care services. Discussed resources that are available here at the cancer center. Discussed medications and new prescriptions to begin treatment such as anti-nausea or steroids.   Disposition: RTC on   Visit Diagnosis No diagnosis found.  Patient expressed understanding and was in agreement with this plan. She also understands that She can call clinic at any time with any questions, concerns, or complaints.   I provided 30 minutes of  face to face  during this encounter, and > 50% was spent counseling as documented under my  assessment & plan.   Ilda Basset, FNP- [facility]

## 2022-01-29 ENCOUNTER — Encounter: Payer: Self-pay | Admitting: Oncology

## 2022-01-30 DIAGNOSIS — E119 Type 2 diabetes mellitus without complications: Secondary | ICD-10-CM | POA: Diagnosis not present

## 2022-01-30 DIAGNOSIS — E1122 Type 2 diabetes mellitus with diabetic chronic kidney disease: Secondary | ICD-10-CM | POA: Diagnosis not present

## 2022-01-30 DIAGNOSIS — R590 Localized enlarged lymph nodes: Secondary | ICD-10-CM | POA: Diagnosis not present

## 2022-01-30 DIAGNOSIS — C8110 Nodular sclerosis classical Hodgkin lymphoma, unspecified site: Secondary | ICD-10-CM | POA: Diagnosis not present

## 2022-01-30 DIAGNOSIS — N183 Chronic kidney disease, stage 3 unspecified: Secondary | ICD-10-CM | POA: Diagnosis not present

## 2022-01-30 DIAGNOSIS — Z452 Encounter for adjustment and management of vascular access device: Secondary | ICD-10-CM | POA: Diagnosis not present

## 2022-01-30 DIAGNOSIS — C8590 Non-Hodgkin lymphoma, unspecified, unspecified site: Secondary | ICD-10-CM | POA: Diagnosis not present

## 2022-01-30 DIAGNOSIS — G894 Chronic pain syndrome: Secondary | ICD-10-CM | POA: Diagnosis not present

## 2022-01-30 DIAGNOSIS — J4540 Moderate persistent asthma, uncomplicated: Secondary | ICD-10-CM | POA: Diagnosis not present

## 2022-01-31 ENCOUNTER — Other Ambulatory Visit: Payer: Self-pay | Admitting: Pharmacist

## 2022-01-31 ENCOUNTER — Encounter: Payer: Self-pay | Admitting: Oncology

## 2022-01-31 ENCOUNTER — Other Ambulatory Visit: Payer: Self-pay

## 2022-01-31 ENCOUNTER — Inpatient Hospital Stay: Payer: Medicare HMO

## 2022-01-31 DIAGNOSIS — C8198 Hodgkin lymphoma, unspecified, lymph nodes of multiple sites: Secondary | ICD-10-CM | POA: Diagnosis not present

## 2022-01-31 DIAGNOSIS — D509 Iron deficiency anemia, unspecified: Secondary | ICD-10-CM

## 2022-01-31 DIAGNOSIS — C8118 Nodular sclerosis classical Hodgkin lymphoma, lymph nodes of multiple sites: Secondary | ICD-10-CM

## 2022-01-31 MED FILL — Dexamethasone Sodium Phosphate Inj 100 MG/10ML: INTRAMUSCULAR | Qty: 1 | Status: AC

## 2022-01-31 MED FILL — Brentuximab Vedotin For IV Soln 50 MG: INTRAVENOUS | Qty: 24 | Status: AC

## 2022-01-31 NOTE — Progress Notes (Signed)
..  Pharmacist Chemotherapy Monitoring - Initial Assessment   ? ?Anticipated start date: 02/01/22 ? ?The following has been reviewed per standard work regarding the patient's treatment regimen: ?The patient's diagnosis, treatment plan and drug doses, and organ/hematologic function ?Lab orders and baseline tests specific to treatment regimen  ?The treatment plan start date, drug sequencing, and pre-medications ?Prior authorization status  ?Patient's documented medication list, including drug-drug interaction screen and prescriptions for anti-emetics and supportive care specific to the treatment regimen ?The drug concentrations, fluid compatibility, administration routes, and timing of the medications to be used ?The patient's access for treatment and lifetime cumulative dose history, if applicable  ?The patient's medication allergies and previous infusion related reactions, if applicable  ? ?Changes made to treatment plan:  ?N/A ? ?Follow up needed:  ?N/A ? ? ?Juanetta Beets, [facility], ?01/31/2022  2:54 PM  ?

## 2022-02-01 ENCOUNTER — Encounter: Payer: Self-pay | Admitting: Oncology

## 2022-02-01 ENCOUNTER — Inpatient Hospital Stay: Payer: Medicare HMO

## 2022-02-01 VITALS — BP 149/74 | HR 98 | Temp 98.2°F | Resp 18 | Ht 62.5 in | Wt 145.0 lb

## 2022-02-01 DIAGNOSIS — C8118 Nodular sclerosis classical Hodgkin lymphoma, lymph nodes of multiple sites: Secondary | ICD-10-CM

## 2022-02-01 DIAGNOSIS — Z5112 Encounter for antineoplastic immunotherapy: Secondary | ICD-10-CM | POA: Diagnosis not present

## 2022-02-01 DIAGNOSIS — D509 Iron deficiency anemia, unspecified: Secondary | ICD-10-CM

## 2022-02-01 DIAGNOSIS — C8190 Hodgkin lymphoma, unspecified, unspecified site: Secondary | ICD-10-CM | POA: Diagnosis not present

## 2022-02-01 DIAGNOSIS — Z5189 Encounter for other specified aftercare: Secondary | ICD-10-CM | POA: Diagnosis not present

## 2022-02-01 DIAGNOSIS — Z79899 Other long term (current) drug therapy: Secondary | ICD-10-CM | POA: Diagnosis not present

## 2022-02-01 MED ORDER — SODIUM CHLORIDE 0.9% FLUSH
10.0000 mL | INTRAVENOUS | Status: DC | PRN
  Administered 2022-02-01: 10 mL

## 2022-02-01 MED ORDER — SODIUM CHLORIDE 0.9 % IV SOLN
1.8000 mg/kg | Freq: Once | INTRAVENOUS | Status: AC
  Administered 2022-02-01: 120 mg via INTRAVENOUS
  Filled 2022-02-01: qty 24

## 2022-02-01 MED ORDER — SODIUM CHLORIDE 0.9 % IV SOLN
10.0000 mg | Freq: Once | INTRAVENOUS | Status: AC
  Administered 2022-02-01: 10 mg via INTRAVENOUS
  Filled 2022-02-01: qty 10

## 2022-02-01 MED ORDER — SODIUM CHLORIDE 0.9 % IV SOLN: Freq: Once | INTRAVENOUS | Status: AC

## 2022-02-01 MED ORDER — SODIUM CHLORIDE 0.9 % IV SOLN
200.0000 mg | Freq: Once | INTRAVENOUS | Status: AC
  Administered 2022-02-01: 200 mg via INTRAVENOUS
  Filled 2022-02-01: qty 200

## 2022-02-01 MED ORDER — DIPHENHYDRAMINE HCL 50 MG/ML IJ SOLN
50.0000 mg | Freq: Once | INTRAMUSCULAR | Status: AC
  Administered 2022-02-01: 50 mg via INTRAVENOUS
  Filled 2022-02-01: qty 1

## 2022-02-01 MED ORDER — HEPARIN SOD (PORK) LOCK FLUSH 100 UNIT/ML IV SOLN
500.0000 [IU] | Freq: Once | INTRAVENOUS | Status: AC | PRN
  Administered 2022-02-01: 500 [IU]

## 2022-02-01 MED ORDER — ACETAMINOPHEN 325 MG PO TABS
650.0000 mg | ORAL_TABLET | Freq: Once | ORAL | Status: AC
  Administered 2022-02-01: 650 mg via ORAL
  Filled 2022-02-01: qty 2

## 2022-02-01 NOTE — Patient Instructions (Signed)
Brentuximab vedotin solution for injection ?What is this medication? ?BRENTUXIMAB VEDOTIN (bren TUX see mab ve DOE tin) is a monoclonal antibody and a chemotherapy drug. It is used for treating Hodgkin lymphoma and certain non-Hodgkin lymphomas, such as anaplastic large-cell lymphoma, mycosis fungoides, and peripheral T-cell lymphoma. ?This medicine may be used for other purposes; ask your health care provider or pharmacist if you have questions. ?COMMON BRAND NAME(S): ADCETRIS ?What should I tell my care team before I take this medication? ?They need to know if you have any of these conditions: ?immune system problems ?infection (especially a virus infection such as chickenpox, cold sores, or herpes) ?kidney disease ?liver disease ?low blood counts, like low white cell, platelet, or red cell counts ?tingling of the fingers or toes, or other nerve disorder ?an unusual or allergic reaction to brentuximab vedotin, other medicines, foods, dyes, or preservatives ?pregnant or trying to get pregnant ?breast-feeding ?How should I use this medication? ?This medicine is for infusion into a vein. It is given by a health care professional in a hospital or clinic setting. ?Talk to your pediatrician regarding the use of this medicine in children. Special care may be needed. ?Overdosage: If you think you have taken too much of this medicine contact a poison control center or emergency room at once. ?NOTE: This medicine is only for you. Do not share this medicine with others. ?What if I miss a dose? ?It is important not to miss your dose. Call your doctor or health care professional if you are unable to keep an appointment. ?What may interact with this medication? ?Do not take this medicine with any of the following medications: ?bleomycin ?This medicine may also interact with the following medications: ?ketoconazole ?rifampin ?St. John's wort; Hypericum perforatum ?This list may not describe all possible interactions. Give your  health care provider a list of all the medicines, herbs, non-prescription drugs, or dietary supplements you use. Also tell them if you smoke, drink alcohol, or use illegal drugs. Some items may interact with your medicine. ?What should I watch for while using this medication? ?Visit your doctor for checks on your progress. This drug may make you feel generally unwell. Report any side effects. Continue your course of treatment even though you feel ill unless your doctor tells you to stop. ?Call your doctor or health care professional for advice if you get a fever, chills or sore throat, or other symptoms of a cold or flu. Do not treat yourself. This drug decreases your body's ability to fight infections. Try to avoid being around people who are sick. ?This medicine may increase your risk to bruise or bleed. Call your doctor or health care professional if you notice any unusual bleeding. ?In some patients, this medicine may cause a serious brain infection that may cause death. If you have any problems seeing, thinking, speaking, walking, or standing, tell your doctor right away. If you cannot reach your doctor, urgently seek other source of medical care. ?Do not become pregnant while taking this medicine or for 6 months after stopping it. Women should inform their doctor if they wish to become pregnant or think they might be pregnant. Men should not father a child while taking this medicine and for 6 months after stopping it. There is a potential for serious side effects to an unborn child. Talk to your health care professional or pharmacist for more information. Do not breast-feed an infant while taking this medicine. ?This may interfere with the ability to father a child. You  should talk to your doctor or health care professional if you are concerned about your fertility. ?What side effects may I notice from receiving this medication? ?Side effects that you should report to your doctor or health care professional as  soon as possible: ?allergic reactions like skin rash, itching or hives, swelling of the face, lips, or tongue ?changes in emotions or moods ?diarrhea ?low blood counts - this medicine may decrease the number of white blood cells, red blood cells and platelets. You may be at increased risk for infections and bleeding. ?pain, tingling, numbness in the hands or feet ?redness, blistering, peeling or loosening of the skin, including inside the mouth ?shortness of breath ?signs of infection - fever or chills, cough, sore throat, pain or difficulty passing urine ?signs of decreased platelets or bleeding - bruising, pinpoint red spots on the skin, black, tarry stools, blood in the urine ?signs of decreased red blood cells - unusually weak or tired, fainting spells, lightheadedness ?signs of liver injury like dark yellow or brown urine; general ill feeling or flu-like symptoms; light-colored stools; loss of appetite; nausea; right upper belly pain; yellowing of the eyes or skin ?stomach pain ?sudden numbness or weakness of the face, arm or leg ?vomiting ?Side effects that usually do not require medical attention (report to your doctor or health care professional if they continue or are bothersome): ?constipation ?dizziness ?headache ?muscle pain ?tiredness ?This list may not describe all possible side effects. Call your doctor for medical advice about side effects. You may report side effects to FDA at 1-800-FDA-1088. ?Where should I keep my medication? ?This drug is given in a hospital or clinic and will not be stored at home. ?NOTE: This sheet is a summary. It may not cover all possible information. If you have questions about this medicine, talk to your doctor, pharmacist, or health care provider. ?? 2022 Elsevier/Gold Standard (2021-08-01 00:00:00) ? ?

## 2022-02-02 ENCOUNTER — Inpatient Hospital Stay: Payer: Medicare HMO

## 2022-02-02 ENCOUNTER — Telehealth: Payer: Self-pay

## 2022-02-02 VITALS — BP 138/74 | HR 90 | Temp 98.3°F | Resp 18 | Ht 62.5 in | Wt 146.0 lb

## 2022-02-02 DIAGNOSIS — Z5189 Encounter for other specified aftercare: Secondary | ICD-10-CM | POA: Diagnosis not present

## 2022-02-02 DIAGNOSIS — D509 Iron deficiency anemia, unspecified: Secondary | ICD-10-CM

## 2022-02-02 DIAGNOSIS — C8190 Hodgkin lymphoma, unspecified, unspecified site: Secondary | ICD-10-CM | POA: Diagnosis not present

## 2022-02-02 DIAGNOSIS — Z5112 Encounter for antineoplastic immunotherapy: Secondary | ICD-10-CM | POA: Diagnosis not present

## 2022-02-02 DIAGNOSIS — Z79899 Other long term (current) drug therapy: Secondary | ICD-10-CM | POA: Diagnosis not present

## 2022-02-02 MED ORDER — SODIUM CHLORIDE 0.9 % IV SOLN: Freq: Once | INTRAVENOUS | Status: AC

## 2022-02-02 MED ORDER — SODIUM CHLORIDE 0.9 % IV SOLN
200.0000 mg | Freq: Once | INTRAVENOUS | Status: AC
  Administered 2022-02-02: 200 mg via INTRAVENOUS
  Filled 2022-02-02: qty 200

## 2022-02-02 MED FILL — Iron Sucrose Inj 20 MG/ML (Fe Equiv): INTRAVENOUS | Qty: 10 | Status: AC

## 2022-02-02 NOTE — Telephone Encounter (Signed)
I spoke with pt to see how she tolerated her first infusion and how she did through the night. Pt states, "I did ok. I had a little nausea but that's it. If it would stay like this, it would be good". Pt denies emesis, fever, rash, and diarrhea. I reminded pt and her granddaughter,Amber, to call us if she develops temp of 100.4, day or night. Both verbalized understanding. We also reviewed the correct dosing of decadron for today - Sunday.  ?

## 2022-02-02 NOTE — Patient Instructions (Signed)

## 2022-02-05 ENCOUNTER — Inpatient Hospital Stay: Payer: Medicare HMO

## 2022-02-05 ENCOUNTER — Other Ambulatory Visit: Payer: Self-pay

## 2022-02-05 VITALS — BP 161/74 | HR 88 | Temp 98.1°F | Resp 18 | Ht 62.5 in | Wt 145.0 lb

## 2022-02-05 DIAGNOSIS — Z5112 Encounter for antineoplastic immunotherapy: Secondary | ICD-10-CM | POA: Diagnosis not present

## 2022-02-05 DIAGNOSIS — D509 Iron deficiency anemia, unspecified: Secondary | ICD-10-CM

## 2022-02-05 DIAGNOSIS — C8190 Hodgkin lymphoma, unspecified, unspecified site: Secondary | ICD-10-CM | POA: Diagnosis not present

## 2022-02-05 DIAGNOSIS — Z5189 Encounter for other specified aftercare: Secondary | ICD-10-CM | POA: Diagnosis not present

## 2022-02-05 DIAGNOSIS — Z79899 Other long term (current) drug therapy: Secondary | ICD-10-CM | POA: Diagnosis not present

## 2022-02-05 MED ORDER — SODIUM CHLORIDE 0.9 % IV SOLN
200.0000 mg | Freq: Once | INTRAVENOUS | Status: AC
  Administered 2022-02-05: 200 mg via INTRAVENOUS
  Filled 2022-02-05: qty 200

## 2022-02-05 MED ORDER — SODIUM CHLORIDE 0.9 % IV SOLN: Freq: Once | INTRAVENOUS | Status: AC

## 2022-02-05 NOTE — Patient Instructions (Signed)

## 2022-02-06 ENCOUNTER — Encounter: Payer: Self-pay | Admitting: Oncology

## 2022-02-06 ENCOUNTER — Telehealth: Payer: Self-pay

## 2022-02-06 NOTE — Telephone Encounter (Signed)
-----   Message from Belva Chimes, LPN sent at 3/57/0177 10:47 AM EDT ----- ?Regarding: FW: Patient having issues ? ?----- Message ----- ?From: Melodye Ped, NP ?Sent: 02/06/2022  10:01 AM EDT ?To: Belva Chimes, LPN ?Subject: RE: Patient having issues                     ? ?I spoke with patient and advised to use her ativan for nausea and to take 2 protonix. I will send in diflucan for yeast infection. She will be evaluated in infusion tomorrow.  ?----- Message ----- ?From: Belva Chimes, LPN ?Sent: 02/06/2022   9:06 AM EDT ?To: Melodye Ped, NP ?Subject: Patient having issues                         ? ?Patient called stating the her stomach hurts and burns, staying nauseated, can't eat, trouble sleeping, and possibly has a yeast infection cause it burns and feels irritated when urinating. Patient would like for Melissa to call her back at [phone].  ? ? ? ?

## 2022-02-07 ENCOUNTER — Inpatient Hospital Stay: Payer: Medicare HMO

## 2022-02-07 ENCOUNTER — Other Ambulatory Visit: Payer: Self-pay

## 2022-02-07 VITALS — BP 159/74 | HR 92 | Temp 97.9°F | Resp 18 | Ht 62.5 in | Wt 141.0 lb

## 2022-02-07 DIAGNOSIS — Z79899 Other long term (current) drug therapy: Secondary | ICD-10-CM | POA: Diagnosis not present

## 2022-02-07 DIAGNOSIS — Z5112 Encounter for antineoplastic immunotherapy: Secondary | ICD-10-CM | POA: Diagnosis not present

## 2022-02-07 DIAGNOSIS — D509 Iron deficiency anemia, unspecified: Secondary | ICD-10-CM

## 2022-02-07 DIAGNOSIS — Z5189 Encounter for other specified aftercare: Secondary | ICD-10-CM | POA: Diagnosis not present

## 2022-02-07 DIAGNOSIS — C8190 Hodgkin lymphoma, unspecified, unspecified site: Secondary | ICD-10-CM | POA: Diagnosis not present

## 2022-02-07 MED ORDER — SODIUM CHLORIDE 0.9 % IV SOLN: Freq: Once | INTRAVENOUS | Status: AC

## 2022-02-07 MED ORDER — SODIUM CHLORIDE 0.9 % IV SOLN
200.0000 mg | Freq: Once | INTRAVENOUS | Status: AC
  Administered 2022-02-07: 200 mg via INTRAVENOUS
  Filled 2022-02-07: qty 200

## 2022-02-07 NOTE — Patient Instructions (Signed)

## 2022-02-08 ENCOUNTER — Inpatient Hospital Stay: Payer: Medicare HMO

## 2022-02-08 VITALS — BP 151/67 | HR 106 | Temp 97.8°F | Resp 18 | Ht 62.5 in | Wt 142.0 lb

## 2022-02-08 DIAGNOSIS — C8190 Hodgkin lymphoma, unspecified, unspecified site: Secondary | ICD-10-CM | POA: Diagnosis not present

## 2022-02-08 DIAGNOSIS — Z5112 Encounter for antineoplastic immunotherapy: Secondary | ICD-10-CM | POA: Diagnosis not present

## 2022-02-08 DIAGNOSIS — Z79899 Other long term (current) drug therapy: Secondary | ICD-10-CM | POA: Diagnosis not present

## 2022-02-08 DIAGNOSIS — Z5189 Encounter for other specified aftercare: Secondary | ICD-10-CM | POA: Diagnosis not present

## 2022-02-08 DIAGNOSIS — C8110 Nodular sclerosis classical Hodgkin lymphoma, unspecified site: Secondary | ICD-10-CM | POA: Diagnosis not present

## 2022-02-08 LAB — CBC AND DIFFERENTIAL
HCT: 27 — AB (ref 36–46)
Hemoglobin: 8.5 — AB (ref 12.0–16.0)
Neutrophils Absolute: 3.48
Platelets: 175 10*3/uL (ref 150–400)
WBC: 4.7

## 2022-02-08 LAB — CBC: RBC: 2.98 — AB (ref 3.87–5.11)

## 2022-02-08 MED ORDER — SODIUM CHLORIDE 0.9 % IV SOLN: Freq: Once | INTRAVENOUS | Status: AC

## 2022-02-08 MED ORDER — SODIUM CHLORIDE 0.9 % IV SOLN
200.0000 mg | Freq: Once | INTRAVENOUS | Status: AC
  Administered 2022-02-08: 200 mg via INTRAVENOUS
  Filled 2022-02-08: qty 200

## 2022-02-08 NOTE — Patient Instructions (Signed)

## 2022-02-13 DIAGNOSIS — Z0000 Encounter for general adult medical examination without abnormal findings: Secondary | ICD-10-CM | POA: Diagnosis not present

## 2022-02-13 DIAGNOSIS — K219 Gastro-esophageal reflux disease without esophagitis: Secondary | ICD-10-CM | POA: Diagnosis not present

## 2022-02-13 DIAGNOSIS — Z79899 Other long term (current) drug therapy: Secondary | ICD-10-CM | POA: Diagnosis not present

## 2022-02-13 DIAGNOSIS — C8114 Nodular sclerosis classical Hodgkin lymphoma, lymph nodes of axilla and upper limb: Secondary | ICD-10-CM | POA: Diagnosis not present

## 2022-02-13 DIAGNOSIS — R69 Illness, unspecified: Secondary | ICD-10-CM | POA: Diagnosis not present

## 2022-02-13 DIAGNOSIS — Z6824 Body mass index (BMI) 24.0-24.9, adult: Secondary | ICD-10-CM | POA: Diagnosis not present

## 2022-02-13 DIAGNOSIS — E785 Hyperlipidemia, unspecified: Secondary | ICD-10-CM | POA: Diagnosis not present

## 2022-02-13 DIAGNOSIS — R809 Proteinuria, unspecified: Secondary | ICD-10-CM | POA: Diagnosis not present

## 2022-02-13 DIAGNOSIS — E1129 Type 2 diabetes mellitus with other diabetic kidney complication: Secondary | ICD-10-CM | POA: Diagnosis not present

## 2022-02-13 DIAGNOSIS — M0579 Rheumatoid arthritis with rheumatoid factor of multiple sites without organ or systems involvement: Secondary | ICD-10-CM | POA: Diagnosis not present

## 2022-02-19 ENCOUNTER — Other Ambulatory Visit: Payer: Self-pay | Admitting: Oncology

## 2022-02-19 DIAGNOSIS — C8178 Other classical Hodgkin lymphoma, lymph nodes of multiple sites: Secondary | ICD-10-CM

## 2022-02-19 NOTE — Progress Notes (Signed)
?[facility]  ?[address] ?[place],  [place]  [address] ?[phone] ? ?Clinic Day:  02/20/2022 ? ?Referring physician: Serita Grammes, MD ? ?HISTORY OF PRESENT ILLNESS:  ?The patient is a 76 y.o. female with stage IIIA classical Hodgkin lymphoma.  She comes in today to be evaluated before heading into her 2nd and final cycle of brentuximab vedotin.  The patient claims to have tolerated her first cycle fairly well.  However, she did have problems with Decadron, which she took for 3 straight days after her brentuximab dose was given.  As it pertains to her disease, she denies having any B symptoms which concern her for overt signs of disease progression while on treatment. ? ?PHYSICAL EXAM:  ?Blood pressure (!) 224/96, pulse (!) 103, temperature 98.6 ?F (37 ?C), resp. rate 14, height 5' 2.5" (1.588 m), weight 144 lb 12.8 oz (65.7 kg), SpO2 98 %. ?Wt Readings from Last 3 Encounters:  ?02/20/22 144 lb 12.8 oz (65.7 kg)  ?02/08/22 142 lb (64.4 kg)  ?02/07/22 141 lb (64 kg)  ? ?Body mass index is 26.06 kg/m?Marland Kitchen ?Performance status (ECOG): 1 - Symptomatic but completely ambulatory ?Physical Exam ?Constitutional:   ?   Appearance: Normal appearance.  ?HENT:  ?   Mouth/Throat:  ?   Pharynx: Oropharynx is clear. No oropharyngeal exudate.  ?Cardiovascular:  ?   Rate and Rhythm: Normal rate and regular rhythm.  ?   Heart sounds: No murmur heard. ?  No friction rub. No gallop.  ?Pulmonary:  ?   Breath sounds: Normal breath sounds.  ?Chest:  ?Breasts: ?   Right: No swelling, bleeding, inverted nipple, mass, nipple discharge or skin change.  ?   Left: No swelling, bleeding, inverted nipple, mass, nipple discharge or skin change.  ?Abdominal:  ?   General: Bowel sounds are normal. There is no distension.  ?   Palpations: Abdomen is soft. There is no mass.  ?   Tenderness: There is no abdominal tenderness.  ?Musculoskeletal:     ?   General: No tenderness.  ?   Cervical back: Normal range of  motion and neck supple.  ?   Right lower leg: No edema.  ?   Left lower leg: No edema.  ?Lymphadenopathy:  ?   Cervical: No cervical adenopathy.  ?   Right cervical: No superficial, deep or posterior cervical adenopathy. ?   Left cervical: No superficial, deep or posterior cervical adenopathy.  ?   Upper Body:  ?   Right upper body: No supraclavicular or axillary adenopathy.  ?   Left upper body: No supraclavicular or axillary adenopathy.  ?   Lower Body: No right inguinal adenopathy. No left inguinal adenopathy.  ?Skin: ?   Coloration: Skin is not jaundiced.  ?   Findings: No lesion or rash.  ?Neurological:  ?   General: No focal deficit present.  ?   Mental Status: She is alert and oriented to person, place, and time. Mental status is at baseline.  ?Psychiatric:     ?   Mood and Affect: Mood normal.     ?   Behavior: Behavior normal.     ?   Thought Content: Thought content normal.     ?   Judgment: Judgment normal.  ? ?LABS: ? Latest Reference Range & Units 02/20/22 00:00  ?Sodium 137 - 147  140 (E)  ?Potassium 3.5 - 5.1 mEq/L 3.7 (E)  ?Chloride 99 - 108  106 (E)  ?CO2  13 - 22  28 ! (E)  ?Glucose  128 (E)  ?Calcium 8.7 - 10.7  8.8 (E)  ?Alkaline Phosphatase 25 - 125  101 (E)  ?Albumin 3.5 - 5.0  3.8 (E)  ?AST 13 - 35  28 (E)  ?ALT 7 - 35 U/L 21 (E)  ?Bilirubin, Total  0.6 (E)  ?WBC  1.4 (E)  ?RBC 3.87 - 5.11  3.47 ! (E)  ?Hemoglobin 12.0 - 16.0  10.3 ! (E)  ?HCT 36 - 46  32 ! (E)  ?Platelets 150 - 400 K/uL 147 ! (E)  ?NEUT#  0.15 (E)  ?!: Data is abnormal ?(E): External lab result  ? ?ASSESSMENT & PLAN:  ?Assessment/Plan:  A 77 y.o. female with stage IIIA classical Hodgkin lymphoma, nodular sclerosis subtype.  The goal is for her to proceed with her second cycle of brentuximab vedotin this week.  However, she is very neutropenic.  I will give her Zarxio over these next few days to bolster her white count in hopes it will be good enough for her to proceed with her 2nd cycle of brentuximab later this week.  If not,  her 2nd cycle may need to be delayed for 1 week.  The patient already knows she is scheduled for additional cycles of chemotherapy after her 2 cycles of brentuximab vedotin are completed, which will consist of 6 cycles of Adriamycin/vinblastine/dacarbazine.  On another note, the patient has had progressive dysuria.  Based upon this, her urine will be sent for urinalysis and urine culture.  Our office will inform her of the results when they become available.  Overall, the patient appears to be doing well.  I will see her back in 3 weeks before she heads into her first cycle of AVD chemotherapy.  The patient understands all the plans discussed today and knows to contact our office before her next visit if she runs into any problems that require immediate clinical attention.   ? ?Jene Oravec Macarthur Critchley, MD ? ? ? ?  ?

## 2022-02-20 ENCOUNTER — Telehealth: Payer: Self-pay | Admitting: Oncology

## 2022-02-20 ENCOUNTER — Other Ambulatory Visit: Payer: Self-pay

## 2022-02-20 ENCOUNTER — Inpatient Hospital Stay: Payer: Medicare HMO | Admitting: Oncology

## 2022-02-20 ENCOUNTER — Other Ambulatory Visit: Payer: Self-pay | Admitting: Oncology

## 2022-02-20 ENCOUNTER — Inpatient Hospital Stay: Payer: Medicare HMO

## 2022-02-20 VITALS — BP 224/96 | HR 103 | Temp 98.6°F | Resp 14 | Ht 62.5 in | Wt 144.8 lb

## 2022-02-20 DIAGNOSIS — C8118 Nodular sclerosis classical Hodgkin lymphoma, lymph nodes of multiple sites: Secondary | ICD-10-CM

## 2022-02-20 DIAGNOSIS — C8178 Other classical Hodgkin lymphoma, lymph nodes of multiple sites: Secondary | ICD-10-CM

## 2022-02-20 DIAGNOSIS — Z5112 Encounter for antineoplastic immunotherapy: Secondary | ICD-10-CM | POA: Diagnosis not present

## 2022-02-20 DIAGNOSIS — C8190 Hodgkin lymphoma, unspecified, unspecified site: Secondary | ICD-10-CM | POA: Diagnosis not present

## 2022-02-20 DIAGNOSIS — D702 Other drug-induced agranulocytosis: Secondary | ICD-10-CM

## 2022-02-20 DIAGNOSIS — Z5189 Encounter for other specified aftercare: Secondary | ICD-10-CM | POA: Diagnosis not present

## 2022-02-20 DIAGNOSIS — Z79899 Other long term (current) drug therapy: Secondary | ICD-10-CM | POA: Diagnosis not present

## 2022-02-20 DIAGNOSIS — C8198 Hodgkin lymphoma, unspecified, lymph nodes of multiple sites: Secondary | ICD-10-CM | POA: Diagnosis not present

## 2022-02-20 DIAGNOSIS — R300 Dysuria: Secondary | ICD-10-CM

## 2022-02-20 LAB — BASIC METABOLIC PANEL
CO2: 28 — AB (ref 13–22)
Chloride: 106 (ref 99–108)
Glucose: 128
Potassium: 3.7 mEq/L (ref 3.5–5.1)
Sodium: 140 (ref 137–147)

## 2022-02-20 LAB — HEPATIC FUNCTION PANEL
ALT: 21 U/L (ref 7–35)
AST: 28 (ref 13–35)
Alkaline Phosphatase: 101 (ref 25–125)
Bilirubin, Total: 0.6

## 2022-02-20 LAB — CBC AND DIFFERENTIAL
HCT: 32 — AB (ref 36–46)
Hemoglobin: 10.3 — AB (ref 12.0–16.0)
Neutrophils Absolute: 0.15
Platelets: 147 10*3/uL — AB (ref 150–400)
WBC: 1.4

## 2022-02-20 LAB — COMPREHENSIVE METABOLIC PANEL
Albumin: 3.8 (ref 3.5–5.0)
Calcium: 8.8 (ref 8.7–10.7)

## 2022-02-20 LAB — SEDIMENTATION RATE: Sed Rate: 23 mm/hr — ABNORMAL HIGH (ref 0–22)

## 2022-02-20 LAB — CBC: RBC: 3.47 — AB (ref 3.87–5.11)

## 2022-02-20 NOTE — Telephone Encounter (Signed)
Per 02/20/22 los next appt scheduled and confirmed with patient ?

## 2022-02-21 ENCOUNTER — Encounter: Payer: Self-pay | Admitting: Oncology

## 2022-02-21 ENCOUNTER — Other Ambulatory Visit: Payer: Self-pay | Admitting: Pharmacist

## 2022-02-21 DIAGNOSIS — D702 Other drug-induced agranulocytosis: Secondary | ICD-10-CM | POA: Insufficient documentation

## 2022-02-21 LAB — URINALYSIS, COMPLETE (UACMP) WITH MICROSCOPIC
Bilirubin Urine: NEGATIVE
Glucose, UA: NEGATIVE mg/dL
Hgb urine dipstick: NEGATIVE
Ketones, ur: NEGATIVE mg/dL
Leukocytes,Ua: NEGATIVE
Nitrite: POSITIVE — AB
Protein, ur: 30 mg/dL — AB
Specific Gravity, Urine: 1.018 (ref 1.005–1.030)
pH: 5 (ref 5.0–8.0)

## 2022-02-22 ENCOUNTER — Telehealth: Payer: Self-pay | Admitting: Oncology

## 2022-02-22 ENCOUNTER — Inpatient Hospital Stay: Payer: Medicare HMO

## 2022-02-22 ENCOUNTER — Ambulatory Visit: Payer: Medicare HMO

## 2022-02-22 ENCOUNTER — Encounter: Payer: Self-pay | Admitting: Oncology

## 2022-02-22 ENCOUNTER — Other Ambulatory Visit: Payer: Self-pay | Admitting: Pharmacist

## 2022-02-22 VITALS — BP 154/64 | HR 94 | Temp 98.0°F | Resp 18 | Ht 62.5 in | Wt 144.0 lb

## 2022-02-22 DIAGNOSIS — D702 Other drug-induced agranulocytosis: Secondary | ICD-10-CM

## 2022-02-22 DIAGNOSIS — Z5112 Encounter for antineoplastic immunotherapy: Secondary | ICD-10-CM | POA: Diagnosis not present

## 2022-02-22 DIAGNOSIS — Z79899 Other long term (current) drug therapy: Secondary | ICD-10-CM | POA: Diagnosis not present

## 2022-02-22 DIAGNOSIS — C8190 Hodgkin lymphoma, unspecified, unspecified site: Secondary | ICD-10-CM | POA: Diagnosis not present

## 2022-02-22 DIAGNOSIS — Z5189 Encounter for other specified aftercare: Secondary | ICD-10-CM | POA: Diagnosis not present

## 2022-02-22 MED ORDER — FILGRASTIM-SNDZ 480 MCG/0.8ML IJ SOSY
480.0000 ug | PREFILLED_SYRINGE | Freq: Once | INTRAMUSCULAR | Status: AC
  Administered 2022-02-22: 480 ug via SUBCUTANEOUS
  Filled 2022-02-22: qty 0.8

## 2022-02-22 NOTE — Patient Instructions (Signed)
Filgrastim, G-CSF injection °What is this medication? °FILGRASTIM, G-CSF (fil GRA stim) is a granulocyte colony-stimulating factor that stimulates the growth of neutrophils, a type of white blood cell (WBC) important in the body's fight against infection. It is used to reduce the incidence of fever and infection in patients with certain types of cancer who are receiving chemotherapy that affects the bone marrow, to stimulate blood cell production for removal of WBCs from the body prior to a bone marrow transplantation, to reduce the incidence of fever and infection in patients who have severe chronic neutropenia, and to improve survival outcomes following high-dose radiation exposure that is toxic to the bone marrow. °This medicine may be used for other purposes; ask your health care provider or pharmacist if you have questions. °COMMON BRAND NAME(S): Neupogen, Nivestym, Releuko, Zarxio °What should I tell my care team before I take this medication? °They need to know if you have any of these conditions: °kidney disease °latex allergy °ongoing radiation therapy °sickle cell disease °an unusual or allergic reaction to filgrastim, pegfilgrastim, other medicines, foods, dyes, or preservatives °pregnant or trying to get pregnant °breast-feeding °How should I use this medication? °This medicine is for injection under the skin or infusion into a vein. As an infusion into a vein, it is usually given by a health care professional in a hospital or clinic setting. If you get this medicine at home, you will be taught how to prepare and give this medicine. Refer to the Instructions for Use that come with your medication packaging. Use exactly as directed. Take your medicine at regular intervals. Do not take your medicine more often than directed. °It is important that you put your used needles and syringes in a special sharps container. Do not put them in a trash can. If you do not have a sharps container, call your pharmacist  or healthcare provider to get one. °Talk to your pediatrician regarding the use of this medicine in children. While this drug may be prescribed for children as young as 7 months for selected conditions, precautions do apply. °Overdosage: If you think you have taken too much of this medicine contact a poison control center or emergency room at once. °NOTE: This medicine is only for you. Do not share this medicine with others. °What if I miss a dose? °It is important not to miss your dose. Call your doctor or health care professional if you miss a dose. °What may interact with this medication? °This medicine may interact with the following medications: °medicines that may cause a release of neutrophils, such as lithium °This list may not describe all possible interactions. Give your health care provider a list of all the medicines, herbs, non-prescription drugs, or dietary supplements you use. Also tell them if you smoke, drink alcohol, or use illegal drugs. Some items may interact with your medicine. °What should I watch for while using this medication? °Your condition will be monitored carefully while you are receiving this medicine. °You may need blood work done while you are taking this medicine. °Talk to your health care provider about your risk of cancer. You may be more at risk for certain types of cancer if you take this medicine. °What side effects may I notice from receiving this medication? °Side effects that you should report to your doctor or health care professional as soon as possible: °allergic reactions like skin rash, itching or hives, swelling of the face, lips, or tongue °back pain °dizziness or feeling faint °fever °pain, redness, or   irritation at site where injected °pinpoint red spots on the skin °shortness of breath or breathing problems °signs and symptoms of kidney injury like trouble passing urine, change in the amount of urine, or red or dark-brown urine °stomach or side pain, or pain at  the shoulder °swelling °tiredness °unusual bleeding or bruising °Side effects that usually do not require medical attention (report to your doctor or health care professional if they continue or are bothersome): °bone pain °cough °diarrhea °hair loss °headache °muscle pain °This list may not describe all possible side effects. Call your doctor for medical advice about side effects. You may report side effects to FDA at 1-800-FDA-1088. °Where should I keep my medication? °Keep out of the reach of children. °Store in a refrigerator between 2 and 8 degrees C (36 and 46 degrees F). Do not freeze. Keep in carton to protect from light. Throw away this medicine if vials or syringes are left out of the refrigerator for more than 24 hours. Throw away any unused medicine after the expiration date. °NOTE: This sheet is a summary. It may not cover all possible information. If you have questions about this medicine, talk to your doctor, pharmacist, or health care provider. °© 2022 Elsevier/Gold Standard (2021-08-01 00:00:00) ° °

## 2022-02-22 NOTE — Telephone Encounter (Signed)
Patient has been scheduled. She is aware of appt times. ? ?Scheduling Message ?Entered by Juanetta Beets on 02/22/2022 at  8:57 AM ?Priority: High ?<No visit type provided>  ?Department: [facility]  ?Provider:   ?Appointment Notes:  ?Insurance has approved the zarxio injection to help improve her counts.  Please contact the patient and schedule her for zarxio today and tomorrow.  Dwyane Luo said that 1:30 today would be a good time today and tomorrow.  ?Scheduling Notes:  ? ?

## 2022-02-23 ENCOUNTER — Telehealth: Payer: Self-pay

## 2022-02-23 ENCOUNTER — Encounter: Payer: Self-pay | Admitting: Oncology

## 2022-02-23 ENCOUNTER — Other Ambulatory Visit: Payer: Self-pay | Admitting: Hematology and Oncology

## 2022-02-23 ENCOUNTER — Inpatient Hospital Stay: Payer: Medicare HMO

## 2022-02-23 VITALS — BP 142/76 | HR 94 | Temp 98.2°F | Resp 18 | Ht 62.5 in | Wt 143.0 lb

## 2022-02-23 DIAGNOSIS — C8190 Hodgkin lymphoma, unspecified, unspecified site: Secondary | ICD-10-CM | POA: Diagnosis not present

## 2022-02-23 DIAGNOSIS — Z79899 Other long term (current) drug therapy: Secondary | ICD-10-CM | POA: Diagnosis not present

## 2022-02-23 DIAGNOSIS — Z5112 Encounter for antineoplastic immunotherapy: Secondary | ICD-10-CM | POA: Diagnosis not present

## 2022-02-23 DIAGNOSIS — D702 Other drug-induced agranulocytosis: Secondary | ICD-10-CM

## 2022-02-23 DIAGNOSIS — Z5189 Encounter for other specified aftercare: Secondary | ICD-10-CM | POA: Diagnosis not present

## 2022-02-23 LAB — URINE CULTURE: Culture: >=100000 — AB

## 2022-02-23 MED ORDER — NITROFURANTOIN MONOHYD MACRO 100 MG PO CAPS
100.0000 mg | ORAL_CAPSULE | Freq: Two times a day (BID) | ORAL | 0 refills | Status: DC
Start: 2022-02-23 — End: 2022-03-23

## 2022-02-23 MED ORDER — FILGRASTIM-SNDZ 480 MCG/0.8ML IJ SOSY
480.0000 ug | PREFILLED_SYRINGE | Freq: Once | INTRAMUSCULAR | Status: AC
  Administered 2022-02-23: 480 ug via SUBCUTANEOUS
  Filled 2022-02-23: qty 0.8

## 2022-02-23 NOTE — Telephone Encounter (Signed)
Dr Bobby Rumpf asked what she was taking the Plaquenil for? She is taking it for rheumatoid arthritis per Dr Madalyn Rob staff.  Dr Bobby Rumpf states, "it should be okay. We monitor her counts closely". ?

## 2022-02-23 NOTE — Patient Instructions (Signed)
Filgrastim, G-CSF injection °What is this medication? °FILGRASTIM, G-CSF (fil GRA stim) is a granulocyte colony-stimulating factor that stimulates the growth of neutrophils, a type of white blood cell (WBC) important in the body's fight against infection. It is used to reduce the incidence of fever and infection in patients with certain types of cancer who are receiving chemotherapy that affects the bone marrow, to stimulate blood cell production for removal of WBCs from the body prior to a bone marrow transplantation, to reduce the incidence of fever and infection in patients who have severe chronic neutropenia, and to improve survival outcomes following high-dose radiation exposure that is toxic to the bone marrow. °This medicine may be used for other purposes; ask your health care provider or pharmacist if you have questions. °COMMON BRAND NAME(S): Neupogen, Nivestym, Releuko, Zarxio °What should I tell my care team before I take this medication? °They need to know if you have any of these conditions: °kidney disease °latex allergy °ongoing radiation therapy °sickle cell disease °an unusual or allergic reaction to filgrastim, pegfilgrastim, other medicines, foods, dyes, or preservatives °pregnant or trying to get pregnant °breast-feeding °How should I use this medication? °This medicine is for injection under the skin or infusion into a vein. As an infusion into a vein, it is usually given by a health care professional in a hospital or clinic setting. If you get this medicine at home, you will be taught how to prepare and give this medicine. Refer to the Instructions for Use that come with your medication packaging. Use exactly as directed. Take your medicine at regular intervals. Do not take your medicine more often than directed. °It is important that you put your used needles and syringes in a special sharps container. Do not put them in a trash can. If you do not have a sharps container, call your pharmacist  or healthcare provider to get one. °Talk to your pediatrician regarding the use of this medicine in children. While this drug may be prescribed for children as young as 7 months for selected conditions, precautions do apply. °Overdosage: If you think you have taken too much of this medicine contact a poison control center or emergency room at once. °NOTE: This medicine is only for you. Do not share this medicine with others. °What if I miss a dose? °It is important not to miss your dose. Call your doctor or health care professional if you miss a dose. °What may interact with this medication? °This medicine may interact with the following medications: °medicines that may cause a release of neutrophils, such as lithium °This list may not describe all possible interactions. Give your health care provider a list of all the medicines, herbs, non-prescription drugs, or dietary supplements you use. Also tell them if you smoke, drink alcohol, or use illegal drugs. Some items may interact with your medicine. °What should I watch for while using this medication? °Your condition will be monitored carefully while you are receiving this medicine. °You may need blood work done while you are taking this medicine. °Talk to your health care provider about your risk of cancer. You may be more at risk for certain types of cancer if you take this medicine. °What side effects may I notice from receiving this medication? °Side effects that you should report to your doctor or health care professional as soon as possible: °allergic reactions like skin rash, itching or hives, swelling of the face, lips, or tongue °back pain °dizziness or feeling faint °fever °pain, redness, or   irritation at site where injected °pinpoint red spots on the skin °shortness of breath or breathing problems °signs and symptoms of kidney injury like trouble passing urine, change in the amount of urine, or red or dark-brown urine °stomach or side pain, or pain at  the shoulder °swelling °tiredness °unusual bleeding or bruising °Side effects that usually do not require medical attention (report to your doctor or health care professional if they continue or are bothersome): °bone pain °cough °diarrhea °hair loss °headache °muscle pain °This list may not describe all possible side effects. Call your doctor for medical advice about side effects. You may report side effects to FDA at 1-800-FDA-1088. °Where should I keep my medication? °Keep out of the reach of children. °Store in a refrigerator between 2 and 8 degrees C (36 and 46 degrees F). Do not freeze. Keep in carton to protect from light. Throw away this medicine if vials or syringes are left out of the refrigerator for more than 24 hours. Throw away any unused medicine after the expiration date. °NOTE: This sheet is a summary. It may not cover all possible information. If you have questions about this medicine, talk to your doctor, pharmacist, or health care provider. °© 2022 Elsevier/Gold Standard (2021-08-01 00:00:00) ° °

## 2022-02-26 ENCOUNTER — Inpatient Hospital Stay: Payer: Medicare HMO | Attending: Oncology

## 2022-02-26 DIAGNOSIS — D509 Iron deficiency anemia, unspecified: Secondary | ICD-10-CM | POA: Diagnosis not present

## 2022-02-26 DIAGNOSIS — C8198 Hodgkin lymphoma, unspecified, lymph nodes of multiple sites: Secondary | ICD-10-CM | POA: Diagnosis not present

## 2022-02-26 DIAGNOSIS — C8114 Nodular sclerosis classical Hodgkin lymphoma, lymph nodes of axilla and upper limb: Secondary | ICD-10-CM | POA: Insufficient documentation

## 2022-02-26 DIAGNOSIS — Z5189 Encounter for other specified aftercare: Secondary | ICD-10-CM | POA: Insufficient documentation

## 2022-02-26 DIAGNOSIS — C8118 Nodular sclerosis classical Hodgkin lymphoma, lymph nodes of multiple sites: Secondary | ICD-10-CM

## 2022-02-26 DIAGNOSIS — Z5112 Encounter for antineoplastic immunotherapy: Secondary | ICD-10-CM | POA: Insufficient documentation

## 2022-02-26 LAB — COMPREHENSIVE METABOLIC PANEL
Albumin: 3.8 (ref 3.5–5.0)
Calcium: 8.9 (ref 8.7–10.7)

## 2022-02-26 LAB — CBC: RBC: 3.64 — AB (ref 3.87–5.11)

## 2022-02-26 LAB — CBC AND DIFFERENTIAL
HCT: 34 — AB (ref 36–46)
Hemoglobin: 10.7 — AB (ref 12.0–16.0)
Neutrophils Absolute: 3.74
Platelets: 137 10*3/uL — AB (ref 150–400)
WBC: 5.2

## 2022-02-26 LAB — BASIC METABOLIC PANEL
BUN: 13 (ref 4–21)
CO2: 30 — AB (ref 13–22)
Chloride: 104 (ref 99–108)
Creatinine: 0.9 (ref 0.5–1.1)
Glucose: 116
Potassium: 4 mEq/L (ref 3.5–5.1)
Sodium: 139 (ref 137–147)

## 2022-02-26 LAB — HEPATIC FUNCTION PANEL
ALT: 16 U/L (ref 7–35)
AST: 26 (ref 13–35)
Alkaline Phosphatase: 136 — AB (ref 25–125)
Bilirubin, Total: 0.5

## 2022-02-27 ENCOUNTER — Encounter: Payer: Self-pay | Admitting: Oncology

## 2022-02-27 ENCOUNTER — Inpatient Hospital Stay: Payer: Medicare HMO

## 2022-02-27 DIAGNOSIS — C8118 Nodular sclerosis classical Hodgkin lymphoma, lymph nodes of multiple sites: Secondary | ICD-10-CM

## 2022-02-27 DIAGNOSIS — C8114 Nodular sclerosis classical Hodgkin lymphoma, lymph nodes of axilla and upper limb: Secondary | ICD-10-CM | POA: Diagnosis not present

## 2022-02-27 DIAGNOSIS — Z5189 Encounter for other specified aftercare: Secondary | ICD-10-CM | POA: Diagnosis not present

## 2022-02-27 DIAGNOSIS — Z5112 Encounter for antineoplastic immunotherapy: Secondary | ICD-10-CM | POA: Diagnosis not present

## 2022-02-27 MED ORDER — SODIUM CHLORIDE 0.9 % IV SOLN
1.8000 mg/kg | Freq: Once | INTRAVENOUS | Status: AC
  Administered 2022-02-27: 120 mg via INTRAVENOUS
  Filled 2022-02-27: qty 24

## 2022-02-27 MED ORDER — SODIUM CHLORIDE 0.9% FLUSH
10.0000 mL | INTRAVENOUS | Status: DC | PRN
  Administered 2022-02-27: 10 mL

## 2022-02-27 MED ORDER — LORAZEPAM 2 MG/ML IJ SOLN
0.5000 mg | Freq: Once | INTRAMUSCULAR | Status: AC | PRN
  Administered 2022-02-27: 0.5 mg via INTRAVENOUS
  Filled 2022-02-27: qty 1

## 2022-02-27 MED ORDER — DIPHENHYDRAMINE HCL 50 MG/ML IJ SOLN
50.0000 mg | Freq: Once | INTRAMUSCULAR | Status: AC
  Administered 2022-02-27: 50 mg via INTRAVENOUS
  Filled 2022-02-27: qty 1

## 2022-02-27 MED ORDER — DEXAMETHASONE SODIUM PHOSPHATE 10 MG/ML IJ SOLN
4.0000 mg | Freq: Once | INTRAMUSCULAR | Status: AC
  Administered 2022-02-27: 4 mg via INTRAVENOUS
  Filled 2022-02-27: qty 1

## 2022-02-27 MED ORDER — ACETAMINOPHEN 325 MG PO TABS
650.0000 mg | ORAL_TABLET | Freq: Once | ORAL | Status: AC
  Administered 2022-02-27: 650 mg via ORAL
  Filled 2022-02-27: qty 2

## 2022-02-27 MED ORDER — SODIUM CHLORIDE 0.9 % IV SOLN: Freq: Once | INTRAVENOUS | Status: AC

## 2022-02-27 MED ORDER — HEPARIN SOD (PORK) LOCK FLUSH 100 UNIT/ML IV SOLN
500.0000 [IU] | Freq: Once | INTRAVENOUS | Status: AC | PRN
  Administered 2022-02-27: 500 [IU]

## 2022-02-27 NOTE — Patient Instructions (Signed)
Brentuximab vedotin solution for injection ?What is this medication? ?BRENTUXIMAB VEDOTIN (bren TUX see mab ve DOE tin) is a monoclonal antibody and a chemotherapy drug. It is used for treating Hodgkin lymphoma and certain non-Hodgkin lymphomas, such as anaplastic large-cell lymphoma, mycosis fungoides, and peripheral T-cell lymphoma. ?This medicine may be used for other purposes; ask your health care provider or pharmacist if you have questions. ?COMMON BRAND NAME(S): ADCETRIS ?What should I tell my care team before I take this medication? ?They need to know if you have any of these conditions: ?immune system problems ?infection (especially a virus infection such as chickenpox, cold sores, or herpes) ?kidney disease ?liver disease ?low blood counts, like low white cell, platelet, or red cell counts ?tingling of the fingers or toes, or other nerve disorder ?an unusual or allergic reaction to brentuximab vedotin, other medicines, foods, dyes, or preservatives ?pregnant or trying to get pregnant ?breast-feeding ?How should I use this medication? ?This medicine is for infusion into a vein. It is given by a health care professional in a hospital or clinic setting. ?Talk to your pediatrician regarding the use of this medicine in children. Special care may be needed. ?Overdosage: If you think you have taken too much of this medicine contact a poison control center or emergency room at once. ?NOTE: This medicine is only for you. Do not share this medicine with others. ?What if I miss a dose? ?It is important not to miss your dose. Call your doctor or health care professional if you are unable to keep an appointment. ?What may interact with this medication? ?Do not take this medicine with any of the following medications: ?bleomycin ?This medicine may also interact with the following medications: ?ketoconazole ?rifampin ?St. John's wort; Hypericum perforatum ?This list may not describe all possible interactions. Give your  health care provider a list of all the medicines, herbs, non-prescription drugs, or dietary supplements you use. Also tell them if you smoke, drink alcohol, or use illegal drugs. Some items may interact with your medicine. ?What should I watch for while using this medication? ?Visit your doctor for checks on your progress. This drug may make you feel generally unwell. Report any side effects. Continue your course of treatment even though you feel ill unless your doctor tells you to stop. ?Call your doctor or health care professional for advice if you get a fever, chills or sore throat, or other symptoms of a cold or flu. Do not treat yourself. This drug decreases your body's ability to fight infections. Try to avoid being around people who are sick. ?This medicine may increase your risk to bruise or bleed. Call your doctor or health care professional if you notice any unusual bleeding. ?In some patients, this medicine may cause a serious brain infection that may cause death. If you have any problems seeing, thinking, speaking, walking, or standing, tell your doctor right away. If you cannot reach your doctor, urgently seek other source of medical care. ?Do not become pregnant while taking this medicine or for 6 months after stopping it. Women should inform their doctor if they wish to become pregnant or think they might be pregnant. Men should not father a child while taking this medicine and for 6 months after stopping it. There is a potential for serious side effects to an unborn child. Talk to your health care professional or pharmacist for more information. Do not breast-feed an infant while taking this medicine. ?This may interfere with the ability to father a child. You  should talk to your doctor or health care professional if you are concerned about your fertility. ?What side effects may I notice from receiving this medication? ?Side effects that you should report to your doctor or health care professional as  soon as possible: ?allergic reactions like skin rash, itching or hives, swelling of the face, lips, or tongue ?changes in emotions or moods ?diarrhea ?low blood counts - this medicine may decrease the number of white blood cells, red blood cells and platelets. You may be at increased risk for infections and bleeding. ?pain, tingling, numbness in the hands or feet ?redness, blistering, peeling or loosening of the skin, including inside the mouth ?shortness of breath ?signs of infection - fever or chills, cough, sore throat, pain or difficulty passing urine ?signs of decreased platelets or bleeding - bruising, pinpoint red spots on the skin, black, tarry stools, blood in the urine ?signs of decreased red blood cells - unusually weak or tired, fainting spells, lightheadedness ?signs of liver injury like dark yellow or brown urine; general ill feeling or flu-like symptoms; light-colored stools; loss of appetite; nausea; right upper belly pain; yellowing of the eyes or skin ?stomach pain ?sudden numbness or weakness of the face, arm or leg ?vomiting ?Side effects that usually do not require medical attention (report to your doctor or health care professional if they continue or are bothersome): ?constipation ?dizziness ?headache ?muscle pain ?tiredness ?This list may not describe all possible side effects. Call your doctor for medical advice about side effects. You may report side effects to FDA at 1-800-FDA-1088. ?Where should I keep my medication? ?This drug is given in a hospital or clinic and will not be stored at home. ?NOTE: This sheet is a summary. It may not cover all possible information. If you have questions about this medicine, talk to your doctor, pharmacist, or health care provider. ?? 2022 Elsevier/Gold Standard (2021-08-01 00:00:00) ? ?

## 2022-03-05 ENCOUNTER — Telehealth: Payer: Self-pay

## 2022-03-05 ENCOUNTER — Other Ambulatory Visit: Payer: Self-pay | Admitting: Pharmacist

## 2022-03-05 NOTE — Telephone Encounter (Signed)
-----   Message from Melodye Ped, NP sent at 03/05/2022  3:44 PM EDT ----- ?Regarding: RE: weak, nausea, vomiting, ?She is due for neupogen injections this week. If she wants to come in for fluids tomorrow, we can try that and IV zofran.  ?----- Message ----- ?From: Georgette Shell, RN ?Sent: 03/05/2022   3:38 PM EDT ?To: Melodye Ped, NP ?Subject: weak, nausea, vomiting,                       ? ?C/o weak, nausea, vomiting, can't eat, loosing weight.  Has took Zofran this morning.  I suggested taking an Ativan, she said it really makes her sleepy.  I did mention she could cut in half.  She is due at infusion on two different days this week and then to see Dr .Bobby Rumpf next Tuesday. ? ? ?

## 2022-03-05 NOTE — Telephone Encounter (Signed)
Pt is aware that infusions will give her fluids and IV Zofan at 8:45am on 03/06/2022. ? ?

## 2022-03-06 ENCOUNTER — Inpatient Hospital Stay: Payer: Medicare HMO

## 2022-03-06 ENCOUNTER — Other Ambulatory Visit: Payer: Self-pay | Admitting: Pharmacist

## 2022-03-06 ENCOUNTER — Other Ambulatory Visit: Payer: Self-pay

## 2022-03-06 VITALS — BP 142/61 | HR 105 | Temp 97.8°F | Resp 18 | Wt 137.1 lb

## 2022-03-06 DIAGNOSIS — D649 Anemia, unspecified: Secondary | ICD-10-CM | POA: Diagnosis not present

## 2022-03-06 DIAGNOSIS — C8118 Nodular sclerosis classical Hodgkin lymphoma, lymph nodes of multiple sites: Secondary | ICD-10-CM

## 2022-03-06 DIAGNOSIS — D702 Other drug-induced agranulocytosis: Secondary | ICD-10-CM

## 2022-03-06 DIAGNOSIS — C8114 Nodular sclerosis classical Hodgkin lymphoma, lymph nodes of axilla and upper limb: Secondary | ICD-10-CM | POA: Diagnosis not present

## 2022-03-06 DIAGNOSIS — R197 Diarrhea, unspecified: Secondary | ICD-10-CM

## 2022-03-06 DIAGNOSIS — Z5189 Encounter for other specified aftercare: Secondary | ICD-10-CM | POA: Diagnosis not present

## 2022-03-06 DIAGNOSIS — Z5112 Encounter for antineoplastic immunotherapy: Secondary | ICD-10-CM | POA: Diagnosis not present

## 2022-03-06 MED ORDER — ONDANSETRON HCL 4 MG/2ML IJ SOLN
8.0000 mg | Freq: Once | INTRAMUSCULAR | Status: AC
  Administered 2022-03-06: 8 mg via INTRAVENOUS
  Filled 2022-03-06: qty 4

## 2022-03-06 MED ORDER — SODIUM CHLORIDE 0.9 % IV SOLN: Freq: Once | INTRAVENOUS | Status: AC

## 2022-03-06 NOTE — Progress Notes (Signed)
Patient attempted to provide urine specimen without success- Patient and family educated on obtaining clean catch urine at home. State understanding to refrigerate specimen and bring to office at [facility] [address] with 24 hours of obtaining. ? ?Patient and family also instructed on nausea control- state understanding to call center for any continued or worsening nausea and vomiting ? ?Patient states she will not take imodium for diarrhea and would prefer to have diarrhea rather than take imodium for fear of constipation.  ?

## 2022-03-06 NOTE — Patient Instructions (Signed)
Nausea, Adult ?Nausea is feeling like you may vomit. Feeling like you may vomit is usually not serious, but it may be an early sign of a more serious medical problem. Vomiting is when stomach contents forcefully come out of your mouth. ?If you vomit, or if you are not able to drink enough fluids, you may not have enough water in your body (get dehydrated). If you do not have enough water in your body, you may: ?Feel tired. ?Feel thirsty. ?Have a dry mouth. ?Have cracked lips. ?Pee (urinate) less often. ?Older adults and people who have other diseases or a weak body defense system (immune system) have a higher risk of not having enough water in the body. ?The main goals of treating this condition are: ?To relieve your nausea. ?To ensure your nausea occurs less often. ?To prevent vomiting and losing too much fluid. ?Follow these instructions at home: ?Watch your symptoms for any changes. Tell your doctor about them. ?Eating and drinking ?  ?Take an ORS (oral rehydration solution). This is a drink that is sold at pharmacies and stores. ?Drink clear fluids in small amounts as you are able. These include: ?Water. ?Ice chips. ?Fruit juice that has water added (diluted fruit juice). ?Low-calorie sports drinks. ?Eat bland, easy-to-digest foods in small amounts as you are able, such as: ?Bananas. ?Applesauce. ?Rice. ?Low-fat (lean) meats. ?Toast. ?Crackers. ?Avoid drinking fluids that have a lot of sugar or caffeine in them. This includes energy drinks, sports drinks, and soda. ?Avoid alcohol. ?Avoid spicy or fatty foods. ?General instructions ?Take over-the-counter and prescription medicines only as told by your doctor. ?Rest at home while you get better. ?Drink enough fluid to keep your pee (urine) pale yellow. ?Take slow and deep breaths when you feel like you may vomit. ?Avoid food or things that have strong smells. ?Wash your hands often with soap and water for at least 20 seconds. If you cannot use soap and water, use  hand sanitizer. ?Make sure that everyone in your home washes their hands well and often. ?Keep all follow-up visits. ?Contact a doctor if: ?You feel worse. ?You feel like you may vomit and this lasts for more than 2 days. ?You vomit. ?You are not able to drink fluids without vomiting. ?You have new symptoms. ?You have a fever. ?You have a headache. ?You have muscle cramps. ?You have a rash. ?You have pain while peeing. ?You feel light-headed or dizzy. ?Get help right away if: ?You have pain in your chest, neck, arm, or jaw. ?You feel very weak or you faint. ?You have vomit that is bright red or looks like coffee grounds. ?You have bloody or black poop (stools) or poop that looks like tar. ?You have a very bad headache, a stiff neck, or both. ?You have very bad pain, cramping, or bloating in your belly (abdomen). ?You have trouble breathing or you are breathing very quickly. ?Your heart is beating very quickly. ?Your skin feels cold and clammy. ?You feel confused. ?You have signs of losing too much water in your body, such as: ?Dark pee, very little pee, or no pee. ?Cracked lips. ?Dry mouth. ?Sunken eyes. ?Sleepiness. ?Weakness. ?These symptoms may be an emergency. Get help right away. Call 911. ?Do not wait to see if the symptoms will go away. ?Do not drive yourself to the hospital. ?Summary ?Nausea is feeling like you are about vomit. ?If you vomit, or if you are not able to drink enough fluids, you may not have enough water in your body (  get dehydrated). ?Eat and drink what your doctor tells you. Take over-the-counter and prescription medicines only as told by your doctor. ?Contact a doctor right away if your symptoms get worse or you have new symptoms. ?Keep all follow-up visits. ?This information is not intended to replace advice given to you by your health care provider. Make sure you discuss any questions you have with your health care provider. ?Document Revised: 05/19/2021 Document Reviewed:  05/19/2021 ?Elsevier Patient Education ? [employer]. ? ?

## 2022-03-08 ENCOUNTER — Inpatient Hospital Stay: Payer: Medicare HMO

## 2022-03-08 ENCOUNTER — Other Ambulatory Visit: Payer: Self-pay | Admitting: Hematology and Oncology

## 2022-03-08 ENCOUNTER — Other Ambulatory Visit: Payer: Self-pay

## 2022-03-08 VITALS — BP 143/66 | HR 99 | Temp 98.2°F | Resp 18 | Ht 62.25 in | Wt 140.0 lb

## 2022-03-08 DIAGNOSIS — D702 Other drug-induced agranulocytosis: Secondary | ICD-10-CM

## 2022-03-08 DIAGNOSIS — C8114 Nodular sclerosis classical Hodgkin lymphoma, lymph nodes of axilla and upper limb: Secondary | ICD-10-CM | POA: Diagnosis not present

## 2022-03-08 DIAGNOSIS — Z5112 Encounter for antineoplastic immunotherapy: Secondary | ICD-10-CM | POA: Diagnosis not present

## 2022-03-08 DIAGNOSIS — R300 Dysuria: Secondary | ICD-10-CM

## 2022-03-08 DIAGNOSIS — Z5189 Encounter for other specified aftercare: Secondary | ICD-10-CM | POA: Diagnosis not present

## 2022-03-08 MED ORDER — FILGRASTIM-SNDZ 480 MCG/0.8ML IJ SOSY
480.0000 ug | PREFILLED_SYRINGE | Freq: Once | INTRAMUSCULAR | Status: AC
  Administered 2022-03-08: 480 ug via SUBCUTANEOUS
  Filled 2022-03-08: qty 0.8

## 2022-03-08 NOTE — Patient Instructions (Signed)
Filgrastim, G-CSF injection °What is this medication? °FILGRASTIM, G-CSF (fil GRA stim) is a granulocyte colony-stimulating factor that stimulates the growth of neutrophils, a type of white blood cell (WBC) important in the body's fight against infection. It is used to reduce the incidence of fever and infection in patients with certain types of cancer who are receiving chemotherapy that affects the bone marrow, to stimulate blood cell production for removal of WBCs from the body prior to a bone marrow transplantation, to reduce the incidence of fever and infection in patients who have severe chronic neutropenia, and to improve survival outcomes following high-dose radiation exposure that is toxic to the bone marrow. °This medicine may be used for other purposes; ask your health care provider or pharmacist if you have questions. °COMMON BRAND NAME(S): Neupogen, Nivestym, Releuko, Zarxio °What should I tell my care team before I take this medication? °They need to know if you have any of these conditions: °kidney disease °latex allergy °ongoing radiation therapy °sickle cell disease °an unusual or allergic reaction to filgrastim, pegfilgrastim, other medicines, foods, dyes, or preservatives °pregnant or trying to get pregnant °breast-feeding °How should I use this medication? °This medicine is for injection under the skin or infusion into a vein. As an infusion into a vein, it is usually given by a health care professional in a hospital or clinic setting. If you get this medicine at home, you will be taught how to prepare and give this medicine. Refer to the Instructions for Use that come with your medication packaging. Use exactly as directed. Take your medicine at regular intervals. Do not take your medicine more often than directed. °It is important that you put your used needles and syringes in a special sharps container. Do not put them in a trash can. If you do not have a sharps container, call your pharmacist  or healthcare provider to get one. °Talk to your pediatrician regarding the use of this medicine in children. While this drug may be prescribed for children as young as 7 months for selected conditions, precautions do apply. °Overdosage: If you think you have taken too much of this medicine contact a poison control center or emergency room at once. °NOTE: This medicine is only for you. Do not share this medicine with others. °What if I miss a dose? °It is important not to miss your dose. Call your doctor or health care professional if you miss a dose. °What may interact with this medication? °This medicine may interact with the following medications: °medicines that may cause a release of neutrophils, such as lithium °This list may not describe all possible interactions. Give your health care provider a list of all the medicines, herbs, non-prescription drugs, or dietary supplements you use. Also tell them if you smoke, drink alcohol, or use illegal drugs. Some items may interact with your medicine. °What should I watch for while using this medication? °Your condition will be monitored carefully while you are receiving this medicine. °You may need blood work done while you are taking this medicine. °Talk to your health care provider about your risk of cancer. You may be more at risk for certain types of cancer if you take this medicine. °What side effects may I notice from receiving this medication? °Side effects that you should report to your doctor or health care professional as soon as possible: °allergic reactions like skin rash, itching or hives, swelling of the face, lips, or tongue °back pain °dizziness or feeling faint °fever °pain, redness, or   irritation at site where injected °pinpoint red spots on the skin °shortness of breath or breathing problems °signs and symptoms of kidney injury like trouble passing urine, change in the amount of urine, or red or dark-brown urine °stomach or side pain, or pain at  the shoulder °swelling °tiredness °unusual bleeding or bruising °Side effects that usually do not require medical attention (report to your doctor or health care professional if they continue or are bothersome): °bone pain °cough °diarrhea °hair loss °headache °muscle pain °This list may not describe all possible side effects. Call your doctor for medical advice about side effects. You may report side effects to FDA at 1-800-FDA-1088. °Where should I keep my medication? °Keep out of the reach of children. °Store in a refrigerator between 2 and 8 degrees C (36 and 46 degrees F). Do not freeze. Keep in carton to protect from light. Throw away this medicine if vials or syringes are left out of the refrigerator for more than 24 hours. Throw away any unused medicine after the expiration date. °NOTE: This sheet is a summary. It may not cover all possible information. If you have questions about this medicine, talk to your doctor, pharmacist, or health care provider. °© 2022 Elsevier/Gold Standard (2021-08-01 00:00:00) ° °

## 2022-03-09 ENCOUNTER — Inpatient Hospital Stay: Payer: Medicare HMO

## 2022-03-09 ENCOUNTER — Other Ambulatory Visit: Payer: Self-pay | Admitting: Hematology and Oncology

## 2022-03-09 VITALS — BP 146/64 | HR 105 | Temp 98.5°F | Resp 18 | Wt 137.0 lb

## 2022-03-09 DIAGNOSIS — D702 Other drug-induced agranulocytosis: Secondary | ICD-10-CM

## 2022-03-09 DIAGNOSIS — C8114 Nodular sclerosis classical Hodgkin lymphoma, lymph nodes of axilla and upper limb: Secondary | ICD-10-CM | POA: Diagnosis not present

## 2022-03-09 DIAGNOSIS — Z5112 Encounter for antineoplastic immunotherapy: Secondary | ICD-10-CM | POA: Diagnosis not present

## 2022-03-09 DIAGNOSIS — Z5189 Encounter for other specified aftercare: Secondary | ICD-10-CM | POA: Diagnosis not present

## 2022-03-09 MED ORDER — FILGRASTIM-SNDZ 480 MCG/0.8ML IJ SOSY
480.0000 ug | PREFILLED_SYRINGE | Freq: Once | INTRAMUSCULAR | Status: AC
  Administered 2022-03-09: 480 ug via SUBCUTANEOUS
  Filled 2022-03-09: qty 0.8

## 2022-03-09 MED ORDER — FOSFOMYCIN TROMETHAMINE 3 G PO PACK: 3.0000 g | PACK | Freq: Once | ORAL | 0 refills | Status: AC

## 2022-03-09 NOTE — Progress Notes (Signed)
Dayton Scrape aware of patients continued concerns with possible UTI- Also notified of patient's new cough.  ?

## 2022-03-09 NOTE — Patient Instructions (Signed)

## 2022-03-12 ENCOUNTER — Other Ambulatory Visit: Payer: Self-pay | Admitting: Oncology

## 2022-03-13 ENCOUNTER — Other Ambulatory Visit: Payer: Medicare HMO

## 2022-03-13 ENCOUNTER — Other Ambulatory Visit: Payer: Self-pay | Admitting: Oncology

## 2022-03-13 ENCOUNTER — Other Ambulatory Visit: Payer: Self-pay | Admitting: Hematology and Oncology

## 2022-03-13 ENCOUNTER — Ambulatory Visit: Payer: Medicare HMO | Admitting: Oncology

## 2022-03-13 ENCOUNTER — Inpatient Hospital Stay: Payer: Medicare HMO

## 2022-03-13 DIAGNOSIS — C8118 Nodular sclerosis classical Hodgkin lymphoma, lymph nodes of multiple sites: Secondary | ICD-10-CM

## 2022-03-13 DIAGNOSIS — R059 Cough, unspecified: Secondary | ICD-10-CM | POA: Diagnosis not present

## 2022-03-13 DIAGNOSIS — C8114 Nodular sclerosis classical Hodgkin lymphoma, lymph nodes of axilla and upper limb: Secondary | ICD-10-CM | POA: Diagnosis not present

## 2022-03-13 DIAGNOSIS — Z5112 Encounter for antineoplastic immunotherapy: Secondary | ICD-10-CM | POA: Diagnosis not present

## 2022-03-13 DIAGNOSIS — C8198 Hodgkin lymphoma, unspecified, lymph nodes of multiple sites: Secondary | ICD-10-CM | POA: Diagnosis not present

## 2022-03-13 DIAGNOSIS — Z5189 Encounter for other specified aftercare: Secondary | ICD-10-CM | POA: Diagnosis not present

## 2022-03-13 LAB — CBC AND DIFFERENTIAL
HCT: 34 — AB (ref 36–46)
Hemoglobin: 10.8 — AB (ref 12.0–16.0)
Neutrophils Absolute: 0.65
Platelets: 169 10*3/uL (ref 150–400)
WBC: 2.5

## 2022-03-13 LAB — COMPREHENSIVE METABOLIC PANEL
Albumin: 3.8 (ref 3.5–5.0)
Calcium: 8.9 (ref 8.7–10.7)

## 2022-03-13 LAB — HEPATIC FUNCTION PANEL
ALT: 24 U/L (ref 7–35)
AST: 43 — AB (ref 13–35)
Alkaline Phosphatase: 107 (ref 25–125)
Bilirubin, Total: 0.6

## 2022-03-13 LAB — CBC: RBC: 3.64 — AB (ref 3.87–5.11)

## 2022-03-13 LAB — BASIC METABOLIC PANEL
BUN: 16 (ref 4–21)
CO2: 26 — AB (ref 13–22)
Chloride: 104 (ref 99–108)
Creatinine: 0.8 (ref 0.5–1.1)
Glucose: 180
Potassium: 3.6 mEq/L (ref 3.5–5.1)
Sodium: 141 (ref 137–147)

## 2022-03-13 LAB — SEDIMENTATION RATE: Sed Rate: 26 mm/hr — ABNORMAL HIGH (ref 0–22)

## 2022-03-14 ENCOUNTER — Encounter: Payer: Self-pay | Admitting: Oncology

## 2022-03-14 ENCOUNTER — Encounter: Payer: Self-pay | Admitting: Hematology and Oncology

## 2022-03-15 ENCOUNTER — Other Ambulatory Visit: Payer: Self-pay | Admitting: Pharmacist

## 2022-03-15 ENCOUNTER — Ambulatory Visit: Payer: Medicare HMO

## 2022-03-15 DIAGNOSIS — J4530 Mild persistent asthma, uncomplicated: Secondary | ICD-10-CM | POA: Diagnosis not present

## 2022-03-15 DIAGNOSIS — J40 Bronchitis, not specified as acute or chronic: Secondary | ICD-10-CM | POA: Diagnosis not present

## 2022-03-15 DIAGNOSIS — N390 Urinary tract infection, site not specified: Secondary | ICD-10-CM | POA: Diagnosis not present

## 2022-03-15 DIAGNOSIS — C8114 Nodular sclerosis classical Hodgkin lymphoma, lymph nodes of axilla and upper limb: Secondary | ICD-10-CM | POA: Diagnosis not present

## 2022-03-15 DIAGNOSIS — Z6824 Body mass index (BMI) 24.0-24.9, adult: Secondary | ICD-10-CM | POA: Diagnosis not present

## 2022-03-15 DIAGNOSIS — J329 Chronic sinusitis, unspecified: Secondary | ICD-10-CM | POA: Diagnosis not present

## 2022-03-15 NOTE — Progress Notes (Signed)
?[facility]  ?[address] ?[place],  [place]  [address] ?[phone] ? ?Clinic Day:  03/16/2022 ? ?Referring physician: Serita Grammes, MD ? ?HISTORY OF PRESENT ILLNESS:  ?The patient is a 76 y.o. female with stage IIIA classical Hodgkin lymphoma.  She comes in today to be evaluated before heading into her first cycle of Adriamycin/vinblastine/dacarbazine (AVD) chemotherapy.  She recently completed her 2 cycles of brentuximab vedotin.  She claims to tolerated those 2 cycles very well.  However, she has run into other problems, including a productive cough for yellowish-green phlegm.  The patient claims she was just started on azithromycin by her primary care office yesterday.  She continues to deal with a painful urinary tract infection.  She has been given IV Invanz over these last few days to treat this.  However, dysuria persists.  She also claims her primary care office gave her fluconazole due to having a concurrent yeast infection.  Overall, the patient feels somewhat poorly and is nervous about proceeding forth her first cycle of chemotherapy next week.  As it pertains to her disease, she denies having any B symptoms which concern her for overt signs of disease progression while on treatment. ? ?PHYSICAL EXAM:  ?Blood pressure (!) 235/97, pulse 100, temperature 98.8 ?F (37.1 ?C), resp. rate 14, height 5' 2.5" (1.588 m), weight 139 lb 12.8 oz (63.4 kg), SpO2 98 %. ?Wt Readings from Last 3 Encounters:  ?03/16/22 139 lb 12.8 oz (63.4 kg)  ?03/09/22 137 lb (62.1 kg)  ?03/08/22 140 lb (63.5 kg)  ? ?Body mass index is 25.16 kg/m?Marland Kitchen ?Performance status (ECOG): 1 - Symptomatic but completely ambulatory ?Physical Exam ?Constitutional:   ?   Appearance: Normal appearance.  ?   Comments: Very anxious, with pessimistic outlook  ?HENT:  ?   Mouth/Throat:  ?   Pharynx: Oropharynx is clear. No oropharyngeal exudate.  ?Cardiovascular:  ?   Rate and Rhythm: Normal rate and regular  rhythm.  ?   Heart sounds: No murmur heard. ?  No friction rub. No gallop.  ?Pulmonary:  ?   Breath sounds: Normal breath sounds.  ?Chest:  ?Breasts: ?   Right: No swelling, bleeding, inverted nipple, mass, nipple discharge or skin change.  ?   Left: No swelling, bleeding, inverted nipple, mass, nipple discharge or skin change.  ?Abdominal:  ?   General: Bowel sounds are normal. There is no distension.  ?   Palpations: Abdomen is soft. There is no mass.  ?   Tenderness: There is no abdominal tenderness.  ?Musculoskeletal:     ?   General: No tenderness.  ?   Cervical back: Normal range of motion and neck supple.  ?   Right lower leg: No edema.  ?   Left lower leg: No edema.  ?Lymphadenopathy:  ?   Cervical: No cervical adenopathy.  ?   Right cervical: No superficial, deep or posterior cervical adenopathy. ?   Left cervical: No superficial, deep or posterior cervical adenopathy.  ?   Upper Body:  ?   Right upper body: No supraclavicular or axillary adenopathy.  ?   Left upper body: No supraclavicular or axillary adenopathy.  ?   Lower Body: No right inguinal adenopathy. No left inguinal adenopathy.  ?Skin: ?   Coloration: Skin is not jaundiced.  ?   Findings: No lesion or rash.  ?Neurological:  ?   General: No focal deficit present.  ?   Mental Status: She is alert and  oriented to person, place, and time. Mental status is at baseline.  ?Psychiatric:     ?   Mood and Affect: Mood normal.     ?   Behavior: Behavior normal.     ?   Thought Content: Thought content normal.     ?   Judgment: Judgment normal.  ? ?LABS: ? Latest Reference Range & Units 03/13/22 00:00  ?WBC  2.5 (E)  ?RBC 3.87 - 5.11  3.64 ! (E)  ?Hemoglobin 12.0 - 16.0  10.8 ! (E)  ?HCT 36 - 46  34 ! (E)  ?Platelets 150 - 400 K/uL 169 (E)  ?NEUT#  0.65 (E)  ?!: Data is abnormal ?(E): External lab result ? Latest Reference Range & Units 03/13/22 00:00  ?Sodium 137 - 147  141 (E)  ?Potassium 3.5 - 5.1 mEq/L 3.6 (E)  ?Chloride 99 - 108  104 (E)  ?CO2 13 - 22   26 ! (E)  ?Glucose  180 (E)  ?BUN 4 - 21  16 (E)  ?Creatinine 0.5 - 1.1  0.8 (E)  ?Calcium 8.7 - 10.7  8.9 (E)  ?Alkaline Phosphatase 25 - 125  107 (E)  ?Albumin 3.5 - 5.0  3.8 (E)  ?AST 13 - 35  43 ! (E)  ?ALT 7 - 35 U/L 24 (E)  ?Bilirubin, Total  0.6 (E)  ?!: Data is abnormal ?(E): External lab result ? ?ASSESSMENT & PLAN:  ?Assessment/Plan:  A 76 y.o. female with stage IIIA classical Hodgkin lymphoma, nodular sclerosis subtype.  The plan is for her to proceed with her first cycle of AVD chemotherapy next week.  However, she remains neutropenic despite receiving white cell shot therapy earlier this week.  There is still a chance her white count will improve over these next few days to where it will be high enough for her to proceed with her AVD chemotherapy next week.  I will check her CBC early next week to verify this.  With respect to her productive cough, the patient is already on azithromycin.  As her coughing has been extremely problematic, I will also prescribe her Tessalon Perles.  As her coughing has caused significant musculoskeletal pain, I will also prescribe her tramadol for pain relief.  Most of this visit today was spent trying to reassure the patient and her family that everything will be done to maintain as good of a quality of life as she goes through her chemotherapy.  I am uncertain as to whether she will proceed with her first cycle of AVD early next week.  If she is still coughing and having urinary symptoms, I will likely delay her AVD chemotherapy for 1 week.  Of note, a urine culture will be rechecked today to ensure her IV antibiotics are working and clearing her urinary tract infection.  I will see this patient back in mid May to ensure that she is doing well as she goes through her first few treatments of AVD.  Both the patient and her family understand all the plans discussed today and know to contact our office before her next visit if she runs into any problems that require immediate  clinical attention.   ? ?Faiza Bansal Macarthur Critchley, MD ? ? ? ?  ?

## 2022-03-16 ENCOUNTER — Other Ambulatory Visit: Payer: Medicare HMO

## 2022-03-16 ENCOUNTER — Inpatient Hospital Stay (INDEPENDENT_AMBULATORY_CARE_PROVIDER_SITE_OTHER): Payer: Medicare HMO | Admitting: Oncology

## 2022-03-16 ENCOUNTER — Other Ambulatory Visit: Payer: Self-pay

## 2022-03-16 ENCOUNTER — Other Ambulatory Visit: Payer: Self-pay | Admitting: Oncology

## 2022-03-16 VITALS — BP 235/97 | HR 100 | Temp 98.8°F | Resp 14 | Ht 62.5 in | Wt 139.8 lb

## 2022-03-16 DIAGNOSIS — N390 Urinary tract infection, site not specified: Secondary | ICD-10-CM | POA: Diagnosis not present

## 2022-03-16 DIAGNOSIS — R300 Dysuria: Secondary | ICD-10-CM

## 2022-03-16 DIAGNOSIS — C8118 Nodular sclerosis classical Hodgkin lymphoma, lymph nodes of multiple sites: Secondary | ICD-10-CM | POA: Diagnosis not present

## 2022-03-16 MED ORDER — TRAMADOL HCL 50 MG PO TABS: 50.0000 mg | ORAL_TABLET | Freq: Four times a day (QID) | ORAL | 0 refills | Status: DC | PRN

## 2022-03-16 MED ORDER — BENZONATATE 100 MG PO CAPS
100.0000 mg | ORAL_CAPSULE | Freq: Three times a day (TID) | ORAL | 1 refills | Status: DC | PRN
Start: 2022-03-16 — End: 2023-09-19

## 2022-03-18 ENCOUNTER — Encounter: Payer: Self-pay | Admitting: Oncology

## 2022-03-19 ENCOUNTER — Other Ambulatory Visit: Payer: Self-pay | Admitting: Hematology and Oncology

## 2022-03-19 ENCOUNTER — Inpatient Hospital Stay: Payer: Medicare HMO

## 2022-03-19 ENCOUNTER — Encounter: Payer: Self-pay | Admitting: Oncology

## 2022-03-19 DIAGNOSIS — C8198 Hodgkin lymphoma, unspecified, lymph nodes of multiple sites: Secondary | ICD-10-CM | POA: Diagnosis not present

## 2022-03-19 DIAGNOSIS — C8118 Nodular sclerosis classical Hodgkin lymphoma, lymph nodes of multiple sites: Secondary | ICD-10-CM

## 2022-03-19 DIAGNOSIS — R300 Dysuria: Secondary | ICD-10-CM

## 2022-03-19 LAB — COMPREHENSIVE METABOLIC PANEL
Albumin: 3.7 (ref 3.5–5.0)
Calcium: 8.9 (ref 8.7–10.7)

## 2022-03-19 LAB — CBC AND DIFFERENTIAL
HCT: 34 — AB (ref 36–46)
Hemoglobin: 10.9 — AB (ref 12.0–16.0)
Neutrophils Absolute: 1.7
Platelets: 158 10*3/uL (ref 150–400)
WBC: 2.7

## 2022-03-19 LAB — BASIC METABOLIC PANEL
BUN: 11 (ref 4–21)
CO2: 31 — AB (ref 13–22)
Chloride: 102 (ref 99–108)
Creatinine: 0.9 (ref 0.5–1.1)
Glucose: 147
Potassium: 3.8 mEq/L (ref 3.5–5.1)
Sodium: 141 (ref 137–147)

## 2022-03-19 LAB — HEPATIC FUNCTION PANEL
ALT: 18 U/L (ref 7–35)
AST: 29 (ref 13–35)
Alkaline Phosphatase: 99 (ref 25–125)
Bilirubin, Total: 0.6

## 2022-03-19 LAB — CBC
MCV: 94 (ref 81–99)
RBC: 3.62 — AB (ref 3.87–5.11)

## 2022-03-20 ENCOUNTER — Inpatient Hospital Stay: Payer: Medicare HMO

## 2022-03-21 ENCOUNTER — Encounter: Payer: Self-pay | Admitting: Oncology

## 2022-03-21 ENCOUNTER — Other Ambulatory Visit: Payer: Self-pay | Admitting: Oncology

## 2022-03-23 ENCOUNTER — Other Ambulatory Visit: Payer: Self-pay | Admitting: Hematology and Oncology

## 2022-03-23 MED ORDER — NITROFURANTOIN MONOHYD MACRO 100 MG PO CAPS: 100.0000 mg | ORAL_CAPSULE | Freq: Two times a day (BID) | ORAL | 0 refills | Status: DC

## 2022-03-26 ENCOUNTER — Inpatient Hospital Stay: Payer: Medicare HMO | Attending: Oncology

## 2022-03-26 VITALS — BP 177/84 | HR 92 | Temp 98.2°F | Resp 18 | Ht 62.5 in | Wt 134.0 lb

## 2022-03-26 DIAGNOSIS — Z79899 Other long term (current) drug therapy: Secondary | ICD-10-CM | POA: Insufficient documentation

## 2022-03-26 DIAGNOSIS — Z5111 Encounter for antineoplastic chemotherapy: Secondary | ICD-10-CM | POA: Insufficient documentation

## 2022-03-26 DIAGNOSIS — C8118 Nodular sclerosis classical Hodgkin lymphoma, lymph nodes of multiple sites: Secondary | ICD-10-CM | POA: Insufficient documentation

## 2022-03-26 MED ORDER — SODIUM CHLORIDE 0.9% FLUSH
10.0000 mL | INTRAVENOUS | Status: DC | PRN
  Administered 2022-03-26: 10 mL

## 2022-03-26 MED ORDER — SODIUM CHLORIDE 0.9 % IV SOLN
150.0000 mg | Freq: Once | INTRAVENOUS | Status: AC
  Administered 2022-03-26: 150 mg via INTRAVENOUS
  Filled 2022-03-26: qty 150

## 2022-03-26 MED ORDER — SODIUM CHLORIDE 0.9 % IV SOLN
362.0000 mg/m2 | Freq: Once | INTRAVENOUS | Status: AC
  Administered 2022-03-26: 600 mg via INTRAVENOUS
  Filled 2022-03-26: qty 60

## 2022-03-26 MED ORDER — DEXAMETHASONE SODIUM PHOSPHATE 10 MG/ML IJ SOLN
4.0000 mg | Freq: Once | INTRAMUSCULAR | Status: AC
  Administered 2022-03-26: 4 mg via INTRAVENOUS
  Filled 2022-03-26: qty 1

## 2022-03-26 MED ORDER — HEPARIN SOD (PORK) LOCK FLUSH 100 UNIT/ML IV SOLN
500.0000 [IU] | Freq: Once | INTRAVENOUS | Status: AC | PRN
  Administered 2022-03-26: 500 [IU]

## 2022-03-26 MED ORDER — DOXORUBICIN HCL CHEMO IV INJECTION 2 MG/ML
25.0000 mg/m2 | Freq: Once | INTRAVENOUS | Status: AC
  Administered 2022-03-26: 42 mg via INTRAVENOUS
  Filled 2022-03-26: qty 21

## 2022-03-26 MED ORDER — VINBLASTINE SULFATE CHEMO INJECTION 1 MG/ML
6.0000 mg/m2 | Freq: Once | INTRAVENOUS | Status: AC
  Administered 2022-03-26: 10 mg via INTRAVENOUS
  Filled 2022-03-26: qty 10

## 2022-03-26 MED ORDER — SODIUM CHLORIDE 0.9 % IV SOLN: Freq: Once | INTRAVENOUS | Status: AC

## 2022-03-26 MED ORDER — PALONOSETRON HCL INJECTION 0.25 MG/5ML
0.2500 mg | Freq: Once | INTRAVENOUS | Status: AC
  Administered 2022-03-26: 0.25 mg via INTRAVENOUS
  Filled 2022-03-26: qty 5

## 2022-03-26 MED ORDER — LORAZEPAM 2 MG/ML IJ SOLN
0.5000 mg | Freq: Once | INTRAMUSCULAR | Status: DC | PRN
  Filled 2022-03-26: qty 1

## 2022-03-26 NOTE — Patient Instructions (Signed)
Vinblastine injection ?What is this medication? ?VINBLASTINE (vin BLAS teen) is a chemotherapy drug. It slows the growth of cancer cells. This medicine is used to treat many types of cancer like breast cancer, testicular cancer, Hodgkin's disease, non-Hodgkin's lymphoma, and sarcoma. ?This medicine may be used for other purposes; ask your health care provider or pharmacist if you have questions. ?COMMON BRAND NAME(S): Velban ?What should I tell my care team before I take this medication? ?They need to know if you have any of these conditions: ?blood disorders ?dental disease ?gout ?infection (especially a virus infection such as chickenpox, cold sores, or herpes) ?liver disease ?lung disease ?nervous system disease ?recent or ongoing radiation therapy ?an unusual or allergic reaction to vinblastine, other chemotherapy agents, other medicines, foods, dyes, or preservatives ?pregnant or trying to get pregnant ?breast-feeding ?How should I use this medication? ?This drug is given as an infusion into a vein. It is administered in a hospital or clinic by a specially trained health care professional. If you have pain, swelling, burning or any unusual feeling around the site of your injection, tell your health care professional right away. ?Talk to your pediatrician regarding the use of this medicine in children. While this drug may be prescribed for selected conditions, precautions do apply. ?Overdosage: If you think you have taken too much of this medicine contact a poison control center or emergency room at once. ?NOTE: This medicine is only for you. Do not share this medicine with others. ?What if I miss a dose? ?It is important not to miss your dose. Call your doctor or health care professional if you are unable to keep an appointment. ?What may interact with this medication? ?erythromycin ?certain medicines for fungal infections like itraconazole, ketoconazole, posaconazole, voriconazole ?certain medicines for  seizures like phenytoin ?This list may not describe all possible interactions. Give your health care provider a list of all the medicines, herbs, non-prescription drugs, or dietary supplements you use. Also tell them if you smoke, drink alcohol, or use illegal drugs. Some items may interact with your medicine. ?What should I watch for while using this medication? ?Your condition will be monitored carefully while you are receiving this medicine. You will need important blood work done while you are taking this medicine. ?This drug may make you feel generally unwell. This is not uncommon, as chemotherapy can affect healthy cells as well as cancer cells. Report any side effects. Continue your course of treatment even though you feel ill unless your doctor tells you to stop. ?In some cases, you may be given additional medicines to help with side effects. Follow all directions for their use. ?Call your doctor or health care professional for advice if you get a fever, chills or sore throat, or other symptoms of a cold or flu. Do not treat yourself. This drug decreases your body's ability to fight infections. Try to avoid being around people who are sick. ?This medicine may increase your risk to bruise or bleed. Call your doctor or health care professional if you notice any unusual bleeding. ?Be careful brushing and flossing your teeth or using a toothpick because you may get an infection or bleed more easily. If you have any dental work done, tell your dentist you are receiving this medicine. ?Avoid taking products that contain aspirin, acetaminophen, ibuprofen, naproxen, or ketoprofen unless instructed by your doctor. These medicines may hide a fever. ?Do not become pregnant while taking this medicine. Women should inform their doctor if they wish to become pregnant or  think they might be pregnant. There is a potential for serious side effects to an unborn child. Talk to your health care professional or pharmacist for  more information. Do not breast-feed an infant while taking this medicine. ?Men may have a lower sperm count while taking this medicine. Talk to your doctor if you plan to father a child. ?What side effects may I notice from receiving this medication? ?Side effects that you should report to your doctor or health care professional as soon as possible: ?allergic reactions like skin rash, itching or hives, swelling of the face, lips, or tongue ?low blood counts - This drug may decrease the number of white blood cells, red blood cells and platelets. You may be at increased risk for infections and bleeding. ?signs of infection - fever or chills, cough, sore throat, pain or difficulty passing urine ?signs of decreased platelets or bleeding - bruising, pinpoint red spots on the skin, black, tarry stools, nosebleeds ?signs of decreased red blood cells - unusually weak or tired, fainting spells, lightheadedness ?breathing problems ?changes in hearing ?change in the amount of urine ?chest pain ?high blood pressure ?mouth sores ?nausea and vomiting ?pain, swelling, redness or irritation at the injection site ?pain, tingling, numbness in the hands or feet ?problems with balance, dizziness ?seizures ?Side effects that usually do not require medical attention (report to your doctor or health care professional if they continue or are bothersome): ?constipation ?hair loss ?jaw pain ?loss of appetite ?sensitivity to light ?stomach pain ?tumor pain ?This list may not describe all possible side effects. Call your doctor for medical advice about side effects. You may report side effects to FDA at 1-800-FDA-1088. ?Where should I keep my medication? ?This drug is given in a hospital or clinic and will not be stored at home. ?NOTE: This sheet is a summary. It may not cover all possible information. If you have questions about this medicine, talk to your doctor, pharmacist, or health care provider. ?? 2023 Elsevier/Gold Standard  (2021-10-13 00:00:00) ?Dacarbazine, DTIC injection ?What is this medication? ?DACARBAZINE (da KAR ba zeen) is a chemotherapy drug. This medicine is used to treat skin cancer. It is also used with other medicines to treat Hodgkin's disease. ?This medicine may be used for other purposes; ask your health care provider or pharmacist if you have questions. ?COMMON BRAND NAME(S): DTIC-Dome ?What should I tell my care team before I take this medication? ?They need to know if you have any of these conditions: ?infection (especially virus infection such as chickenpox, cold sores, or herpes) ?kidney disease ?liver disease ?low blood counts like low platelets, red blood cells, white blood cells ?recent radiation therapy ?an unusual or allergic reaction to dacarbazine, other chemotherapy agents, other medicines, foods, dyes, or preservatives ?pregnant or trying to get pregnant ?breast-feeding ?How should I use this medication? ?This drug is given as an injection or infusion into a vein. It is administered in a hospital or clinic by a specially trained health care professional. ?Talk to your pediatrician regarding the use of this medicine in children. While this drug may be prescribed for selected conditions, precautions do apply. ?Overdosage: If you think you have taken too much of this medicine contact a poison control center or emergency room at once. ?NOTE: This medicine is only for you. Do not share this medicine with others. ?What if I miss a dose? ?It is important not to miss your dose. Call your doctor or health care professional if you are unable to keep an appointment. ?  What may interact with this medication? ?medicines to increase blood counts like filgrastim, pegfilgrastim, sargramostim ?vaccines ?This list may not describe all possible interactions. Give your health care provider a list of all the medicines, herbs, non-prescription drugs, or dietary supplements you use. Also tell them if you smoke, drink alcohol, or  use illegal drugs. Some items may interact with your medicine. ?What should I watch for while using this medication? ?Your condition will be monitored carefully while you are receiving this medicine. You will need imp

## 2022-03-27 ENCOUNTER — Other Ambulatory Visit: Payer: Medicare HMO

## 2022-03-29 ENCOUNTER — Telehealth: Payer: Self-pay

## 2022-03-29 ENCOUNTER — Other Ambulatory Visit: Payer: Self-pay | Admitting: Hematology and Oncology

## 2022-03-29 ENCOUNTER — Ambulatory Visit: Payer: Medicare HMO

## 2022-03-29 MED ORDER — CLOTRIMAZOLE 10 MG MT TROC: 10.0000 mg | Freq: Three times a day (TID) | OROMUCOSAL | 0 refills | Status: DC

## 2022-03-29 MED ORDER — NYSTATIN 100000 UNIT/ML MT SUSP: 5.0000 mL | Freq: Four times a day (QID) | OROMUCOSAL | 0 refills | Status: DC

## 2022-03-29 MED ORDER — FLUCONAZOLE 150 MG PO TABS
150.0000 mg | ORAL_TABLET | Freq: Every day | ORAL | 0 refills | Status: DC
Start: 2022-03-29 — End: 2022-07-05

## 2022-03-29 MED ORDER — NYSTATIN 100000 UNIT/ML MT SUSP
5.0000 mL | Freq: Four times a day (QID) | OROMUCOSAL | 0 refills | Status: DC
Start: 2022-03-29 — End: 2022-03-29

## 2022-03-29 NOTE — Telephone Encounter (Addendum)
Pt notified of below. ? ? ?Message ? ? ?Received: Today ?Melodye Ped, NP  Dairl Ponder, RN ?Caller: Unspecified (Today,  9:31 AM) ?I will send in fluconazole and nystatin swish and spit.   ?  ?   ? ? ?Pt called to request something for yeast. She states she has yeast on her gums, tongue and mouth in general. She does not have in [facility]. She also states she thinks she has yeast in her vagina. She states it hurts/burns when she pees (when gets on her skin). She received her last tx was Friday. She uses the [facility] on [address]. Please advise. ?

## 2022-03-31 DIAGNOSIS — C8190 Hodgkin lymphoma, unspecified, unspecified site: Secondary | ICD-10-CM | POA: Diagnosis not present

## 2022-03-31 DIAGNOSIS — Z20822 Contact with and (suspected) exposure to covid-19: Secondary | ICD-10-CM | POA: Diagnosis not present

## 2022-03-31 DIAGNOSIS — K1379 Other lesions of oral mucosa: Secondary | ICD-10-CM | POA: Diagnosis not present

## 2022-03-31 DIAGNOSIS — E860 Dehydration: Secondary | ICD-10-CM | POA: Diagnosis not present

## 2022-03-31 DIAGNOSIS — B370 Candidal stomatitis: Secondary | ICD-10-CM | POA: Diagnosis not present

## 2022-03-31 DIAGNOSIS — D702 Other drug-induced agranulocytosis: Secondary | ICD-10-CM | POA: Diagnosis not present

## 2022-03-31 DIAGNOSIS — D72819 Decreased white blood cell count, unspecified: Secondary | ICD-10-CM | POA: Diagnosis not present

## 2022-03-31 DIAGNOSIS — R1084 Generalized abdominal pain: Secondary | ICD-10-CM | POA: Diagnosis not present

## 2022-03-31 DIAGNOSIS — R5383 Other fatigue: Secondary | ICD-10-CM | POA: Diagnosis not present

## 2022-03-31 DIAGNOSIS — R4182 Altered mental status, unspecified: Secondary | ICD-10-CM | POA: Diagnosis not present

## 2022-04-01 DIAGNOSIS — E860 Dehydration: Secondary | ICD-10-CM | POA: Diagnosis not present

## 2022-04-01 DIAGNOSIS — R109 Unspecified abdominal pain: Secondary | ICD-10-CM | POA: Diagnosis not present

## 2022-04-01 DIAGNOSIS — D72819 Decreased white blood cell count, unspecified: Secondary | ICD-10-CM | POA: Diagnosis not present

## 2022-04-01 DIAGNOSIS — D702 Other drug-induced agranulocytosis: Secondary | ICD-10-CM | POA: Diagnosis not present

## 2022-04-01 DIAGNOSIS — R4182 Altered mental status, unspecified: Secondary | ICD-10-CM | POA: Diagnosis not present

## 2022-04-01 DIAGNOSIS — B370 Candidal stomatitis: Secondary | ICD-10-CM | POA: Diagnosis not present

## 2022-04-01 DIAGNOSIS — R1084 Generalized abdominal pain: Secondary | ICD-10-CM | POA: Diagnosis not present

## 2022-04-01 DIAGNOSIS — N281 Cyst of kidney, acquired: Secondary | ICD-10-CM | POA: Diagnosis not present

## 2022-04-01 DIAGNOSIS — K5730 Diverticulosis of large intestine without perforation or abscess without bleeding: Secondary | ICD-10-CM | POA: Diagnosis not present

## 2022-04-01 DIAGNOSIS — K760 Fatty (change of) liver, not elsewhere classified: Secondary | ICD-10-CM | POA: Diagnosis not present

## 2022-04-02 ENCOUNTER — Other Ambulatory Visit: Payer: Self-pay

## 2022-04-03 ENCOUNTER — Inpatient Hospital Stay (INDEPENDENT_AMBULATORY_CARE_PROVIDER_SITE_OTHER): Payer: Medicare HMO | Admitting: Hematology and Oncology

## 2022-04-03 ENCOUNTER — Other Ambulatory Visit: Payer: Self-pay

## 2022-04-03 ENCOUNTER — Encounter: Payer: Self-pay | Admitting: Hematology and Oncology

## 2022-04-03 DIAGNOSIS — R1084 Generalized abdominal pain: Secondary | ICD-10-CM | POA: Diagnosis not present

## 2022-04-03 DIAGNOSIS — C8118 Nodular sclerosis classical Hodgkin lymphoma, lymph nodes of multiple sites: Secondary | ICD-10-CM | POA: Diagnosis not present

## 2022-04-03 DIAGNOSIS — R109 Unspecified abdominal pain: Secondary | ICD-10-CM | POA: Insufficient documentation

## 2022-04-03 MED ORDER — OXYCODONE HCL 5 MG PO TABS: 5.0000 mg | ORAL_TABLET | ORAL | 0 refills | Status: DC | PRN

## 2022-04-03 NOTE — Assessment & Plan Note (Addendum)
A 76 y.o. female with stage IIIA classical Hodgkin lymphoma, nodular sclerosis subtype. She is currently undergoing treatment with dacarbazine, doxorubicin and vinblastine. She is due Day 15, cycle 3 next week.  ?

## 2022-04-03 NOTE — Assessment & Plan Note (Signed)
Patient has had abdominal pain for the last couple of weeks as well as multiple yeast infections. She was evaluated in the ED and CT imaging did not reveal a cause for acute symptoms. Imaging did reveal resolution of previously identified adenopathy; moderate fecal loading in the cecum and ascending colon. She has not had a bowel movement since Saturday in the ED and that was small. She has no appetite and admits to not eating or drinking well. We discussed that her pain is most likely associated with the constipation noted on CT. I have given her numerous remedies including milk of mag + mineral oil and enema. She states she took one stool softener on Saturday and has not taken anymore since "it didn't seem to help". Our short term plan for her is to take 2 tablespoons of MOM+mineral oil, repeat in 30 minutes if ineffective; fleets enema; twice daily stool softeners until regular bowel movements. She also suffered severe thrush and vaginal yeast and was prescribed diflucan x 5 days. She did not take these as she felt they increased her stomach pain. I have asked her to finish the prescription in an effort to keep her yeast infections from returning. She will keep her scheduled appointment with Dr. Bobby Rumpf this week in hopes of proceeding with treatment next week. Her husband and daughter are present for this visit and I have provided written instructions.  ?

## 2022-04-03 NOTE — Progress Notes (Cosign Needed)
?Patient Care Team: ?Serita Grammes, MD as PCP - General (Family Medicine) ?Marice Potter, MD as Consulting Physician (Hematology and Oncology) ? ?Clinic Day:  04/03/2022 ? ?Referring physician: Serita Grammes, MD ? ?ASSESSMENT & PLAN:  ? ?Assessment & Plan: ?Hodgkin lymphoma of lymph nodes of multiple regions [facility]) ?A 76 y.o. female with stage IIIA classical Hodgkin lymphoma, nodular sclerosis subtype. She is currently undergoing treatment with dacarbazine, doxorubicin and vinblastine. She is due Day 15, cycle 3 next week.  ? ?Abdominal pain ?Patient has had abdominal pain for the last couple of weeks as well as multiple yeast infections. She was evaluated in the ED and CT imaging did not reveal a cause for acute symptoms. Imaging did reveal resolution of previously identified adenopathy; moderate fecal loading in the cecum and ascending colon. She has not had a bowel movement since Saturday in the ED and that was small. She has no appetite and admits to not eating or drinking well. We discussed that her pain is most likely associated with the constipation noted on CT. I have given her numerous remedies including milk of mag + mineral oil and enema. She states she took one stool softener on Saturday and has not taken anymore since "it didn't seem to help". Our short term plan for her is to take 2 tablespoons of MOM+mineral oil, repeat in 30 minutes if ineffective; fleets enema; twice daily stool softeners until regular bowel movements. She also suffered severe thrush and vaginal yeast and was prescribed diflucan x 5 days. She did not take these as she felt they increased her stomach pain. I have asked her to finish the prescription in an effort to keep her yeast infections from returning. She will keep her scheduled appointment with Dr. Bobby Rumpf this week in hopes of proceeding with treatment next week. Her husband and daughter are present for this visit and I have provided written instructions.  ?  ? ?The  patient understands the plans discussed today and is in agreement with them.  She knows to contact our office if she develops concerns prior to her next appointment. ? ? ?Melodye Ped, NP  ?[place] ?[place] ?[place] [address] ?Dept: [phone] ?Dept Fax: [phone]  ? ?No orders of the defined types were placed in this encounter. ?  ? ? ?CHIEF COMPLAINT:  ?CC: A 76 year old female with history of lymphoma here for symptom management; abdominal pain ? ?Current Treatment:  AVD ? ?INTERVAL HISTORY:  ?Julanne is here today for repeat clinical assessment. She denies fevers or chills. She denies pain. Her appetite is good. Her weight has been stable. ? ?I have reviewed the past medical history, past surgical history, social history and family history with the patient and they are unchanged from previous note. ? ?ALLERGIES:  is allergic to penicillins, phenazopyridine hcl, sulfa antibiotics, butorphanol, ciprofloxacin, and levofloxacin. ? ?MEDICATIONS:  ?Current Outpatient Medications  ?Medication Sig Dispense Refill  ? oxyCODONE (OXY IR/ROXICODONE) 5 MG immediate release tablet Take 1 tablet (5 mg total) by mouth every 4 (four) hours as needed for severe pain. 120 tablet 0  ? albuterol (VENTOLIN HFA) 108 (90 BASE) MCG/ACT inhaler Inhale into the lungs every 6 (six) hours as needed for wheezing or shortness of breath. Inhale two puffs every four to six hours as needed.    ? benzonatate (TESSALON) 100 MG capsule Take 1 capsule (100 mg total) by mouth 3 (three) times daily as needed for  cough. 30 capsule 1  ? Blood Glucose Monitoring Suppl ([place]) w/Device KIT CHECK BLOOD SUGAR ONCE A DAY    ? clotrimazole (MYCELEX) 10 MG troche Take by mouth.    ? dexamethasone (DECADRON) 4 MG tablet Take 2 tablets (8 mg total) by mouth daily. Take for 3 days after chemotherapy. Do not take until after brentuximab is completed 12  tablet 5  ? doxycycline (VIBRAMYCIN) 100 MG capsule Take 100 mg by mouth 2 (two) times daily.    ? escitalopram (LEXAPRO) 5 MG tablet Take 5 mg by mouth at bedtime.    ? estradiol (ESTRACE) 0.1 MG/GM vaginal cream Place 1 Applicatorful vaginally at bedtime.    ? famotidine (PEPCID) 20 MG tablet Take 20 mg by mouth 2 (two) times daily.    ? fluconazole (DIFLUCAN) 150 MG tablet Take 1 tablet (150 mg total) by mouth daily. 2 tablet 0  ? fluconazole (DIFLUCAN) 50 MG tablet Take 100 mg by mouth daily.    ? glucose blood (ONETOUCH ULTRA) test strip     ? hydroxychloroquine (PLAQUENIL) 200 MG tablet Take 200 mg by mouth daily.    ? hydroxychloroquine (PLAQUENIL) 200 MG tablet Take 1 tablet by mouth daily.    ? ketorolac (ACULAR) 0.5 % ophthalmic solution SMARTSIG:In Eye(s)    ? Lancets (ONETOUCH DELICA PLUS [id number]) MISC Apply topically 2 (two) times daily.    ? lidocaine (XYLOCAINE) 2 % solution SMARTSIG:15 Milliliter(s) By Mouth Every 3 Hours    ? lidocaine-prilocaine (EMLA) cream Apply to affected area once 30 g 3  ? LORazepam (ATIVAN) 1 MG tablet Take 1 tablet (1 mg total) by mouth every 8 (eight) hours. 30 tablet 0  ? mupirocin ointment (BACTROBAN) 2 % Apply topically.    ? nitrofurantoin, macrocrystal-monohydrate, (MACROBID) 100 MG capsule Take 1 capsule (100 mg total) by mouth 2 (two) times daily. With food 14 capsule 0  ? nystatin (MYCOSTATIN) 100000 UNIT/ML suspension Take 5 mLs (500,000 Units total) by mouth 4 (four) times daily. 473 mL 0  ? omeprazole (PRILOSEC) 40 MG capsule Take by mouth.    ? ondansetron (ZOFRAN) 8 MG tablet Take by mouth.    ? pantoprazole (PROTONIX) 40 MG tablet Take 40 mg by mouth 2 (two) times daily.    ? PERCOCET 5-325 MG tablet SMARTSIG:1 Each By Mouth Every 4 Hours    ? pioglitazone (ACTOS) 30 MG tablet Take by mouth.    ? prochlorperazine (COMPAZINE) 10 MG tablet Take 1 tablet (10 mg total) by mouth every 6 (six) hours as needed (Nausea or vomiting). 30 tablet 1  ? promethazine  (PHENERGAN) 25 MG tablet Take 25 mg by mouth every 8 (eight) hours as needed.    ? promethazine-dextromethorphan (PROMETHAZINE-DM) 6.25-15 MG/5ML syrup TAKE 5 MILLILITER AT BEDTIME AS NEEDED FOR COUGH    ? traMADol (ULTRAM) 50 MG tablet Take 1 tablet (50 mg total) by mouth every 6 (six) hours as needed. 20 tablet 0  ? ?No current facility-administered medications for this visit.  ? ? ?HISTORY OF PRESENT ILLNESS:  ? ?Oncology History  ?Hodgkin lymphoma of lymph nodes of multiple regions [facility])  ?01/18/2022 Initial Diagnosis  ? Hodgkin lymphoma of lymph nodes of multiple regions [facility]) ? ?  ?02/01/2022 -  Chemotherapy  ? Patient is on Treatment Plan :  HODGKINS LYMPHOMA Sequential A + AVD (Brentuximab q21d / Doxorubicin + Vinblastine + Dacarbazine q28d)   ? ?  ?  ?  ? ? ?REVIEW OF SYSTEMS:  ? ?  Constitutional: Denies fevers, chills or abnormal weight loss ?Eyes: Denies blurriness of vision ?Ears, nose, mouth, throat, and face: Denies mucositis or sore throat ?Respiratory: Denies cough, dyspnea or wheezes ?Cardiovascular: Denies palpitation, chest discomfort or lower extremity swelling ?Gastrointestinal:  Denies nausea, heartburn or change in bowel habits ?Skin: Denies abnormal skin rashes ?Lymphatics: Denies new lymphadenopathy or easy bruising ?Neurological:Denies numbness, tingling or new weaknesses ?Behavioral/Psych: Mood is stable, no new changes  ?All other systems were reviewed with the patient and are negative. ? ? ?VITALS:  ?Blood pressure (!) 191/86, pulse (!) 113, temperature 98.1 ?F (36.7 ?C), temperature source Oral, resp. rate 18, height 5' 2.5" (1.588 m), weight 132 lb (59.9 kg), SpO2 95 %.  ?Wt Readings from Last 3 Encounters:  ?04/03/22 132 lb (59.9 kg)  ?03/26/22 134 lb (60.8 kg)  ?03/16/22 139 lb 12.8 oz (63.4 kg)  ?  ?Body mass index is 23.76 kg/m?. ? ?Performance status (ECOG): 1 - Symptomatic but completely ambulatory ? ?PHYSICAL EXAM:  ? ?GENERAL:alert, no distress and comfortable ?SKIN: skin color,  texture, turgor are normal, no rashes or significant lesions ?EYES: normal, Conjunctiva are pink and non-injected, sclera clear ?OROPHARYNX:no exudate, no erythema and lips, buccal mucosa, and tongue normal  ?NECK

## 2022-04-04 ENCOUNTER — Telehealth: Payer: Self-pay | Admitting: Dietician

## 2022-04-04 DIAGNOSIS — R4182 Altered mental status, unspecified: Secondary | ICD-10-CM | POA: Diagnosis not present

## 2022-04-04 DIAGNOSIS — D702 Other drug-induced agranulocytosis: Secondary | ICD-10-CM | POA: Diagnosis not present

## 2022-04-04 DIAGNOSIS — E860 Dehydration: Secondary | ICD-10-CM | POA: Diagnosis not present

## 2022-04-04 DIAGNOSIS — R410 Disorientation, unspecified: Secondary | ICD-10-CM | POA: Diagnosis not present

## 2022-04-04 DIAGNOSIS — D72819 Decreased white blood cell count, unspecified: Secondary | ICD-10-CM | POA: Diagnosis not present

## 2022-04-04 DIAGNOSIS — J9811 Atelectasis: Secondary | ICD-10-CM | POA: Diagnosis not present

## 2022-04-04 DIAGNOSIS — R1084 Generalized abdominal pain: Secondary | ICD-10-CM | POA: Diagnosis not present

## 2022-04-04 DIAGNOSIS — B370 Candidal stomatitis: Secondary | ICD-10-CM | POA: Diagnosis not present

## 2022-04-04 NOTE — Telephone Encounter (Signed)
Nutrition Assessment ? ?Reason for Assessment: MST screen for weight loss and eating poorly.  ? ? ?ASSESSMENT: Patient is 76 year old female with stage IIIA classical Hodgkin lymphoma. She reports having no appetite, nausea, and constipation.  She is too weak to "get around much." ?She isn't using anti nausea meds regularly. She still hasn't moved bowels since taking MOM last night.  She doesn't like the taste of Boost, but does enjoy Body Armour Peach. She seemed very frustrated, and sounded almost tearful with her weight loss and made mention the she wished "God would touch her."  When trying to probe about her intake she said she was nauseous and couldn't talk much. She did say that her chicken soup last night did sit well. ? ? ?Anthropometrics:  Significant weight loss 8# (5.7%) past month ? ?Height: 62.5" ?Weight:  ?04/03/22  132# ?03/08/22  140# ?02/05/22   145# ?UBW: 145-150 ?BMI: 23.76 ? ?INTERVENTION: Encourage strategies for constipation and improving nausea.  Emphasized need to increase fluids, tried to encourage warm liquids she's willing to trial soups. Encourage use of laxatives and anti-emetics. Relayed use of Body Armour as electrolyte replacement and way to improve fluid intake but not a source of energy. Will ask staff to give vanilla Ensure sample and coupons tomorrow.  Will mail tips for constipation and follow up next week. ? ? ?MONITORING, EVALUATION, GOAL:  Weight trends, nutrition impact symptoms, bowel regularity, and PO intake ? ? ?Next Visit: By telephone next week ? ?April Manson, RDN, LDN ?Registered Dietitian, [place] ?Part Time Remote (Usual office hours: Tuesday-Thursday) ?Cell: [phone]   ?

## 2022-04-04 NOTE — Progress Notes (Deleted)
[facility]  [address] [place],  [place]  [address] [phone]  Clinic Day:  03/16/2022  Referring physician: Serita Grammes, MD  HISTORY OF PRESENT ILLNESS:  The patient is a 76 y.o. female with stage IIIA classical Hodgkin lymphoma.  She comes in today to be evaluated before heading into her first cycle of Adriamycin/vinblastine/dacarbazine (AVD) chemotherapy.  She recently completed her 2 cycles of brentuximab vedotin.  She claims to tolerated those 2 cycles very well.  However, she has run into other problems, including a productive cough for yellowish-green phlegm.  The patient claims she was just started on azithromycin by her primary care office yesterday.  She continues to deal with a painful urinary tract infection.  She has been given IV Invanz over these last few days to treat this.  However, dysuria persists.  She also claims her primary care office gave her fluconazole due to having a concurrent yeast infection.  Overall, the patient feels somewhat poorly and is nervous about proceeding forth her first cycle of chemotherapy next week.  As it pertains to her disease, she denies having any B symptoms which concern her for overt signs of disease progression while on treatment.  PHYSICAL EXAM:  There were no vitals taken for this visit. Wt Readings from Last 3 Encounters:  04/03/22 132 lb (59.9 kg)  03/26/22 134 lb (60.8 kg)  03/16/22 139 lb 12.8 oz (63.4 kg)   There is no height or weight on file to calculate BMI. Performance status (ECOG): 1 - Symptomatic but completely ambulatory Physical Exam Constitutional:      Appearance: Normal appearance.     Comments: Very anxious, with pessimistic outlook  HENT:     Mouth/Throat:     Pharynx: Oropharynx is clear. No oropharyngeal exudate.  Cardiovascular:     Rate and Rhythm: Normal rate and regular rhythm.     Heart sounds: No murmur heard.   No friction rub. No gallop.  Pulmonary:      Breath sounds: Normal breath sounds.  Chest:  Breasts:    Right: No swelling, bleeding, inverted nipple, mass, nipple discharge or skin change.     Left: No swelling, bleeding, inverted nipple, mass, nipple discharge or skin change.  Abdominal:     General: Bowel sounds are normal. There is no distension.     Palpations: Abdomen is soft. There is no mass.     Tenderness: There is no abdominal tenderness.  Musculoskeletal:        General: No tenderness.     Cervical back: Normal range of motion and neck supple.     Right lower leg: No edema.     Left lower leg: No edema.  Lymphadenopathy:     Cervical: No cervical adenopathy.     Right cervical: No superficial, deep or posterior cervical adenopathy.    Left cervical: No superficial, deep or posterior cervical adenopathy.     Upper Body:     Right upper body: No supraclavicular or axillary adenopathy.     Left upper body: No supraclavicular or axillary adenopathy.     Lower Body: No right inguinal adenopathy. No left inguinal adenopathy.  Skin:    Coloration: Skin is not jaundiced.     Findings: No lesion or rash.  Neurological:     General: No focal deficit present.     Mental Status: She is alert and oriented to person, place, and time. Mental status is at baseline.  Psychiatric:  Mood and Affect: Mood normal.        Behavior: Behavior normal.        Thought Content: Thought content normal.        Judgment: Judgment normal.   LABS:  Latest Reference Range & Units 03/13/22 00:00  WBC  2.5 (E)  RBC 3.87 - 5.11  3.64 ! (E)  Hemoglobin 12.0 - 16.0  10.8 ! (E)  HCT 36 - 46  34 ! (E)  Platelets 150 - 400 K/uL 169 (E)  NEUT#  0.65 (E)  !: Data is abnormal (E): External lab result  Latest Reference Range & Units 03/13/22 00:00  Sodium 137 - 147  141 (E)  Potassium 3.5 - 5.1 mEq/L 3.6 (E)  Chloride 99 - 108  104 (E)  CO2 13 - 22  26 ! (E)  Glucose  180 (E)  BUN 4 - 21  16 (E)  Creatinine 0.5 - 1.1  0.8 (E)  Calcium  8.7 - 10.7  8.9 (E)  Alkaline Phosphatase 25 - 125  107 (E)  Albumin 3.5 - 5.0  3.8 (E)  AST 13 - 35  43 ! (E)  ALT 7 - 35 U/L 24 (E)  Bilirubin, Total  0.6 (E)  !: Data is abnormal (E): External lab result  ASSESSMENT & PLAN:  Assessment/Plan:  A 76 y.o. female with stage IIIA classical Hodgkin lymphoma, nodular sclerosis subtype.  The plan is for her to proceed with her first cycle of AVD chemotherapy next week.  However, she remains neutropenic despite receiving white cell shot therapy earlier this week.  There is still a chance her white count will improve over these next few days to where it will be high enough for her to proceed with her AVD chemotherapy next week.  I will check her CBC early next week to verify this.  With respect to her productive cough, the patient is already on azithromycin.  As her coughing has been extremely problematic, I will also prescribe her Tessalon Perles.  As her coughing has caused significant musculoskeletal pain, I will also prescribe her tramadol for pain relief.  Most of this visit today was spent trying to reassure the patient and her family that everything will be done to maintain as good of a quality of life as she goes through her chemotherapy.  I am uncertain as to whether she will proceed with her first cycle of AVD early next week.  If she is still coughing and having urinary symptoms, I will likely delay her AVD chemotherapy for 1 week.  Of note, a urine culture will be rechecked today to ensure her IV antibiotics are working and clearing her urinary tract infection.  I will see this patient back in mid May to ensure that she is doing well as she goes through her first few treatments of AVD.  Both the patient and her family understand all the plans discussed today and know to contact our office before her next visit if she runs into any problems that require immediate clinical attention.    Latish Toutant Macarthur Critchley, MD

## 2022-04-05 ENCOUNTER — Ambulatory Visit: Payer: Medicare HMO | Admitting: Oncology

## 2022-04-05 ENCOUNTER — Other Ambulatory Visit: Payer: Medicare HMO

## 2022-04-05 DIAGNOSIS — R1084 Generalized abdominal pain: Secondary | ICD-10-CM | POA: Diagnosis not present

## 2022-04-05 DIAGNOSIS — R4182 Altered mental status, unspecified: Secondary | ICD-10-CM | POA: Diagnosis not present

## 2022-04-05 DIAGNOSIS — N183 Chronic kidney disease, stage 3 unspecified: Secondary | ICD-10-CM | POA: Diagnosis not present

## 2022-04-05 DIAGNOSIS — R000 Tachycardia, unspecified: Secondary | ICD-10-CM | POA: Diagnosis not present

## 2022-04-05 DIAGNOSIS — D631 Anemia in chronic kidney disease: Secondary | ICD-10-CM | POA: Diagnosis not present

## 2022-04-05 DIAGNOSIS — D701 Agranulocytosis secondary to cancer chemotherapy: Secondary | ICD-10-CM | POA: Diagnosis not present

## 2022-04-05 DIAGNOSIS — E785 Hyperlipidemia, unspecified: Secondary | ICD-10-CM | POA: Diagnosis not present

## 2022-04-05 DIAGNOSIS — F419 Anxiety disorder, unspecified: Secondary | ICD-10-CM | POA: Diagnosis not present

## 2022-04-05 DIAGNOSIS — Z79890 Hormone replacement therapy (postmenopausal): Secondary | ICD-10-CM | POA: Diagnosis not present

## 2022-04-05 DIAGNOSIS — J9811 Atelectasis: Secondary | ICD-10-CM | POA: Diagnosis not present

## 2022-04-05 DIAGNOSIS — Z888 Allergy status to other drugs, medicaments and biological substances status: Secondary | ICD-10-CM | POA: Diagnosis not present

## 2022-04-05 DIAGNOSIS — R5081 Fever presenting with conditions classified elsewhere: Secondary | ICD-10-CM | POA: Diagnosis not present

## 2022-04-05 DIAGNOSIS — R5381 Other malaise: Secondary | ICD-10-CM | POA: Diagnosis not present

## 2022-04-05 DIAGNOSIS — J45909 Unspecified asthma, uncomplicated: Secondary | ICD-10-CM | POA: Diagnosis not present

## 2022-04-05 DIAGNOSIS — R410 Disorientation, unspecified: Secondary | ICD-10-CM | POA: Diagnosis not present

## 2022-04-05 DIAGNOSIS — I129 Hypertensive chronic kidney disease with stage 1 through stage 4 chronic kidney disease, or unspecified chronic kidney disease: Secondary | ICD-10-CM | POA: Diagnosis not present

## 2022-04-05 DIAGNOSIS — T451X5A Adverse effect of antineoplastic and immunosuppressive drugs, initial encounter: Secondary | ICD-10-CM | POA: Diagnosis not present

## 2022-04-05 DIAGNOSIS — Z882 Allergy status to sulfonamides status: Secondary | ICD-10-CM | POA: Diagnosis not present

## 2022-04-05 DIAGNOSIS — M1990 Unspecified osteoarthritis, unspecified site: Secondary | ICD-10-CM | POA: Diagnosis not present

## 2022-04-05 DIAGNOSIS — D702 Other drug-induced agranulocytosis: Secondary | ICD-10-CM | POA: Diagnosis not present

## 2022-04-05 DIAGNOSIS — Z880 Allergy status to penicillin: Secondary | ICD-10-CM | POA: Diagnosis not present

## 2022-04-05 DIAGNOSIS — B370 Candidal stomatitis: Secondary | ICD-10-CM | POA: Diagnosis not present

## 2022-04-05 DIAGNOSIS — C8110 Nodular sclerosis classical Hodgkin lymphoma, unspecified site: Secondary | ICD-10-CM | POA: Diagnosis not present

## 2022-04-05 DIAGNOSIS — G9340 Encephalopathy, unspecified: Secondary | ICD-10-CM | POA: Diagnosis not present

## 2022-04-05 DIAGNOSIS — J189 Pneumonia, unspecified organism: Secondary | ICD-10-CM | POA: Diagnosis not present

## 2022-04-05 DIAGNOSIS — Z792 Long term (current) use of antibiotics: Secondary | ICD-10-CM | POA: Diagnosis not present

## 2022-04-05 DIAGNOSIS — D72819 Decreased white blood cell count, unspecified: Secondary | ICD-10-CM | POA: Diagnosis not present

## 2022-04-05 DIAGNOSIS — Z79891 Long term (current) use of opiate analgesic: Secondary | ICD-10-CM | POA: Diagnosis not present

## 2022-04-05 DIAGNOSIS — G9341 Metabolic encephalopathy: Secondary | ICD-10-CM | POA: Diagnosis not present

## 2022-04-05 DIAGNOSIS — E860 Dehydration: Secondary | ICD-10-CM | POA: Diagnosis not present

## 2022-04-05 DIAGNOSIS — D61810 Antineoplastic chemotherapy induced pancytopenia: Secondary | ICD-10-CM | POA: Diagnosis not present

## 2022-04-05 DIAGNOSIS — Z87440 Personal history of urinary (tract) infections: Secondary | ICD-10-CM | POA: Diagnosis not present

## 2022-04-05 DIAGNOSIS — Z885 Allergy status to narcotic agent status: Secondary | ICD-10-CM | POA: Diagnosis not present

## 2022-04-05 DIAGNOSIS — R443 Hallucinations, unspecified: Secondary | ICD-10-CM | POA: Diagnosis not present

## 2022-04-05 DIAGNOSIS — Z881 Allergy status to other antibiotic agents status: Secondary | ICD-10-CM | POA: Diagnosis not present

## 2022-04-06 DIAGNOSIS — G9340 Encephalopathy, unspecified: Secondary | ICD-10-CM | POA: Diagnosis not present

## 2022-04-06 DIAGNOSIS — J189 Pneumonia, unspecified organism: Secondary | ICD-10-CM | POA: Diagnosis not present

## 2022-04-06 DIAGNOSIS — C8110 Nodular sclerosis classical Hodgkin lymphoma, unspecified site: Secondary | ICD-10-CM | POA: Diagnosis not present

## 2022-04-06 DIAGNOSIS — D701 Agranulocytosis secondary to cancer chemotherapy: Secondary | ICD-10-CM | POA: Diagnosis not present

## 2022-04-07 DIAGNOSIS — D701 Agranulocytosis secondary to cancer chemotherapy: Secondary | ICD-10-CM | POA: Diagnosis not present

## 2022-04-07 DIAGNOSIS — C8110 Nodular sclerosis classical Hodgkin lymphoma, unspecified site: Secondary | ICD-10-CM | POA: Diagnosis not present

## 2022-04-07 DIAGNOSIS — G9340 Encephalopathy, unspecified: Secondary | ICD-10-CM | POA: Diagnosis not present

## 2022-04-08 DIAGNOSIS — C8110 Nodular sclerosis classical Hodgkin lymphoma, unspecified site: Secondary | ICD-10-CM | POA: Diagnosis not present

## 2022-04-08 DIAGNOSIS — R000 Tachycardia, unspecified: Secondary | ICD-10-CM | POA: Diagnosis not present

## 2022-04-08 DIAGNOSIS — G9340 Encephalopathy, unspecified: Secondary | ICD-10-CM | POA: Diagnosis not present

## 2022-04-08 DIAGNOSIS — D701 Agranulocytosis secondary to cancer chemotherapy: Secondary | ICD-10-CM | POA: Diagnosis not present

## 2022-04-09 ENCOUNTER — Ambulatory Visit: Payer: Medicare HMO

## 2022-04-09 DIAGNOSIS — C8110 Nodular sclerosis classical Hodgkin lymphoma, unspecified site: Secondary | ICD-10-CM | POA: Diagnosis not present

## 2022-04-09 DIAGNOSIS — G9340 Encephalopathy, unspecified: Secondary | ICD-10-CM | POA: Diagnosis not present

## 2022-04-09 DIAGNOSIS — R4182 Altered mental status, unspecified: Secondary | ICD-10-CM | POA: Diagnosis not present

## 2022-04-09 DIAGNOSIS — D701 Agranulocytosis secondary to cancer chemotherapy: Secondary | ICD-10-CM | POA: Diagnosis not present

## 2022-04-10 DIAGNOSIS — G9340 Encephalopathy, unspecified: Secondary | ICD-10-CM | POA: Diagnosis not present

## 2022-04-10 DIAGNOSIS — D701 Agranulocytosis secondary to cancer chemotherapy: Secondary | ICD-10-CM | POA: Diagnosis not present

## 2022-04-10 DIAGNOSIS — C8110 Nodular sclerosis classical Hodgkin lymphoma, unspecified site: Secondary | ICD-10-CM | POA: Diagnosis not present

## 2022-04-11 ENCOUNTER — Encounter: Payer: Medicare HMO | Admitting: Dietician

## 2022-04-11 ENCOUNTER — Other Ambulatory Visit: Payer: Self-pay | Admitting: Hematology and Oncology

## 2022-04-11 DIAGNOSIS — G9340 Encephalopathy, unspecified: Secondary | ICD-10-CM | POA: Diagnosis not present

## 2022-04-11 DIAGNOSIS — D701 Agranulocytosis secondary to cancer chemotherapy: Secondary | ICD-10-CM | POA: Diagnosis not present

## 2022-04-11 DIAGNOSIS — C8110 Nodular sclerosis classical Hodgkin lymphoma, unspecified site: Secondary | ICD-10-CM | POA: Diagnosis not present

## 2022-04-12 DIAGNOSIS — D631 Anemia in chronic kidney disease: Secondary | ICD-10-CM | POA: Diagnosis not present

## 2022-04-12 DIAGNOSIS — R599 Enlarged lymph nodes, unspecified: Secondary | ICD-10-CM | POA: Diagnosis not present

## 2022-04-12 DIAGNOSIS — I129 Hypertensive chronic kidney disease with stage 1 through stage 4 chronic kidney disease, or unspecified chronic kidney disease: Secondary | ICD-10-CM | POA: Diagnosis not present

## 2022-04-12 DIAGNOSIS — C8590 Non-Hodgkin lymphoma, unspecified, unspecified site: Secondary | ICD-10-CM | POA: Diagnosis not present

## 2022-04-12 DIAGNOSIS — Z79891 Long term (current) use of opiate analgesic: Secondary | ICD-10-CM | POA: Diagnosis not present

## 2022-04-12 DIAGNOSIS — Z8701 Personal history of pneumonia (recurrent): Secondary | ICD-10-CM | POA: Diagnosis not present

## 2022-04-12 DIAGNOSIS — Z87440 Personal history of urinary (tract) infections: Secondary | ICD-10-CM | POA: Diagnosis not present

## 2022-04-12 DIAGNOSIS — F419 Anxiety disorder, unspecified: Secondary | ICD-10-CM | POA: Diagnosis not present

## 2022-04-12 DIAGNOSIS — Z8616 Personal history of COVID-19: Secondary | ICD-10-CM | POA: Diagnosis not present

## 2022-04-12 DIAGNOSIS — J42 Unspecified chronic bronchitis: Secondary | ICD-10-CM | POA: Diagnosis not present

## 2022-04-12 DIAGNOSIS — N183 Chronic kidney disease, stage 3 unspecified: Secondary | ICD-10-CM | POA: Diagnosis not present

## 2022-04-12 DIAGNOSIS — M069 Rheumatoid arthritis, unspecified: Secondary | ICD-10-CM | POA: Diagnosis not present

## 2022-04-12 DIAGNOSIS — M1990 Unspecified osteoarthritis, unspecified site: Secondary | ICD-10-CM | POA: Diagnosis not present

## 2022-04-12 DIAGNOSIS — G9340 Encephalopathy, unspecified: Secondary | ICD-10-CM | POA: Diagnosis not present

## 2022-04-12 DIAGNOSIS — G459 Transient cerebral ischemic attack, unspecified: Secondary | ICD-10-CM | POA: Diagnosis not present

## 2022-04-12 DIAGNOSIS — E785 Hyperlipidemia, unspecified: Secondary | ICD-10-CM | POA: Diagnosis not present

## 2022-04-12 DIAGNOSIS — Z951 Presence of aortocoronary bypass graft: Secondary | ICD-10-CM | POA: Diagnosis not present

## 2022-04-12 DIAGNOSIS — J45909 Unspecified asthma, uncomplicated: Secondary | ICD-10-CM | POA: Diagnosis not present

## 2022-04-13 ENCOUNTER — Encounter: Payer: Self-pay | Admitting: Internal Medicine

## 2022-04-19 ENCOUNTER — Inpatient Hospital Stay: Payer: Medicare HMO | Admitting: Dietician

## 2022-04-19 ENCOUNTER — Inpatient Hospital Stay: Payer: Medicare HMO

## 2022-04-19 ENCOUNTER — Encounter: Payer: Self-pay | Admitting: Oncology

## 2022-04-19 DIAGNOSIS — C8118 Nodular sclerosis classical Hodgkin lymphoma, lymph nodes of multiple sites: Secondary | ICD-10-CM

## 2022-04-19 DIAGNOSIS — C8198 Hodgkin lymphoma, unspecified, lymph nodes of multiple sites: Secondary | ICD-10-CM | POA: Diagnosis not present

## 2022-04-19 DIAGNOSIS — D649 Anemia, unspecified: Secondary | ICD-10-CM | POA: Diagnosis not present

## 2022-04-19 LAB — CBC: RBC: 3.28 — AB (ref 3.87–5.11)

## 2022-04-19 LAB — HEPATIC FUNCTION PANEL
ALT: 19 U/L (ref 7–35)
AST: 31 (ref 13–35)
Alkaline Phosphatase: 80 (ref 25–125)
Bilirubin, Total: 0.6

## 2022-04-19 LAB — BASIC METABOLIC PANEL
BUN: 15 (ref 4–21)
CO2: 29 — AB (ref 13–22)
Chloride: 100 (ref 99–108)
Creatinine: 0.8 (ref 0.5–1.1)
Glucose: 119
Potassium: 4.6 mEq/L (ref 3.5–5.1)
Sodium: 139 (ref 137–147)

## 2022-04-19 LAB — CBC AND DIFFERENTIAL
HCT: 31 — AB (ref 36–46)
Hemoglobin: 10.3 — AB (ref 12.0–16.0)
Neutrophils Absolute: 2.48
Platelets: 197 10*3/uL (ref 150–400)
WBC: 3.6

## 2022-04-19 LAB — COMPREHENSIVE METABOLIC PANEL
Albumin: 4.2 (ref 3.5–5.0)
Calcium: 9.5 (ref 8.7–10.7)

## 2022-04-19 NOTE — Progress Notes (Addendum)
Attempted to reach patient for a follow up which was rescheduled as she was in [facility]. There was no answer on home # which is the same as cell.  Will continue trying to reach.    Patient showed up at cancer center for appointment and was very upset that she didn't know it was a virtual follow up.  She asked scheduler that future nutrition consults be canceled.  April Manson, RDN, LDN Registered Dietitian, [place] Part Time Remote (Usual office hours: Tuesday-Thursday) Cell: [phone]

## 2022-04-19 NOTE — Progress Notes (Signed)
[facility]  [address] [place],  [place]  [address] [phone]  Clinic Day:  04/20/2022  Referring physician: Serita Grammes, MD  HISTORY OF PRESENT ILLNESS:  The patient is a 76 y.o. female with stage IIIA classical Hodgkin lymphoma.  She comes in today to be evaluated before heading into her cycle 3, day 15 of Adriamycin/vinblastine/dacarbazine (AVD) chemotherapy.  She recently completed her initial 2 cycles of brentuximab vedotin.  Unfortunately, after her first treatment of AVD, the patient landed in the hospital with altered mental status as well as neutropenia.  There were talks about putting her on multiple IV antibiotics, for which the patient and her family declined due to her previous problems with medications.  Fortunately, her altered mental status gradually improved, as did her white count.  She comes in today extremely nervous about proceeding with additional cycles of AVD chemotherapy.  She really does not know what led to her altered mental status.  Overall, she is very concerned about how well she will do moving forward with additional cycles of AVD chemotherapy.   As it pertains to her disease, she denies having any B symptoms which concern her for overt signs of disease progression while on treatment.  PHYSICAL EXAM:  Blood pressure (!) 172/77, pulse (!) 104, temperature 98 F (36.7 C), resp. rate 14, height 5' 2.5" (1.588 m), weight 130 lb (59 kg), SpO2 98 %. Wt Readings from Last 3 Encounters:  04/27/22 128 lb (58.1 kg)  04/20/22 130 lb (59 kg)  04/03/22 132 lb (59.9 kg)   Body mass index is 23.4 kg/m. Performance status (ECOG): 1 - Symptomatic but completely ambulatory Physical Exam Constitutional:      Appearance: Normal appearance.     Comments: Very anxious, with pessimistic outlook  HENT:     Mouth/Throat:     Pharynx: Oropharynx is clear. No oropharyngeal exudate.  Cardiovascular:     Rate and Rhythm: Normal rate and  regular rhythm.     Heart sounds: No murmur heard.   No friction rub. No gallop.  Pulmonary:     Breath sounds: Normal breath sounds.  Chest:  Breasts:    Right: No swelling, bleeding, inverted nipple, mass, nipple discharge or skin change.     Left: No swelling, bleeding, inverted nipple, mass, nipple discharge or skin change.  Abdominal:     General: Bowel sounds are normal. There is no distension.     Palpations: Abdomen is soft. There is no mass.     Tenderness: There is no abdominal tenderness.  Musculoskeletal:        General: No tenderness.     Cervical back: Normal range of motion and neck supple.     Right lower leg: No edema.     Left lower leg: No edema.  Lymphadenopathy:     Cervical: No cervical adenopathy.     Right cervical: No superficial, deep or posterior cervical adenopathy.    Left cervical: No superficial, deep or posterior cervical adenopathy.     Upper Body:     Right upper body: No supraclavicular or axillary adenopathy.     Left upper body: No supraclavicular or axillary adenopathy.     Lower Body: No right inguinal adenopathy. No left inguinal adenopathy.  Skin:    Coloration: Skin is not jaundiced.     Findings: No lesion or rash.  Neurological:     General: No focal deficit present.     Mental Status: She is alert  and oriented to person, place, and time. Mental status is at baseline.  Psychiatric:        Mood and Affect: Mood normal.        Behavior: Behavior normal.        Thought Content: Thought content normal.        Judgment: Judgment normal.   LABS:  Latest Reference Range & Units 04/19/22 00:00  Sodium 137 - 147  139 (E)  Potassium 3.5 - 5.1 mEq/L 4.6 (E)  Chloride 99 - 108  100 (E)  CO2 13 - 22  29 ! (E)  Glucose  119 (E)  BUN 4 - 21  15 (E)  Creatinine 0.5 - 1.1  0.8 (E)  Calcium 8.7 - 10.7  9.5 (E)  Alkaline Phosphatase 25 - 125  80 (E)  Albumin 3.5 - 5.0  4.2 (E)  AST 13 - 35  31 (E)  ALT 7 - 35 U/L 19 (E)  Bilirubin, Total   0.6 (E)  WBC  3.6 (E)  RBC 3.87 - 5.11  3.28 ! (E)  Hemoglobin 12.0 - 16.0  10.3 ! (E)  HCT 36 - 46  31 ! (E)  Platelets 150 - 400 K/uL 197 (E)  NEUT#  2.48 (E)  !: Data is abnormal (E): External lab result  ASSESSMENT & PLAN:  Assessment/Plan:  A 76 y.o. female with stage IIIA classical Hodgkin lymphoma, nodular sclerosis subtype.  As mentioned previously, the patient is extremely nervous about proceeding with further treatments of AVD.  After a long discussion, the decision was made to decrease her chemotherapy doses by 25% to see if this will enable her to tolerate her additional AVD treatments better.  I will also arrange for her to receive white cell shot therapy to prevent severe neutropenia from impacting her health or delaying successive cycles of treatment.  I strongly encouraged the patient and her family to notify our office over these next few weeks if she runs into any problems that require immediate clinical attention.  Otherwise, I will see her back in 2 weeks before she has into cycle 4, day 1 of AVD chemotherapy.  Both the patient and her family understand all the plans discussed today and are in agreement with them.  [place] Rollyson Macarthur Critchley, MD

## 2022-04-20 ENCOUNTER — Inpatient Hospital Stay: Payer: Medicare HMO | Admitting: Oncology

## 2022-04-20 ENCOUNTER — Encounter: Payer: Self-pay | Admitting: Oncology

## 2022-04-20 ENCOUNTER — Inpatient Hospital Stay: Payer: Medicare HMO

## 2022-04-20 VITALS — BP 172/77 | HR 104 | Temp 98.0°F | Resp 14 | Ht 62.5 in | Wt 130.0 lb

## 2022-04-20 DIAGNOSIS — Z5111 Encounter for antineoplastic chemotherapy: Secondary | ICD-10-CM | POA: Diagnosis not present

## 2022-04-20 DIAGNOSIS — C8118 Nodular sclerosis classical Hodgkin lymphoma, lymph nodes of multiple sites: Secondary | ICD-10-CM | POA: Diagnosis not present

## 2022-04-20 DIAGNOSIS — Z79899 Other long term (current) drug therapy: Secondary | ICD-10-CM | POA: Diagnosis not present

## 2022-04-20 LAB — URINALYSIS, COMPLETE (UACMP) WITH MICROSCOPIC
Bilirubin Urine: NEGATIVE
Glucose, UA: NEGATIVE mg/dL
Hgb urine dipstick: NEGATIVE
Ketones, ur: NEGATIVE mg/dL
Nitrite: NEGATIVE
Protein, ur: NEGATIVE mg/dL
Specific Gravity, Urine: 1.012 (ref 1.005–1.030)
pH: 7 (ref 5.0–8.0)

## 2022-04-25 ENCOUNTER — Encounter: Payer: Medicare HMO | Admitting: Dietician

## 2022-04-26 MED FILL — Dacarbazine For Inj 200 MG: INTRAVENOUS | Qty: 47 | Status: AC

## 2022-04-26 MED FILL — Vinblastine Sulfate Inj 1 MG/ML: INTRAVENOUS | Qty: 7.5 | Status: AC

## 2022-04-26 MED FILL — Fosaprepitant Dimeglumine For IV Infusion 150 MG (Base Eq): INTRAVENOUS | Qty: 5 | Status: AC

## 2022-04-26 MED FILL — Doxorubicin HCl Inj 2 MG/ML: INTRAVENOUS | Qty: 16 | Status: AC

## 2022-04-26 NOTE — Progress Notes (Unsigned)
E did

## 2022-04-27 ENCOUNTER — Encounter: Payer: Self-pay | Admitting: Oncology

## 2022-04-27 ENCOUNTER — Other Ambulatory Visit: Payer: Self-pay

## 2022-04-27 ENCOUNTER — Inpatient Hospital Stay: Payer: Medicare HMO | Attending: Oncology

## 2022-04-27 VITALS — BP 171/72 | HR 99 | Temp 98.2°F | Resp 18 | Wt 128.0 lb

## 2022-04-27 DIAGNOSIS — Z79899 Other long term (current) drug therapy: Secondary | ICD-10-CM | POA: Diagnosis not present

## 2022-04-27 DIAGNOSIS — C8118 Nodular sclerosis classical Hodgkin lymphoma, lymph nodes of multiple sites: Secondary | ICD-10-CM

## 2022-04-27 DIAGNOSIS — Z5189 Encounter for other specified aftercare: Secondary | ICD-10-CM | POA: Insufficient documentation

## 2022-04-27 DIAGNOSIS — Z5111 Encounter for antineoplastic chemotherapy: Secondary | ICD-10-CM | POA: Insufficient documentation

## 2022-04-27 MED ORDER — VINBLASTINE SULFATE CHEMO INJECTION 1 MG/ML
4.5000 mg/m2 | Freq: Once | INTRAVENOUS | Status: AC
  Administered 2022-04-27: 7.5 mg via INTRAVENOUS
  Filled 2022-04-27: qty 7.5

## 2022-04-27 MED ORDER — DEXAMETHASONE SODIUM PHOSPHATE 10 MG/ML IJ SOLN
4.0000 mg | Freq: Once | INTRAMUSCULAR | Status: AC
  Administered 2022-04-27: 4 mg via INTRAVENOUS
  Filled 2022-04-27: qty 1

## 2022-04-27 MED ORDER — DOXORUBICIN HCL CHEMO IV INJECTION 2 MG/ML
18.7500 mg/m2 | Freq: Once | INTRAVENOUS | Status: AC
  Administered 2022-04-27: 32 mg via INTRAVENOUS
  Filled 2022-04-27: qty 16

## 2022-04-27 MED ORDER — PALONOSETRON HCL INJECTION 0.25 MG/5ML
0.2500 mg | Freq: Once | INTRAVENOUS | Status: AC
  Administered 2022-04-27: 0.25 mg via INTRAVENOUS
  Filled 2022-04-27: qty 5

## 2022-04-27 MED ORDER — SODIUM CHLORIDE 0.9 % IV SOLN
281.2500 mg/m2 | Freq: Once | INTRAVENOUS | Status: AC
  Administered 2022-04-27: 470 mg via INTRAVENOUS
  Filled 2022-04-27: qty 47

## 2022-04-27 MED ORDER — SODIUM CHLORIDE 0.9 % IV SOLN: Freq: Once | INTRAVENOUS | Status: AC

## 2022-04-27 MED ORDER — SODIUM CHLORIDE 0.9 % IV SOLN
150.0000 mg | Freq: Once | INTRAVENOUS | Status: AC
  Administered 2022-04-27: 150 mg via INTRAVENOUS
  Filled 2022-04-27: qty 150

## 2022-04-27 NOTE — Patient Instructions (Signed)
Vinblastine injection What is this medication? VINBLASTINE (vin BLAS teen) is a chemotherapy drug. It slows the growth of cancer cells. This medicine is used to treat many types of cancer like breast cancer, testicular cancer, Hodgkin's disease, non-Hodgkin's lymphoma, and sarcoma. This medicine may be used for other purposes; ask your health care provider or pharmacist if you have questions. COMMON BRAND NAME(S): Velban What should I tell my care team before I take this medication? They need to know if you have any of these conditions: blood disorders dental disease gout infection (especially a virus infection such as chickenpox, cold sores, or herpes) liver disease lung disease nervous system disease recent or ongoing radiation therapy an unusual or allergic reaction to vinblastine, other chemotherapy agents, other medicines, foods, dyes, or preservatives pregnant or trying to get pregnant breast-feeding How should I use this medication? This drug is given as an infusion into a vein. It is administered in a hospital or clinic by a specially trained health care professional. If you have pain, swelling, burning or any unusual feeling around the site of your injection, tell your health care professional right away. Talk to your pediatrician regarding the use of this medicine in children. While this drug may be prescribed for selected conditions, precautions do apply. Overdosage: If you think you have taken too much of this medicine contact a poison control center or emergency room at once. NOTE: This medicine is only for you. Do not share this medicine with others. What if I miss a dose? It is important not to miss your dose. Call your doctor or health care professional if you are unable to keep an appointment. What may interact with this medication? erythromycin certain medicines for fungal infections like itraconazole, ketoconazole, posaconazole, voriconazole certain medicines for  seizures like phenytoin This list may not describe all possible interactions. Give your health care provider a list of all the medicines, herbs, non-prescription drugs, or dietary supplements you use. Also tell them if you smoke, drink alcohol, or use illegal drugs. Some items may interact with your medicine. What should I watch for while using this medication? Your condition will be monitored carefully while you are receiving this medicine. You will need important blood work done while you are taking this medicine. This drug may make you feel generally unwell. This is not uncommon, as chemotherapy can affect healthy cells as well as cancer cells. Report any side effects. Continue your course of treatment even though you feel ill unless your doctor tells you to stop. In some cases, you may be given additional medicines to help with side effects. Follow all directions for their use. Call your doctor or health care professional for advice if you get a fever, chills or sore throat, or other symptoms of a cold or flu. Do not treat yourself. This drug decreases your body's ability to fight infections. Try to avoid being around people who are sick. This medicine may increase your risk to bruise or bleed. Call your doctor or health care professional if you notice any unusual bleeding. Be careful brushing and flossing your teeth or using a toothpick because you may get an infection or bleed more easily. If you have any dental work done, tell your dentist you are receiving this medicine. Avoid taking products that contain aspirin, acetaminophen, ibuprofen, naproxen, or ketoprofen unless instructed by your doctor. These medicines may hide a fever. Do not become pregnant while taking this medicine. Women should inform their doctor if they wish to become pregnant or  think they might be pregnant. There is a potential for serious side effects to an unborn child. Talk to your health care professional or pharmacist for  more information. Do not breast-feed an infant while taking this medicine. Men may have a lower sperm count while taking this medicine. Talk to your doctor if you plan to father a child. What side effects may I notice from receiving this medication? Side effects that you should report to your doctor or health care professional as soon as possible: allergic reactions like skin rash, itching or hives, swelling of the face, lips, or tongue low blood counts - This drug may decrease the number of white blood cells, red blood cells and platelets. You may be at increased risk for infections and bleeding. signs of infection - fever or chills, cough, sore throat, pain or difficulty passing urine signs of decreased platelets or bleeding - bruising, pinpoint red spots on the skin, black, tarry stools, nosebleeds signs of decreased red blood cells - unusually weak or tired, fainting spells, lightheadedness breathing problems changes in hearing change in the amount of urine chest pain high blood pressure mouth sores nausea and vomiting pain, swelling, redness or irritation at the injection site pain, tingling, numbness in the hands or feet problems with balance, dizziness seizures Side effects that usually do not require medical attention (report to your doctor or health care professional if they continue or are bothersome): constipation hair loss jaw pain loss of appetite sensitivity to light stomach pain tumor pain This list may not describe all possible side effects. Call your doctor for medical advice about side effects. You may report side effects to FDA at 1-800-FDA-1088. Where should I keep my medication? This drug is given in a hospital or clinic and will not be stored at home. NOTE: This sheet is a summary. It may not cover all possible information. If you have questions about this medicine, talk to your doctor, pharmacist, or health care provider.  2023 Elsevier/Gold Standard  (2021-10-13 00:00:00) Dacarbazine, DTIC injection What is this medication? DACARBAZINE (da KAR ba zeen) is a chemotherapy drug. This medicine is used to treat skin cancer. It is also used with other medicines to treat Hodgkin's disease. This medicine may be used for other purposes; ask your health care provider or pharmacist if you have questions. COMMON BRAND NAME(S): DTIC-Dome What should I tell my care team before I take this medication? They need to know if you have any of these conditions: infection (especially virus infection such as chickenpox, cold sores, or herpes) kidney disease liver disease low blood counts like low platelets, red blood cells, white blood cells recent radiation therapy an unusual or allergic reaction to dacarbazine, other chemotherapy agents, other medicines, foods, dyes, or preservatives pregnant or trying to get pregnant breast-feeding How should I use this medication? This drug is given as an injection or infusion into a vein. It is administered in a hospital or clinic by a specially trained health care professional. Talk to your pediatrician regarding the use of this medicine in children. While this drug may be prescribed for selected conditions, precautions do apply. Overdosage: If you think you have taken too much of this medicine contact a poison control center or emergency room at once. NOTE: This medicine is only for you. Do not share this medicine with others. What if I miss a dose? It is important not to miss your dose. Call your doctor or health care professional if you are unable to keep an appointment.  What may interact with this medication? medicines to increase blood counts like filgrastim, pegfilgrastim, sargramostim vaccines This list may not describe all possible interactions. Give your health care provider a list of all the medicines, herbs, non-prescription drugs, or dietary supplements you use. Also tell them if you smoke, drink alcohol, or  use illegal drugs. Some items may interact with your medicine. What should I watch for while using this medication? Your condition will be monitored carefully while you are receiving this medicine. You will need important blood work done while you are taking this medicine. This drug may make you feel generally unwell. This is not uncommon, as chemotherapy can affect healthy cells as well as cancer cells. Report any side effects. Continue your course of treatment even though you feel ill unless your doctor tells you to stop. Call your doctor or health care professional for advice if you get a fever, chills or sore throat, or other symptoms of a cold or flu. Do not treat yourself. This drug decreases your body's ability to fight infections. Try to avoid being around people who are sick. This medicine may increase your risk to bruise or bleed. Call your doctor or health care professional if you notice any unusual bleeding. Talk to your doctor about your risk of cancer. You may be more at risk for certain types of cancers if you take this medicine. Do not become pregnant while taking this medicine. Women should inform their doctor if they wish to become pregnant or think they might be pregnant. There is a potential for serious side effects to an unborn child. Talk to your health care professional or pharmacist for more information. Do not breast-feed an infant while taking this medicine. What side effects may I notice from receiving this medication? Side effects that you should report to your doctor or health care professional as soon as possible: allergic reactions like skin rash, itching or hives, swelling of the face, lips, or tongue low blood counts - this medicine may decrease the number of white blood cells, red blood cells and platelets. You may be at increased risk for infections and bleeding. signs of infection - fever or chills, cough, sore throat, pain or difficulty passing urine signs of  decreased platelets or bleeding - bruising, pinpoint red spots on the skin, black, tarry stools, blood in the urine signs of decreased red blood cells - unusually weak or tired, fainting spells, lightheadedness breathing problems muscle pains pain at site where injected trouble passing urine or change in the amount of urine vomiting yellowing of the eyes or skin Side effects that usually do not require medical attention (report to your doctor or health care professional if they continue or are bothersome): diarrhea hair loss loss of appetite nausea skin more sensitive to sun or ultraviolet light stomach upset This list may not describe all possible side effects. Call your doctor for medical advice about side effects. You may report side effects to FDA at 1-800-FDA-1088. Where should I keep my medication? This drug is given in a hospital or clinic and will not be stored at home. NOTE: This sheet is a summary. It may not cover all possible information. If you have questions about this medicine, talk to your doctor, pharmacist, or health care provider.  2023 Elsevier/Gold Standard (2016-05-30 00:00:00) Doxorubicin injection What is this medication? DOXORUBICIN (dox oh ROO bi sin) is a chemotherapy drug. It is used to treat many kinds of cancer like leukemia, lymphoma, neuroblastoma, sarcoma, and Wilms' tumor. It  is also used to treat bladder cancer, breast cancer, lung cancer, ovarian cancer, stomach cancer, and thyroid cancer. This medicine may be used for other purposes; ask your health care provider or pharmacist if you have questions. COMMON BRAND NAME(S): Adriamycin, Adriamycin PFS, Adriamycin RDF, Rubex What should I tell my care team before I take this medication? They need to know if you have any of these conditions: heart disease history of low blood counts caused by a medicine liver disease recent or ongoing radiation therapy an unusual or allergic reaction to doxorubicin,  other chemotherapy agents, other medicines, foods, dyes, or preservatives pregnant or trying to get pregnant breast-feeding How should I use this medication? This drug is given as an infusion into a vein. It is administered in a hospital or clinic by a specially trained health care professional. If you have pain, swelling, burning or any unusual feeling around the site of your injection, tell your health care professional right away. Talk to your pediatrician regarding the use of this medicine in children. Special care may be needed. Overdosage: If you think you have taken too much of this medicine contact a poison control center or emergency room at once. NOTE: This medicine is only for you. Do not share this medicine with others. What if I miss a dose? It is important not to miss your dose. Call your doctor or health care professional if you are unable to keep an appointment. What may interact with this medication? This medicine may interact with the following medications: 6-mercaptopurine paclitaxel phenytoin St. John's Wort trastuzumab verapamil This list may not describe all possible interactions. Give your health care provider a list of all the medicines, herbs, non-prescription drugs, or dietary supplements you use. Also tell them if you smoke, drink alcohol, or use illegal drugs. Some items may interact with your medicine. What should I watch for while using this medication? This drug may make you feel generally unwell. This is not uncommon, as chemotherapy can affect healthy cells as well as cancer cells. Report any side effects. Continue your course of treatment even though you feel ill unless your doctor tells you to stop. There is a maximum amount of this medicine you should receive throughout your life. The amount depends on the medical condition being treated and your overall health. Your doctor will watch how much of this medicine you receive in your lifetime. Tell your doctor if  you have taken this medicine before. You may need blood work done while you are taking this medicine. Your urine may turn red for a few days after your dose. This is not blood. If your urine is dark or brown, call your doctor. In some cases, you may be given additional medicines to help with side effects. Follow all directions for their use. Call your doctor or health care professional for advice if you get a fever, chills or sore throat, or other symptoms of a cold or flu. Do not treat yourself. This drug decreases your body's ability to fight infections. Try to avoid being around people who are sick. This medicine may increase your risk to bruise or bleed. Call your doctor or health care professional if you notice any unusual bleeding. Talk to your doctor about your risk of cancer. You may be more at risk for certain types of cancers if you take this medicine. Do not become pregnant while taking this medicine or for 6 months after stopping it. Women should inform their doctor if they wish to become pregnant  or think they might be pregnant. Men should not father a child while taking this medicine and for 6 months after stopping it. There is a potential for serious side effects to an unborn child. Talk to your health care professional or pharmacist for more information. Do not breast-feed an infant while taking this medicine. This medicine has caused ovarian failure in some women and reduced sperm counts in some men This medicine may interfere with the ability to have a child. Talk with your doctor or health care professional if you are concerned about your fertility. This medicine may cause a decrease in Co-Enzyme Q-10. You should make sure that you get enough Co-Enzyme Q-10 while you are taking this medicine. Discuss the foods you eat and the vitamins you take with your health care professional. What side effects may I notice from receiving this medication? Side effects that you should report to your  doctor or health care professional as soon as possible: allergic reactions like skin rash, itching or hives, swelling of the face, lips, or tongue breathing problems chest pain fast or irregular heartbeat low blood counts - this medicine may decrease the number of white blood cells, red blood cells and platelets. You may be at increased risk for infections and bleeding. pain, redness, or irritation at site where injected signs of infection - fever or chills, cough, sore throat, pain or difficulty passing urine signs of decreased platelets or bleeding - bruising, pinpoint red spots on the skin, black, tarry stools, blood in the urine swelling of the ankles, feet, hands tiredness weakness Side effects that usually do not require medical attention (report to your doctor or health care professional if they continue or are bothersome): diarrhea hair loss mouth sores nail discoloration or damage nausea red colored urine vomiting This list may not describe all possible side effects. Call your doctor for medical advice about side effects. You may report side effects to FDA at 1-800-FDA-1088. Where should I keep my medication? This drug is given in a hospital or clinic and will not be stored at home. NOTE: This sheet is a summary. It may not cover all possible information. If you have questions about this medicine, talk to your doctor, pharmacist, or health care provider.  2023 Elsevier/Gold Standard (2017-07-18 00:00:00)

## 2022-04-29 ENCOUNTER — Encounter: Payer: Self-pay | Admitting: Oncology

## 2022-04-30 ENCOUNTER — Ambulatory Visit: Payer: Medicare HMO

## 2022-04-30 ENCOUNTER — Encounter: Payer: Self-pay | Admitting: Oncology

## 2022-04-30 ENCOUNTER — Inpatient Hospital Stay: Payer: Medicare HMO

## 2022-04-30 VITALS — BP 146/59 | HR 98 | Temp 98.1°F | Resp 18 | Ht 62.5 in | Wt 124.8 lb

## 2022-04-30 DIAGNOSIS — C8118 Nodular sclerosis classical Hodgkin lymphoma, lymph nodes of multiple sites: Secondary | ICD-10-CM

## 2022-04-30 DIAGNOSIS — Z5189 Encounter for other specified aftercare: Secondary | ICD-10-CM | POA: Diagnosis not present

## 2022-04-30 DIAGNOSIS — Z5111 Encounter for antineoplastic chemotherapy: Secondary | ICD-10-CM | POA: Diagnosis not present

## 2022-04-30 DIAGNOSIS — Z79899 Other long term (current) drug therapy: Secondary | ICD-10-CM | POA: Diagnosis not present

## 2022-04-30 MED ORDER — PEGFILGRASTIM-JMDB 6 MG/0.6ML ~~LOC~~ SOSY
6.0000 mg | PREFILLED_SYRINGE | Freq: Once | SUBCUTANEOUS | Status: AC
  Administered 2022-04-30: 6 mg via SUBCUTANEOUS

## 2022-04-30 NOTE — Patient Instructions (Signed)

## 2022-05-03 DIAGNOSIS — R300 Dysuria: Secondary | ICD-10-CM | POA: Diagnosis not present

## 2022-05-03 DIAGNOSIS — N1831 Chronic kidney disease, stage 3a: Secondary | ICD-10-CM | POA: Diagnosis not present

## 2022-05-03 DIAGNOSIS — J329 Chronic sinusitis, unspecified: Secondary | ICD-10-CM | POA: Diagnosis not present

## 2022-05-03 DIAGNOSIS — Z6822 Body mass index (BMI) 22.0-22.9, adult: Secondary | ICD-10-CM | POA: Diagnosis not present

## 2022-05-03 DIAGNOSIS — J40 Bronchitis, not specified as acute or chronic: Secondary | ICD-10-CM | POA: Diagnosis not present

## 2022-05-03 DIAGNOSIS — R053 Chronic cough: Secondary | ICD-10-CM | POA: Diagnosis not present

## 2022-05-10 ENCOUNTER — Other Ambulatory Visit: Payer: Self-pay

## 2022-05-10 ENCOUNTER — Other Ambulatory Visit: Payer: Self-pay | Admitting: Oncology

## 2022-05-10 ENCOUNTER — Inpatient Hospital Stay (INDEPENDENT_AMBULATORY_CARE_PROVIDER_SITE_OTHER): Payer: Medicare HMO | Admitting: Oncology

## 2022-05-10 ENCOUNTER — Telehealth: Payer: Self-pay | Admitting: Oncology

## 2022-05-10 ENCOUNTER — Other Ambulatory Visit: Payer: Self-pay | Admitting: Hematology and Oncology

## 2022-05-10 ENCOUNTER — Inpatient Hospital Stay: Payer: Medicare HMO

## 2022-05-10 VITALS — BP 182/74 | HR 107 | Temp 98.0°F | Resp 14 | Ht 62.5 in | Wt 129.9 lb

## 2022-05-10 DIAGNOSIS — C8118 Nodular sclerosis classical Hodgkin lymphoma, lymph nodes of multiple sites: Secondary | ICD-10-CM

## 2022-05-10 DIAGNOSIS — R058 Other specified cough: Secondary | ICD-10-CM

## 2022-05-10 LAB — HEPATIC FUNCTION PANEL
ALT: 16 U/L (ref 7–35)
AST: 26 (ref 13–35)
Alkaline Phosphatase: 101 (ref 25–125)
Bilirubin, Total: 0.4

## 2022-05-10 LAB — CBC AND DIFFERENTIAL
HCT: 28 — AB (ref 36–46)
Hemoglobin: 9.2 — AB (ref 12.0–16.0)
Neutrophils Absolute: 5.85
Platelets: 107 10*3/uL — AB (ref 150–400)
WBC: 7.5

## 2022-05-10 LAB — BASIC METABOLIC PANEL
BUN: 14 (ref 4–21)
CO2: 26 — AB (ref 13–22)
Chloride: 105 (ref 99–108)
Creatinine: 0.9 (ref 0.5–1.1)
Glucose: 103
Potassium: 4.4 mEq/L (ref 3.5–5.1)
Sodium: 140 (ref 137–147)

## 2022-05-10 LAB — COMPREHENSIVE METABOLIC PANEL
Albumin: 4.1 (ref 3.5–5.0)
Calcium: 8.8 (ref 8.7–10.7)

## 2022-05-10 LAB — CBC: RBC: 2.76 — AB (ref 3.87–5.11)

## 2022-05-10 MED FILL — Doxorubicin HCl Inj 2 MG/ML: INTRAVENOUS | Qty: 16 | Status: AC

## 2022-05-10 MED FILL — Dacarbazine For Inj 200 MG: INTRAVENOUS | Qty: 47 | Status: AC

## 2022-05-10 MED FILL — Fosaprepitant Dimeglumine For IV Infusion 150 MG (Base Eq): INTRAVENOUS | Qty: 5 | Status: AC

## 2022-05-10 MED FILL — Vinblastine Sulfate Inj 1 MG/ML: INTRAVENOUS | Qty: 7.5 | Status: AC

## 2022-05-10 NOTE — Telephone Encounter (Signed)
Per 05/10/22 los next appt scheduled and confirmed with patient

## 2022-05-10 NOTE — Progress Notes (Signed)
[facility]  [address] [place],  [place]  [address] [phone]  Clinic Day:  05/10/2022  Referring physician: Serita Grammes, MD  HISTORY OF PRESENT ILLNESS:  The patient is a 76 y.o. female with stage IIIA classical Hodgkin lymphoma.  She comes in today to be evaluated before heading into her cycle 4, day 1 of Adriamycin/vinblastine/dacarbazine (AVD) chemotherapy.  The patient claims to have tolerated her last treatment of AVD much better after doses were decreased by 25%.  She did have some moderate problems with bone pain, likely related to her white cell shot being used to prevent severe neutropenia from developing and delaying cycles of treatment.  She still has problems with urinary tract infections for which she is scheduled to see a urologist in the forthcoming weeks.  As it pertains to her Hodgkin lymphoma, she denies having any B symptoms or prominent lymphadenopathy which concerns her for refractory disease while on treatment.  PHYSICAL EXAM:  Blood pressure (!) 182/74, pulse (!) 107, temperature 98 F (36.7 C), resp. rate 14, height 5' 2.5" (1.588 m), weight 129 lb 14.4 oz (58.9 kg), SpO2 99 %. Wt Readings from Last 3 Encounters:  05/11/22 129 lb (58.5 kg)  05/10/22 129 lb 14.4 oz (58.9 kg)  04/30/22 124 lb 12 oz (56.6 kg)   Body mass index is 23.38 kg/m. Performance status (ECOG): 1 - Symptomatic but completely ambulatory Physical Exam Constitutional:      Appearance: Normal appearance.     Comments: Anxious, but is in better spirits.  HENT:     Mouth/Throat:     Pharynx: Oropharynx is clear. No oropharyngeal exudate.  Cardiovascular:     Rate and Rhythm: Normal rate and regular rhythm.     Heart sounds: No murmur heard.    No friction rub. No gallop.  Pulmonary:     Breath sounds: Normal breath sounds.  Chest:  Breasts:    Right: No swelling, bleeding, inverted nipple, mass, nipple discharge or skin change.     Left: No  swelling, bleeding, inverted nipple, mass, nipple discharge or skin change.  Abdominal:     General: Bowel sounds are normal. There is no distension.     Palpations: Abdomen is soft. There is no mass.     Tenderness: There is no abdominal tenderness.  Musculoskeletal:        General: No tenderness.     Cervical back: Normal range of motion and neck supple.     Right lower leg: No edema.     Left lower leg: No edema.  Lymphadenopathy:     Cervical: No cervical adenopathy.     Right cervical: No superficial, deep or posterior cervical adenopathy.    Left cervical: No superficial, deep or posterior cervical adenopathy.     Upper Body:     Right upper body: No supraclavicular or axillary adenopathy.     Left upper body: No supraclavicular or axillary adenopathy.     Lower Body: No right inguinal adenopathy. No left inguinal adenopathy.  Skin:    Coloration: Skin is not jaundiced.     Findings: No lesion or rash.  Neurological:     General: No focal deficit present.     Mental Status: She is alert and oriented to person, place, and time. Mental status is at baseline.  Psychiatric:        Mood and Affect: Mood normal.        Behavior: Behavior normal.  Thought Content: Thought content normal.        Judgment: Judgment normal.    LABS:  Latest Reference Range & Units 05/10/22 00:00  Sodium 137 - 147  140 (E)  Potassium 3.5 - 5.1 mEq/L 4.4 (E)  Chloride 99 - 108  105 (E)  CO2 13 - 22  26 ! (E)  Glucose  103 (E)  BUN 4 - 21  14 (E)  Creatinine 0.5 - 1.1  0.9 (E)  Calcium 8.7 - 10.7  8.8 (E)  Alkaline Phosphatase 25 - 125  101 (E)  Albumin 3.5 - 5.0  4.1 (E)  AST 13 - 35  26 (E)  ALT 7 - 35 U/L 16 (E)  Bilirubin, Total  0.4 (E)  WBC  7.5 (E)  RBC 3.87 - 5.11  2.76 ! (E)  Hemoglobin 12.0 - 16.0  9.2 ! (E)  HCT 36 - 46  28 ! (E)  Platelets 150 - 400 K/uL 107 ! (E)  NEUT#  5.85 (E)  !: Data is abnormal (E): External lab result  ASSESSMENT & PLAN:  Assessment/Plan:  A  76 y.o. female with stage IIIA classical Hodgkin lymphoma, nodular sclerosis subtype.  She will proceed with cycle 4, day 1 of AVD tomorrow.  I am pleased as it appears she has done much better with tolerating this chemotherapy regimen after the 25% dose reduction.  She knows she will continue to receive white cell shot therapy to prevent severe neutropenia from delaying successive cycles of treatment.  I will see her back in 2 weeks before she heads into cycle 4, day 15 of AVD treatment.  A PET scan will be done before that next visit to ascertain her new disease baseline essentially midway through all of her treatment.  Both the patient and her family understand all the plans discussed today and are in agreement with them.  Kelby Lotspeich Macarthur Critchley, MD

## 2022-05-11 ENCOUNTER — Other Ambulatory Visit: Payer: Self-pay | Admitting: Hematology and Oncology

## 2022-05-11 ENCOUNTER — Inpatient Hospital Stay: Payer: Medicare HMO

## 2022-05-11 ENCOUNTER — Other Ambulatory Visit: Payer: Self-pay | Admitting: Oncology

## 2022-05-11 ENCOUNTER — Telehealth (HOSPITAL_COMMUNITY): Payer: Self-pay | Admitting: *Deleted

## 2022-05-11 ENCOUNTER — Encounter: Payer: Self-pay | Admitting: Oncology

## 2022-05-11 VITALS — BP 160/74 | HR 104 | Temp 98.1°F | Resp 18 | Ht 62.5 in | Wt 129.0 lb

## 2022-05-11 DIAGNOSIS — C8118 Nodular sclerosis classical Hodgkin lymphoma, lymph nodes of multiple sites: Secondary | ICD-10-CM

## 2022-05-11 DIAGNOSIS — Z79899 Other long term (current) drug therapy: Secondary | ICD-10-CM | POA: Diagnosis not present

## 2022-05-11 DIAGNOSIS — Z5111 Encounter for antineoplastic chemotherapy: Secondary | ICD-10-CM | POA: Diagnosis not present

## 2022-05-11 DIAGNOSIS — Z5189 Encounter for other specified aftercare: Secondary | ICD-10-CM | POA: Diagnosis not present

## 2022-05-11 MED ORDER — SODIUM CHLORIDE 0.9% FLUSH
10.0000 mL | INTRAVENOUS | Status: DC | PRN
  Administered 2022-05-11: 10 mL

## 2022-05-11 MED ORDER — DEXAMETHASONE SODIUM PHOSPHATE 10 MG/ML IJ SOLN
4.0000 mg | Freq: Once | INTRAMUSCULAR | Status: AC
  Administered 2022-05-11: 4 mg via INTRAVENOUS
  Filled 2022-05-11: qty 1

## 2022-05-11 MED ORDER — PALONOSETRON HCL INJECTION 0.25 MG/5ML
0.2500 mg | Freq: Once | INTRAVENOUS | Status: AC
  Administered 2022-05-11: 0.25 mg via INTRAVENOUS
  Filled 2022-05-11: qty 5

## 2022-05-11 MED ORDER — DOXORUBICIN HCL CHEMO IV INJECTION 2 MG/ML
18.7500 mg/m2 | Freq: Once | INTRAVENOUS | Status: AC
  Administered 2022-05-11: 32 mg via INTRAVENOUS
  Filled 2022-05-11: qty 16

## 2022-05-11 MED ORDER — SODIUM CHLORIDE 0.9 % IV SOLN: Freq: Once | INTRAVENOUS | Status: AC

## 2022-05-11 MED ORDER — SODIUM CHLORIDE 0.9 % IV SOLN
281.2500 mg/m2 | Freq: Once | INTRAVENOUS | Status: AC
  Administered 2022-05-11: 470 mg via INTRAVENOUS
  Filled 2022-05-11: qty 47

## 2022-05-11 MED ORDER — SODIUM CHLORIDE 0.9 % IV SOLN
150.0000 mg | Freq: Once | INTRAVENOUS | Status: AC
  Administered 2022-05-11: 150 mg via INTRAVENOUS
  Filled 2022-05-11: qty 150

## 2022-05-11 MED ORDER — HEPARIN SOD (PORK) LOCK FLUSH 100 UNIT/ML IV SOLN
500.0000 [IU] | Freq: Once | INTRAVENOUS | Status: AC | PRN
  Administered 2022-05-11: 500 [IU]

## 2022-05-11 MED ORDER — LORAZEPAM 2 MG/ML IJ SOLN: 0.5000 mg | Freq: Once | INTRAMUSCULAR | Status: DC | PRN

## 2022-05-11 MED ORDER — VINBLASTINE SULFATE CHEMO INJECTION 1 MG/ML
4.5000 mg/m2 | Freq: Once | INTRAVENOUS | Status: AC
  Administered 2022-05-11: 7.5 mg via INTRAVENOUS
  Filled 2022-05-11: qty 7.5

## 2022-05-11 NOTE — Patient Instructions (Signed)
Vinblastine injection What is this medication? VINBLASTINE (vin BLAS teen) is a chemotherapy drug. It slows the growth of cancer cells. This medicine is used to treat many types of cancer like breast cancer, testicular cancer, Hodgkin's disease, non-Hodgkin's lymphoma, and sarcoma. This medicine may be used for other purposes; ask your health care provider or pharmacist if you have questions. COMMON BRAND NAME(S): Velban What should I tell my care team before I take this medication? They need to know if you have any of these conditions: blood disorders dental disease gout infection (especially a virus infection such as chickenpox, cold sores, or herpes) liver disease lung disease nervous system disease recent or ongoing radiation therapy an unusual or allergic reaction to vinblastine, other chemotherapy agents, other medicines, foods, dyes, or preservatives pregnant or trying to get pregnant breast-feeding How should I use this medication? This drug is given as an infusion into a vein. It is administered in a hospital or clinic by a specially trained health care professional. If you have pain, swelling, burning or any unusual feeling around the site of your injection, tell your health care professional right away. Talk to your pediatrician regarding the use of this medicine in children. While this drug may be prescribed for selected conditions, precautions do apply. Overdosage: If you think you have taken too much of this medicine contact a poison control center or emergency room at once. NOTE: This medicine is only for you. Do not share this medicine with others. What if I miss a dose? It is important not to miss your dose. Call your doctor or health care professional if you are unable to keep an appointment. What may interact with this medication? erythromycin certain medicines for fungal infections like itraconazole, ketoconazole, posaconazole, voriconazole certain medicines for  seizures like phenytoin This list may not describe all possible interactions. Give your health care provider a list of all the medicines, herbs, non-prescription drugs, or dietary supplements you use. Also tell them if you smoke, drink alcohol, or use illegal drugs. Some items may interact with your medicine. What should I watch for while using this medication? Your condition will be monitored carefully while you are receiving this medicine. You will need important blood work done while you are taking this medicine. This drug may make you feel generally unwell. This is not uncommon, as chemotherapy can affect healthy cells as well as cancer cells. Report any side effects. Continue your course of treatment even though you feel ill unless your doctor tells you to stop. In some cases, you may be given additional medicines to help with side effects. Follow all directions for their use. Call your doctor or health care professional for advice if you get a fever, chills or sore throat, or other symptoms of a cold or flu. Do not treat yourself. This drug decreases your body's ability to fight infections. Try to avoid being around people who are sick. This medicine may increase your risk to bruise or bleed. Call your doctor or health care professional if you notice any unusual bleeding. Be careful brushing and flossing your teeth or using a toothpick because you may get an infection or bleed more easily. If you have any dental work done, tell your dentist you are receiving this medicine. Avoid taking products that contain aspirin, acetaminophen, ibuprofen, naproxen, or ketoprofen unless instructed by your doctor. These medicines may hide a fever. Do not become pregnant while taking this medicine. Women should inform their doctor if they wish to become pregnant or  think they might be pregnant. There is a potential for serious side effects to an unborn child. Talk to your health care professional or pharmacist for  more information. Do not breast-feed an infant while taking this medicine. Men may have a lower sperm count while taking this medicine. Talk to your doctor if you plan to father a child. What side effects may I notice from receiving this medication? Side effects that you should report to your doctor or health care professional as soon as possible: allergic reactions like skin rash, itching or hives, swelling of the face, lips, or tongue low blood counts - This drug may decrease the number of white blood cells, red blood cells and platelets. You may be at increased risk for infections and bleeding. signs of infection - fever or chills, cough, sore throat, pain or difficulty passing urine signs of decreased platelets or bleeding - bruising, pinpoint red spots on the skin, black, tarry stools, nosebleeds signs of decreased red blood cells - unusually weak or tired, fainting spells, lightheadedness breathing problems changes in hearing change in the amount of urine chest pain high blood pressure mouth sores nausea and vomiting pain, swelling, redness or irritation at the injection site pain, tingling, numbness in the hands or feet problems with balance, dizziness seizures Side effects that usually do not require medical attention (report to your doctor or health care professional if they continue or are bothersome): constipation hair loss jaw pain loss of appetite sensitivity to light stomach pain tumor pain This list may not describe all possible side effects. Call your doctor for medical advice about side effects. You may report side effects to FDA at 1-800-FDA-1088. Where should I keep my medication? This drug is given in a hospital or clinic and will not be stored at home. NOTE: This sheet is a summary. It may not cover all possible information. If you have questions about this medicine, talk to your doctor, pharmacist, or health care provider.  2023 Elsevier/Gold Standard  (2021-10-13 00:00:00) Dacarbazine, DTIC injection What is this medication? DACARBAZINE (da KAR ba zeen) is a chemotherapy drug. This medicine is used to treat skin cancer. It is also used with other medicines to treat Hodgkin's disease. This medicine may be used for other purposes; ask your health care provider or pharmacist if you have questions. COMMON BRAND NAME(S): DTIC-Dome What should I tell my care team before I take this medication? They need to know if you have any of these conditions: infection (especially virus infection such as chickenpox, cold sores, or herpes) kidney disease liver disease low blood counts like low platelets, red blood cells, white blood cells recent radiation therapy an unusual or allergic reaction to dacarbazine, other chemotherapy agents, other medicines, foods, dyes, or preservatives pregnant or trying to get pregnant breast-feeding How should I use this medication? This drug is given as an injection or infusion into a vein. It is administered in a hospital or clinic by a specially trained health care professional. Talk to your pediatrician regarding the use of this medicine in children. While this drug may be prescribed for selected conditions, precautions do apply. Overdosage: If you think you have taken too much of this medicine contact a poison control center or emergency room at once. NOTE: This medicine is only for you. Do not share this medicine with others. What if I miss a dose? It is important not to miss your dose. Call your doctor or health care professional if you are unable to keep an appointment.  What may interact with this medication? medicines to increase blood counts like filgrastim, pegfilgrastim, sargramostim vaccines This list may not describe all possible interactions. Give your health care provider a list of all the medicines, herbs, non-prescription drugs, or dietary supplements you use. Also tell them if you smoke, drink alcohol, or  use illegal drugs. Some items may interact with your medicine. What should I watch for while using this medication? Your condition will be monitored carefully while you are receiving this medicine. You will need important blood work done while you are taking this medicine. This drug may make you feel generally unwell. This is not uncommon, as chemotherapy can affect healthy cells as well as cancer cells. Report any side effects. Continue your course of treatment even though you feel ill unless your doctor tells you to stop. Call your doctor or health care professional for advice if you get a fever, chills or sore throat, or other symptoms of a cold or flu. Do not treat yourself. This drug decreases your body's ability to fight infections. Try to avoid being around people who are sick. This medicine may increase your risk to bruise or bleed. Call your doctor or health care professional if you notice any unusual bleeding. Talk to your doctor about your risk of cancer. You may be more at risk for certain types of cancers if you take this medicine. Do not become pregnant while taking this medicine. Women should inform their doctor if they wish to become pregnant or think they might be pregnant. There is a potential for serious side effects to an unborn child. Talk to your health care professional or pharmacist for more information. Do not breast-feed an infant while taking this medicine. What side effects may I notice from receiving this medication? Side effects that you should report to your doctor or health care professional as soon as possible: allergic reactions like skin rash, itching or hives, swelling of the face, lips, or tongue low blood counts - this medicine may decrease the number of white blood cells, red blood cells and platelets. You may be at increased risk for infections and bleeding. signs of infection - fever or chills, cough, sore throat, pain or difficulty passing urine signs of  decreased platelets or bleeding - bruising, pinpoint red spots on the skin, black, tarry stools, blood in the urine signs of decreased red blood cells - unusually weak or tired, fainting spells, lightheadedness breathing problems muscle pains pain at site where injected trouble passing urine or change in the amount of urine vomiting yellowing of the eyes or skin Side effects that usually do not require medical attention (report to your doctor or health care professional if they continue or are bothersome): diarrhea hair loss loss of appetite nausea skin more sensitive to sun or ultraviolet light stomach upset This list may not describe all possible side effects. Call your doctor for medical advice about side effects. You may report side effects to FDA at 1-800-FDA-1088. Where should I keep my medication? This drug is given in a hospital or clinic and will not be stored at home. NOTE: This sheet is a summary. It may not cover all possible information. If you have questions about this medicine, talk to your doctor, pharmacist, or health care provider.  2023 Elsevier/Gold Standard (2016-05-30 00:00:00) Doxorubicin injection What is this medication? DOXORUBICIN (dox oh ROO bi sin) is a chemotherapy drug. It is used to treat many kinds of cancer like leukemia, lymphoma, neuroblastoma, sarcoma, and Wilms' tumor. It  is also used to treat bladder cancer, breast cancer, lung cancer, ovarian cancer, stomach cancer, and thyroid cancer. This medicine may be used for other purposes; ask your health care provider or pharmacist if you have questions. COMMON BRAND NAME(S): Adriamycin, Adriamycin PFS, Adriamycin RDF, Rubex What should I tell my care team before I take this medication? They need to know if you have any of these conditions: heart disease history of low blood counts caused by a medicine liver disease recent or ongoing radiation therapy an unusual or allergic reaction to doxorubicin,  other chemotherapy agents, other medicines, foods, dyes, or preservatives pregnant or trying to get pregnant breast-feeding How should I use this medication? This drug is given as an infusion into a vein. It is administered in a hospital or clinic by a specially trained health care professional. If you have pain, swelling, burning or any unusual feeling around the site of your injection, tell your health care professional right away. Talk to your pediatrician regarding the use of this medicine in children. Special care may be needed. Overdosage: If you think you have taken too much of this medicine contact a poison control center or emergency room at once. NOTE: This medicine is only for you. Do not share this medicine with others. What if I miss a dose? It is important not to miss your dose. Call your doctor or health care professional if you are unable to keep an appointment. What may interact with this medication? This medicine may interact with the following medications: 6-mercaptopurine paclitaxel phenytoin St. John's Wort trastuzumab verapamil This list may not describe all possible interactions. Give your health care provider a list of all the medicines, herbs, non-prescription drugs, or dietary supplements you use. Also tell them if you smoke, drink alcohol, or use illegal drugs. Some items may interact with your medicine. What should I watch for while using this medication? This drug may make you feel generally unwell. This is not uncommon, as chemotherapy can affect healthy cells as well as cancer cells. Report any side effects. Continue your course of treatment even though you feel ill unless your doctor tells you to stop. There is a maximum amount of this medicine you should receive throughout your life. The amount depends on the medical condition being treated and your overall health. Your doctor will watch how much of this medicine you receive in your lifetime. Tell your doctor if  you have taken this medicine before. You may need blood work done while you are taking this medicine. Your urine may turn red for a few days after your dose. This is not blood. If your urine is dark or brown, call your doctor. In some cases, you may be given additional medicines to help with side effects. Follow all directions for their use. Call your doctor or health care professional for advice if you get a fever, chills or sore throat, or other symptoms of a cold or flu. Do not treat yourself. This drug decreases your body's ability to fight infections. Try to avoid being around people who are sick. This medicine may increase your risk to bruise or bleed. Call your doctor or health care professional if you notice any unusual bleeding. Talk to your doctor about your risk of cancer. You may be more at risk for certain types of cancers if you take this medicine. Do not become pregnant while taking this medicine or for 6 months after stopping it. Women should inform their doctor if they wish to become pregnant  or think they might be pregnant. Men should not father a child while taking this medicine and for 6 months after stopping it. There is a potential for serious side effects to an unborn child. Talk to your health care professional or pharmacist for more information. Do not breast-feed an infant while taking this medicine. This medicine has caused ovarian failure in some women and reduced sperm counts in some men This medicine may interfere with the ability to have a child. Talk with your doctor or health care professional if you are concerned about your fertility. This medicine may cause a decrease in Co-Enzyme Q-10. You should make sure that you get enough Co-Enzyme Q-10 while you are taking this medicine. Discuss the foods you eat and the vitamins you take with your health care professional. What side effects may I notice from receiving this medication? Side effects that you should report to your  doctor or health care professional as soon as possible: allergic reactions like skin rash, itching or hives, swelling of the face, lips, or tongue breathing problems chest pain fast or irregular heartbeat low blood counts - this medicine may decrease the number of white blood cells, red blood cells and platelets. You may be at increased risk for infections and bleeding. pain, redness, or irritation at site where injected signs of infection - fever or chills, cough, sore throat, pain or difficulty passing urine signs of decreased platelets or bleeding - bruising, pinpoint red spots on the skin, black, tarry stools, blood in the urine swelling of the ankles, feet, hands tiredness weakness Side effects that usually do not require medical attention (report to your doctor or health care professional if they continue or are bothersome): diarrhea hair loss mouth sores nail discoloration or damage nausea red colored urine vomiting This list may not describe all possible side effects. Call your doctor for medical advice about side effects. You may report side effects to FDA at 1-800-FDA-1088. Where should I keep my medication? This drug is given in a hospital or clinic and will not be stored at home. NOTE: This sheet is a summary. It may not cover all possible information. If you have questions about this medicine, talk to your doctor, pharmacist, or health care provider.  2023 Elsevier/Gold Standard (2017-07-18 00:00:00)

## 2022-05-11 NOTE — Telephone Encounter (Signed)
Tammy from cytology at [facility] called and needed to know if the sputum needed to be or has already been collected. Message sent to Raelyn Ensign, [place] and she stated the sputum sample was sent to the lab at [facility] per the Phlebotomist at [facility]. Called Tammy at [facility] to let her know the sptutm sample was collected at [facility] laboratory. Tammy verbalized understanding and stated the orders need to be canceled. Message sent to T.Loyal Buba, CMA to let her know the order needed to be canceled and she verbalized understanding. Orders were canceled by Melissa Parson,NP.

## 2022-05-13 ENCOUNTER — Encounter: Payer: Self-pay | Admitting: Oncology

## 2022-05-14 ENCOUNTER — Inpatient Hospital Stay: Payer: Medicare HMO

## 2022-05-14 ENCOUNTER — Telehealth: Payer: Self-pay | Admitting: Oncology

## 2022-05-14 VITALS — BP 133/60 | HR 105 | Temp 97.5°F | Resp 18 | Wt 130.0 lb

## 2022-05-14 DIAGNOSIS — Z5189 Encounter for other specified aftercare: Secondary | ICD-10-CM | POA: Diagnosis not present

## 2022-05-14 DIAGNOSIS — Z5111 Encounter for antineoplastic chemotherapy: Secondary | ICD-10-CM | POA: Diagnosis not present

## 2022-05-14 DIAGNOSIS — C8118 Nodular sclerosis classical Hodgkin lymphoma, lymph nodes of multiple sites: Secondary | ICD-10-CM | POA: Diagnosis not present

## 2022-05-14 DIAGNOSIS — Z79899 Other long term (current) drug therapy: Secondary | ICD-10-CM | POA: Diagnosis not present

## 2022-05-14 MED ORDER — PEGFILGRASTIM-JMDB 6 MG/0.6ML ~~LOC~~ SOSY
6.0000 mg | PREFILLED_SYRINGE | Freq: Once | SUBCUTANEOUS | Status: AC
  Administered 2022-05-14: 6 mg via SUBCUTANEOUS
  Filled 2022-05-14: qty 0.6

## 2022-05-14 NOTE — Patient Instructions (Signed)

## 2022-05-14 NOTE — Telephone Encounter (Signed)
PET has been scheduled for 05/23/22 @ 9 am; checking in @ 8:30 am  Notified pt of date,time and instructions.

## 2022-05-22 DIAGNOSIS — C8190 Hodgkin lymphoma, unspecified, unspecified site: Secondary | ICD-10-CM | POA: Diagnosis not present

## 2022-05-22 DIAGNOSIS — K551 Chronic vascular disorders of intestine: Secondary | ICD-10-CM | POA: Diagnosis not present

## 2022-05-22 DIAGNOSIS — E049 Nontoxic goiter, unspecified: Secondary | ICD-10-CM | POA: Diagnosis not present

## 2022-05-22 DIAGNOSIS — C8118 Nodular sclerosis classical Hodgkin lymphoma, lymph nodes of multiple sites: Secondary | ICD-10-CM | POA: Diagnosis not present

## 2022-05-22 DIAGNOSIS — I2510 Atherosclerotic heart disease of native coronary artery without angina pectoris: Secondary | ICD-10-CM | POA: Diagnosis not present

## 2022-05-23 ENCOUNTER — Encounter: Payer: Self-pay | Admitting: Oncology

## 2022-05-23 NOTE — Progress Notes (Signed)
[facility]  [address] [place],  [place]  [address] [phone]  Clinic Day:  05/24/2022  Referring physician: Serita Grammes, MD  HISTORY OF PRESENT ILLNESS:  The patient is a 76 y.o. female with stage IIIA classical Hodgkin lymphoma.  She comes in today to go over her PET scan images after receiving 3+ cycles of Adriamycin/vinblastine/dacarbazine (AVD) chemotherapy.  The patient claims to be tolerating her AVD treatments much better since the doses were decreased by 25%.  She did have some moderate problems with bone pain, likely related to her white cell shot being used to prevent severe neutropenia from developing and delaying cycles of treatment.  She still has coughing spells, which her family attributes to postnasal drip.  As it pertains to her Hodgkin lymphoma, she denies having any B symptoms or prominent lymphadenopathy which concerns her for refractory disease while on treatment.  PHYSICAL EXAM:  Blood pressure (!) 175/74, pulse (!) 111, temperature 98.1 F (36.7 C), resp. rate 14, height 5' 2.5" (1.588 m), weight 128 lb 8 oz (58.3 kg), SpO2 97 %. Wt Readings from Last 3 Encounters:  05/24/22 128 lb 8 oz (58.3 kg)  05/14/22 130 lb 0.6 oz (59 kg)  05/11/22 129 lb (58.5 kg)   Body mass index is 23.13 kg/m. Performance status (ECOG): 1 - Symptomatic but completely ambulatory Physical Exam Constitutional:      Appearance: Normal appearance.     Comments: Anxious, but is in better spirits.  HENT:     Mouth/Throat:     Pharynx: Oropharynx is clear. No oropharyngeal exudate.  Cardiovascular:     Rate and Rhythm: Normal rate and regular rhythm.     Heart sounds: No murmur heard.    No friction rub. No gallop.  Pulmonary:     Breath sounds: Normal breath sounds.  Chest:  Breasts:    Right: No swelling, bleeding, inverted nipple, mass, nipple discharge or skin change.     Left: No swelling, bleeding, inverted nipple, mass, nipple  discharge or skin change.  Abdominal:     General: Bowel sounds are normal. There is no distension.     Palpations: Abdomen is soft. There is no mass.     Tenderness: There is no abdominal tenderness.  Musculoskeletal:        General: No tenderness.     Cervical back: Normal range of motion and neck supple.     Right lower leg: No edema.     Left lower leg: No edema.  Lymphadenopathy:     Cervical: No cervical adenopathy.     Right cervical: No superficial, deep or posterior cervical adenopathy.    Left cervical: No superficial, deep or posterior cervical adenopathy.     Upper Body:     Right upper body: No supraclavicular or axillary adenopathy.     Left upper body: No supraclavicular or axillary adenopathy.     Lower Body: No right inguinal adenopathy. No left inguinal adenopathy.  Skin:    Coloration: Skin is not jaundiced.     Findings: No lesion or rash.  Neurological:     General: No focal deficit present.     Mental Status: She is alert and oriented to person, place, and time. Mental status is at baseline.  Psychiatric:        Mood and Affect: Mood normal.        Behavior: Behavior normal.        Thought Content: Thought content normal.  Judgment: Judgment normal.   SCANS: Her PET scan revealed the following: FINDINGS: Mediastinal blood pool activity: SUV max 1.8  Liver activity: SUV max 2.9  NECK: No significant abnormal hypermetabolic activity in this region.  Incidental CT findings: Bilateral common carotid atherosclerotic calcification.  CHEST: Previous hypermetabolic right axillary, right subpectoral, prevascular, paratracheal, AP window, subcarinal adenopathy has completely resolved ([place]auville 1).  Incidental CT findings: Right eccentric thyroid goiter, previously worked up on thyroid ultrasound 11/01/2021. Left subclavian Port-A-Cath noted, tip in the lower SVC. Coronary, aortic arch, and branch vessel atherosclerotic vascular  disease.  ABDOMEN/PELVIS: Focal anal activity without specific CT abnormality, maximum SUV 5.9 and previously 6.3, probably physiologic. Previous hypermetabolic retroperitoneal adenopathy has completely resolved (Deauville 1).  Incidental CT findings: The spleen measures 10.7 by 5.1 by 7.5 cm (volume = 210 cm^3), within normal limits. Photopenic left kidney lower pole exophytic cyst, this benign cyst does not require further imaging workup. Cholecystectomy. Aortic Atherosclerosis (ICD10-I70.0). Substantial atheromatous plaque proximally in the celiac trunk proximally in the superior mesenteric artery.  SKELETON: Diffuse accentuated skeletal uptake in the marrow. Index measurements centrally in the L5 vertebral body demonstrates maximum SUV of 5.6.  Incidental CT findings: none  IMPRESSION: 1. Complete resolution of prior hypermetabolic activity in the chest and abdomen (Deauville 1). 2. Widespread skeletal accentuated metabolic activity, probably from granulocyte stimulation (reported Neulasta dose 2 weeks prior to imaging). 3. Extensive atherosclerosis as noted above, including coronary arteries and systemic involvement. Aortic Atherosclerosis (ICD10-I70.0).  LABS:   ASSESSMENT & PLAN:  Assessment/Plan:  A 76 y.o. female with stage IIIA classical Hodgkin lymphoma, nodular sclerosis subtype.  In clinic today, went over her PET scan images with her, for which she can see the all of her previous areas of disease have been completely eradicated.  Understandably, the patient and her family were elated with her disease response.  She will proceed with cycle 4, day 15 of AVD tomorrow.  I am pleased as it appears she is doing better with tolerating this chemotherapy regimen after the 25% dose reduction.  She will continue to receive white cell shot therapy to prevent severe neutropenia from delaying successive cycles of treatment.  I will see her back in 2 weeks before she heads into cycle  5, day 1 of AVD treatment.  Both the patient and her family understand all the plans discussed today and are in agreement with them.  Quinci Gavidia Macarthur Critchley, MD

## 2022-05-24 ENCOUNTER — Inpatient Hospital Stay (INDEPENDENT_AMBULATORY_CARE_PROVIDER_SITE_OTHER): Payer: Medicare HMO | Admitting: Oncology

## 2022-05-24 ENCOUNTER — Inpatient Hospital Stay: Payer: Medicare HMO

## 2022-05-24 VITALS — BP 175/74 | HR 111 | Temp 98.1°F | Resp 14 | Ht 62.5 in | Wt 128.5 lb

## 2022-05-24 DIAGNOSIS — C8138 Lymphocyte depleted classical Hodgkin lymphoma, lymph nodes of multiple sites: Secondary | ICD-10-CM

## 2022-05-24 DIAGNOSIS — C8118 Nodular sclerosis classical Hodgkin lymphoma, lymph nodes of multiple sites: Secondary | ICD-10-CM | POA: Diagnosis not present

## 2022-05-24 DIAGNOSIS — Z79899 Other long term (current) drug therapy: Secondary | ICD-10-CM | POA: Diagnosis not present

## 2022-05-24 DIAGNOSIS — Z5111 Encounter for antineoplastic chemotherapy: Secondary | ICD-10-CM | POA: Diagnosis not present

## 2022-05-24 DIAGNOSIS — Z5189 Encounter for other specified aftercare: Secondary | ICD-10-CM | POA: Diagnosis not present

## 2022-05-24 LAB — SEDIMENTATION RATE: Sed Rate: 23 mm/hr — ABNORMAL HIGH (ref 0–22)

## 2022-05-24 MED FILL — Fosaprepitant Dimeglumine For IV Infusion 150 MG (Base Eq): INTRAVENOUS | Qty: 5 | Status: AC

## 2022-05-25 ENCOUNTER — Inpatient Hospital Stay: Payer: Medicare HMO

## 2022-05-25 ENCOUNTER — Other Ambulatory Visit: Payer: Self-pay | Admitting: Oncology

## 2022-05-25 VITALS — BP 130/66 | HR 111 | Temp 98.2°F | Resp 18 | Ht 62.5 in | Wt 128.0 lb

## 2022-05-25 DIAGNOSIS — C8118 Nodular sclerosis classical Hodgkin lymphoma, lymph nodes of multiple sites: Secondary | ICD-10-CM

## 2022-05-25 DIAGNOSIS — Z5189 Encounter for other specified aftercare: Secondary | ICD-10-CM | POA: Diagnosis not present

## 2022-05-25 DIAGNOSIS — Z5111 Encounter for antineoplastic chemotherapy: Secondary | ICD-10-CM | POA: Diagnosis not present

## 2022-05-25 DIAGNOSIS — Z79899 Other long term (current) drug therapy: Secondary | ICD-10-CM | POA: Diagnosis not present

## 2022-05-25 MED ORDER — PALONOSETRON HCL INJECTION 0.25 MG/5ML
0.2500 mg | Freq: Once | INTRAVENOUS | Status: AC
  Administered 2022-05-25: 0.25 mg via INTRAVENOUS
  Filled 2022-05-25: qty 5

## 2022-05-25 MED ORDER — SODIUM CHLORIDE 0.9% FLUSH
10.0000 mL | INTRAVENOUS | Status: DC | PRN
  Administered 2022-05-25: 10 mL

## 2022-05-25 MED ORDER — SODIUM CHLORIDE 0.9 % IV SOLN
150.0000 mg | Freq: Once | INTRAVENOUS | Status: AC
  Administered 2022-05-25: 150 mg via INTRAVENOUS
  Filled 2022-05-25: qty 150

## 2022-05-25 MED ORDER — DOXORUBICIN HCL CHEMO IV INJECTION 2 MG/ML
18.7500 mg/m2 | Freq: Once | INTRAVENOUS | Status: AC
  Administered 2022-05-25: 32 mg via INTRAVENOUS
  Filled 2022-05-25: qty 16

## 2022-05-25 MED ORDER — SODIUM CHLORIDE 0.9 % IV SOLN
281.2500 mg/m2 | Freq: Once | INTRAVENOUS | Status: AC
  Administered 2022-05-25: 470 mg via INTRAVENOUS
  Filled 2022-05-25: qty 47

## 2022-05-25 MED ORDER — HEPARIN SOD (PORK) LOCK FLUSH 100 UNIT/ML IV SOLN
500.0000 [IU] | Freq: Once | INTRAVENOUS | Status: AC | PRN
  Administered 2022-05-25: 500 [IU]

## 2022-05-25 MED ORDER — LORAZEPAM 2 MG/ML IJ SOLN: 0.5000 mg | Freq: Once | INTRAMUSCULAR | Status: DC | PRN

## 2022-05-25 MED ORDER — DEXAMETHASONE SODIUM PHOSPHATE 10 MG/ML IJ SOLN
4.0000 mg | Freq: Once | INTRAMUSCULAR | Status: AC
  Administered 2022-05-25: 4 mg via INTRAVENOUS
  Filled 2022-05-25: qty 1

## 2022-05-25 MED ORDER — VINBLASTINE SULFATE CHEMO INJECTION 1 MG/ML
4.5000 mg/m2 | Freq: Once | INTRAVENOUS | Status: AC
  Administered 2022-05-25: 7.5 mg via INTRAVENOUS
  Filled 2022-05-25: qty 7.5

## 2022-05-25 MED ORDER — SODIUM CHLORIDE 0.9 % IV SOLN: Freq: Once | INTRAVENOUS | Status: AC

## 2022-05-25 NOTE — Patient Instructions (Signed)
Dacarbazine, DTIC injection What is this medication? DACARBAZINE (da KAR ba zeen) is a chemotherapy drug. This medicine is used to treat skin cancer. It is also used with other medicines to treat Hodgkin's disease. This medicine may be used for other purposes; ask your health care provider or pharmacist if you have questions. COMMON BRAND NAME(S): DTIC-Dome What should I tell my care team before I take this medication? They need to know if you have any of these conditions: infection (especially virus infection such as chickenpox, cold sores, or herpes) kidney disease liver disease low blood counts like low platelets, red blood cells, white blood cells recent radiation therapy an unusual or allergic reaction to dacarbazine, other chemotherapy agents, other medicines, foods, dyes, or preservatives pregnant or trying to get pregnant breast-feeding How should I use this medication? This drug is given as an injection or infusion into a vein. It is administered in a hospital or clinic by a specially trained health care professional. Talk to your pediatrician regarding the use of this medicine in children. While this drug may be prescribed for selected conditions, precautions do apply. Overdosage: If you think you have taken too much of this medicine contact a poison control center or emergency room at once. NOTE: This medicine is only for you. Do not share this medicine with others. What if I miss a dose? It is important not to miss your dose. Call your doctor or health care professional if you are unable to keep an appointment. What may interact with this medication? medicines to increase blood counts like filgrastim, pegfilgrastim, sargramostim vaccines This list may not describe all possible interactions. Give your health care provider a list of all the medicines, herbs, non-prescription drugs, or dietary supplements you use. Also tell them if you smoke, drink alcohol, or use illegal drugs.  Some items may interact with your medicine. What should I watch for while using this medication? Your condition will be monitored carefully while you are receiving this medicine. You will need important blood work done while you are taking this medicine. This drug may make you feel generally unwell. This is not uncommon, as chemotherapy can affect healthy cells as well as cancer cells. Report any side effects. Continue your course of treatment even though you feel ill unless your doctor tells you to stop. Call your doctor or health care professional for advice if you get a fever, chills or sore throat, or other symptoms of a cold or flu. Do not treat yourself. This drug decreases your body's ability to fight infections. Try to avoid being around people who are sick. This medicine may increase your risk to bruise or bleed. Call your doctor or health care professional if you notice any unusual bleeding. Talk to your doctor about your risk of cancer. You may be more at risk for certain types of cancers if you take this medicine. Do not become pregnant while taking this medicine. Women should inform their doctor if they wish to become pregnant or think they might be pregnant. There is a potential for serious side effects to an unborn child. Talk to your health care professional or pharmacist for more information. Do not breast-feed an infant while taking this medicine. What side effects may I notice from receiving this medication? Side effects that you should report to your doctor or health care professional as soon as possible: allergic reactions like skin rash, itching or hives, swelling of the face, lips, or tongue low blood counts - this medicine may decrease  the number of white blood cells, red blood cells and platelets. You may be at increased risk for infections and bleeding. signs of infection - fever or chills, cough, sore throat, pain or difficulty passing urine signs of decreased platelets or  bleeding - bruising, pinpoint red spots on the skin, black, tarry stools, blood in the urine signs of decreased red blood cells - unusually weak or tired, fainting spells, lightheadedness breathing problems muscle pains pain at site where injected trouble passing urine or change in the amount of urine vomiting yellowing of the eyes or skin Side effects that usually do not require medical attention (report to your doctor or health care professional if they continue or are bothersome): diarrhea hair loss loss of appetite nausea skin more sensitive to sun or ultraviolet light stomach upset This list may not describe all possible side effects. Call your doctor for medical advice about side effects. You may report side effects to FDA at 1-800-FDA-1088. Where should I keep my medication? This drug is given in a hospital or clinic and will not be stored at home. NOTE: This sheet is a summary. It may not cover all possible information. If you have questions about this medicine, talk to your doctor, pharmacist, or health care provider.  2023 Elsevier/Gold Standard (2016-05-30 00:00:00) Vinblastine injection What is this medication? VINBLASTINE (vin BLAS teen) is a chemotherapy drug. It slows the growth of cancer cells. This medicine is used to treat many types of cancer like breast cancer, testicular cancer, Hodgkin's disease, non-Hodgkin's lymphoma, and sarcoma. This medicine may be used for other purposes; ask your health care provider or pharmacist if you have questions. COMMON BRAND NAME(S): Velban What should I tell my care team before I take this medication? They need to know if you have any of these conditions: blood disorders dental disease gout infection (especially a virus infection such as chickenpox, cold sores, or herpes) liver disease lung disease nervous system disease recent or ongoing radiation therapy an unusual or allergic reaction to vinblastine, other chemotherapy  agents, other medicines, foods, dyes, or preservatives pregnant or trying to get pregnant breast-feeding How should I use this medication? This drug is given as an infusion into a vein. It is administered in a hospital or clinic by a specially trained health care professional. If you have pain, swelling, burning or any unusual feeling around the site of your injection, tell your health care professional right away. Talk to your pediatrician regarding the use of this medicine in children. While this drug may be prescribed for selected conditions, precautions do apply. Overdosage: If you think you have taken too much of this medicine contact a poison control center or emergency room at once. NOTE: This medicine is only for you. Do not share this medicine with others. What if I miss a dose? It is important not to miss your dose. Call your doctor or health care professional if you are unable to keep an appointment. What may interact with this medication? erythromycin certain medicines for fungal infections like itraconazole, ketoconazole, posaconazole, voriconazole certain medicines for seizures like phenytoin This list may not describe all possible interactions. Give your health care provider a list of all the medicines, herbs, non-prescription drugs, or dietary supplements you use. Also tell them if you smoke, drink alcohol, or use illegal drugs. Some items may interact with your medicine. What should I watch for while using this medication? Your condition will be monitored carefully while you are receiving this medicine. You will need important  blood work done while you are taking this medicine. This drug may make you feel generally unwell. This is not uncommon, as chemotherapy can affect healthy cells as well as cancer cells. Report any side effects. Continue your course of treatment even though you feel ill unless your doctor tells you to stop. In some cases, you may be given additional medicines  to help with side effects. Follow all directions for their use. Call your doctor or health care professional for advice if you get a fever, chills or sore throat, or other symptoms of a cold or flu. Do not treat yourself. This drug decreases your body's ability to fight infections. Try to avoid being around people who are sick. This medicine may increase your risk to bruise or bleed. Call your doctor or health care professional if you notice any unusual bleeding. Be careful brushing and flossing your teeth or using a toothpick because you may get an infection or bleed more easily. If you have any dental work done, tell your dentist you are receiving this medicine. Avoid taking products that contain aspirin, acetaminophen, ibuprofen, naproxen, or ketoprofen unless instructed by your doctor. These medicines may hide a fever. Do not become pregnant while taking this medicine. Women should inform their doctor if they wish to become pregnant or think they might be pregnant. There is a potential for serious side effects to an unborn child. Talk to your health care professional or pharmacist for more information. Do not breast-feed an infant while taking this medicine. Men may have a lower sperm count while taking this medicine. Talk to your doctor if you plan to father a child. What side effects may I notice from receiving this medication? Side effects that you should report to your doctor or health care professional as soon as possible: allergic reactions like skin rash, itching or hives, swelling of the face, lips, or tongue low blood counts - This drug may decrease the number of white blood cells, red blood cells and platelets. You may be at increased risk for infections and bleeding. signs of infection - fever or chills, cough, sore throat, pain or difficulty passing urine signs of decreased platelets or bleeding - bruising, pinpoint red spots on the skin, black, tarry stools, nosebleeds signs of  decreased red blood cells - unusually weak or tired, fainting spells, lightheadedness breathing problems changes in hearing change in the amount of urine chest pain high blood pressure mouth sores nausea and vomiting pain, swelling, redness or irritation at the injection site pain, tingling, numbness in the hands or feet problems with balance, dizziness seizures Side effects that usually do not require medical attention (report to your doctor or health care professional if they continue or are bothersome): constipation hair loss jaw pain loss of appetite sensitivity to light stomach pain tumor pain This list may not describe all possible side effects. Call your doctor for medical advice about side effects. You may report side effects to FDA at 1-800-FDA-1088. Where should I keep my medication? This drug is given in a hospital or clinic and will not be stored at home. NOTE: This sheet is a summary. It may not cover all possible information. If you have questions about this medicine, talk to your doctor, pharmacist, or health care provider.  2023 Elsevier/Gold Standard (2021-10-13 00:00:00) Doxorubicin injection What is this medication? DOXORUBICIN (dox oh ROO bi sin) is a chemotherapy drug. It is used to treat many kinds of cancer like leukemia, lymphoma, neuroblastoma, sarcoma, and Wilms' tumor. It  is also used to treat bladder cancer, breast cancer, lung cancer, ovarian cancer, stomach cancer, and thyroid cancer. This medicine may be used for other purposes; ask your health care provider or pharmacist if you have questions. COMMON BRAND NAME(S): Adriamycin, Adriamycin PFS, Adriamycin RDF, Rubex What should I tell my care team before I take this medication? They need to know if you have any of these conditions: heart disease history of low blood counts caused by a medicine liver disease recent or ongoing radiation therapy an unusual or allergic reaction to doxorubicin, other  chemotherapy agents, other medicines, foods, dyes, or preservatives pregnant or trying to get pregnant breast-feeding How should I use this medication? This drug is given as an infusion into a vein. It is administered in a hospital or clinic by a specially trained health care professional. If you have pain, swelling, burning or any unusual feeling around the site of your injection, tell your health care professional right away. Talk to your pediatrician regarding the use of this medicine in children. Special care may be needed. Overdosage: If you think you have taken too much of this medicine contact a poison control center or emergency room at once. NOTE: This medicine is only for you. Do not share this medicine with others. What if I miss a dose? It is important not to miss your dose. Call your doctor or health care professional if you are unable to keep an appointment. What may interact with this medication? This medicine may interact with the following medications: 6-mercaptopurine paclitaxel phenytoin St. John's Wort trastuzumab verapamil This list may not describe all possible interactions. Give your health care provider a list of all the medicines, herbs, non-prescription drugs, or dietary supplements you use. Also tell them if you smoke, drink alcohol, or use illegal drugs. Some items may interact with your medicine. What should I watch for while using this medication? This drug may make you feel generally unwell. This is not uncommon, as chemotherapy can affect healthy cells as well as cancer cells. Report any side effects. Continue your course of treatment even though you feel ill unless your doctor tells you to stop. There is a maximum amount of this medicine you should receive throughout your life. The amount depends on the medical condition being treated and your overall health. Your doctor will watch how much of this medicine you receive in your lifetime. Tell your doctor if you  have taken this medicine before. You may need blood work done while you are taking this medicine. Your urine may turn red for a few days after your dose. This is not blood. If your urine is dark or brown, call your doctor. In some cases, you may be given additional medicines to help with side effects. Follow all directions for their use. Call your doctor or health care professional for advice if you get a fever, chills or sore throat, or other symptoms of a cold or flu. Do not treat yourself. This drug decreases your body's ability to fight infections. Try to avoid being around people who are sick. This medicine may increase your risk to bruise or bleed. Call your doctor or health care professional if you notice any unusual bleeding. Talk to your doctor about your risk of cancer. You may be more at risk for certain types of cancers if you take this medicine. Do not become pregnant while taking this medicine or for 6 months after stopping it. Women should inform their doctor if they wish to become pregnant  or think they might be pregnant. Men should not father a child while taking this medicine and for 6 months after stopping it. There is a potential for serious side effects to an unborn child. Talk to your health care professional or pharmacist for more information. Do not breast-feed an infant while taking this medicine. This medicine has caused ovarian failure in some women and reduced sperm counts in some men This medicine may interfere with the ability to have a child. Talk with your doctor or health care professional if you are concerned about your fertility. This medicine may cause a decrease in Co-Enzyme Q-10. You should make sure that you get enough Co-Enzyme Q-10 while you are taking this medicine. Discuss the foods you eat and the vitamins you take with your health care professional. What side effects may I notice from receiving this medication? Side effects that you should report to your doctor  or health care professional as soon as possible: allergic reactions like skin rash, itching or hives, swelling of the face, lips, or tongue breathing problems chest pain fast or irregular heartbeat low blood counts - this medicine may decrease the number of white blood cells, red blood cells and platelets. You may be at increased risk for infections and bleeding. pain, redness, or irritation at site where injected signs of infection - fever or chills, cough, sore throat, pain or difficulty passing urine signs of decreased platelets or bleeding - bruising, pinpoint red spots on the skin, black, tarry stools, blood in the urine swelling of the ankles, feet, hands tiredness weakness Side effects that usually do not require medical attention (report to your doctor or health care professional if they continue or are bothersome): diarrhea hair loss mouth sores nail discoloration or damage nausea red colored urine vomiting This list may not describe all possible side effects. Call your doctor for medical advice about side effects. You may report side effects to FDA at 1-800-FDA-1088. Where should I keep my medication? This drug is given in a hospital or clinic and will not be stored at home. NOTE: This sheet is a summary. It may not cover all possible information. If you have questions about this medicine, talk to your doctor, pharmacist, or health care provider.  2023 Elsevier/Gold Standard (2017-07-18 00:00:00)

## 2022-05-26 DIAGNOSIS — E785 Hyperlipidemia, unspecified: Secondary | ICD-10-CM | POA: Diagnosis not present

## 2022-05-26 DIAGNOSIS — F419 Anxiety disorder, unspecified: Secondary | ICD-10-CM | POA: Diagnosis not present

## 2022-05-26 DIAGNOSIS — I129 Hypertensive chronic kidney disease with stage 1 through stage 4 chronic kidney disease, or unspecified chronic kidney disease: Secondary | ICD-10-CM | POA: Diagnosis not present

## 2022-05-26 DIAGNOSIS — Z951 Presence of aortocoronary bypass graft: Secondary | ICD-10-CM | POA: Diagnosis not present

## 2022-05-26 DIAGNOSIS — G9340 Encephalopathy, unspecified: Secondary | ICD-10-CM | POA: Diagnosis not present

## 2022-05-26 DIAGNOSIS — D631 Anemia in chronic kidney disease: Secondary | ICD-10-CM | POA: Diagnosis not present

## 2022-05-26 DIAGNOSIS — M1990 Unspecified osteoarthritis, unspecified site: Secondary | ICD-10-CM | POA: Diagnosis not present

## 2022-05-26 DIAGNOSIS — J42 Unspecified chronic bronchitis: Secondary | ICD-10-CM | POA: Diagnosis not present

## 2022-05-26 DIAGNOSIS — Z8616 Personal history of COVID-19: Secondary | ICD-10-CM | POA: Diagnosis not present

## 2022-05-26 DIAGNOSIS — Z87440 Personal history of urinary (tract) infections: Secondary | ICD-10-CM | POA: Diagnosis not present

## 2022-05-26 DIAGNOSIS — R599 Enlarged lymph nodes, unspecified: Secondary | ICD-10-CM | POA: Diagnosis not present

## 2022-05-26 DIAGNOSIS — J45909 Unspecified asthma, uncomplicated: Secondary | ICD-10-CM | POA: Diagnosis not present

## 2022-05-26 DIAGNOSIS — Z79891 Long term (current) use of opiate analgesic: Secondary | ICD-10-CM | POA: Diagnosis not present

## 2022-05-26 DIAGNOSIS — G459 Transient cerebral ischemic attack, unspecified: Secondary | ICD-10-CM | POA: Diagnosis not present

## 2022-05-26 DIAGNOSIS — Z8701 Personal history of pneumonia (recurrent): Secondary | ICD-10-CM | POA: Diagnosis not present

## 2022-05-26 DIAGNOSIS — N183 Chronic kidney disease, stage 3 unspecified: Secondary | ICD-10-CM | POA: Diagnosis not present

## 2022-05-26 DIAGNOSIS — C8590 Non-Hodgkin lymphoma, unspecified, unspecified site: Secondary | ICD-10-CM | POA: Diagnosis not present

## 2022-05-26 DIAGNOSIS — M069 Rheumatoid arthritis, unspecified: Secondary | ICD-10-CM | POA: Diagnosis not present

## 2022-05-28 ENCOUNTER — Inpatient Hospital Stay: Payer: Medicare HMO | Attending: Oncology

## 2022-05-28 VITALS — BP 126/86 | HR 98 | Temp 98.0°F | Resp 18 | Ht 62.5 in | Wt 128.0 lb

## 2022-05-28 DIAGNOSIS — C8118 Nodular sclerosis classical Hodgkin lymphoma, lymph nodes of multiple sites: Secondary | ICD-10-CM | POA: Diagnosis present

## 2022-05-28 DIAGNOSIS — Z5111 Encounter for antineoplastic chemotherapy: Secondary | ICD-10-CM | POA: Diagnosis present

## 2022-05-28 DIAGNOSIS — D649 Anemia, unspecified: Secondary | ICD-10-CM | POA: Diagnosis not present

## 2022-05-28 DIAGNOSIS — Z5189 Encounter for other specified aftercare: Secondary | ICD-10-CM | POA: Insufficient documentation

## 2022-05-28 DIAGNOSIS — Z79899 Other long term (current) drug therapy: Secondary | ICD-10-CM | POA: Diagnosis not present

## 2022-05-28 MED ORDER — PEGFILGRASTIM-JMDB 6 MG/0.6ML ~~LOC~~ SOSY
6.0000 mg | PREFILLED_SYRINGE | Freq: Once | SUBCUTANEOUS | Status: AC
  Administered 2022-05-28: 6 mg via SUBCUTANEOUS
  Filled 2022-05-28: qty 0.6

## 2022-05-28 NOTE — Patient Instructions (Signed)

## 2022-06-05 DIAGNOSIS — G9340 Encephalopathy, unspecified: Secondary | ICD-10-CM | POA: Diagnosis not present

## 2022-06-05 DIAGNOSIS — E1129 Type 2 diabetes mellitus with other diabetic kidney complication: Secondary | ICD-10-CM | POA: Diagnosis not present

## 2022-06-05 DIAGNOSIS — R000 Tachycardia, unspecified: Secondary | ICD-10-CM | POA: Diagnosis not present

## 2022-06-05 DIAGNOSIS — C8114 Nodular sclerosis classical Hodgkin lymphoma, lymph nodes of axilla and upper limb: Secondary | ICD-10-CM | POA: Diagnosis not present

## 2022-06-05 DIAGNOSIS — Z6822 Body mass index (BMI) 22.0-22.9, adult: Secondary | ICD-10-CM | POA: Diagnosis not present

## 2022-06-05 DIAGNOSIS — Z9181 History of falling: Secondary | ICD-10-CM | POA: Diagnosis not present

## 2022-06-05 DIAGNOSIS — N3001 Acute cystitis with hematuria: Secondary | ICD-10-CM | POA: Diagnosis not present

## 2022-06-05 DIAGNOSIS — R809 Proteinuria, unspecified: Secondary | ICD-10-CM | POA: Diagnosis not present

## 2022-06-06 NOTE — Progress Notes (Signed)
[facility]  [address] [place],  [place]  [address] [phone]  Clinic Day:  06/07/2022  Referring physician: Serita Grammes, MD  HISTORY OF PRESENT ILLNESS:  The patient is a 76 y.o. female with stage IIIA classical Hodgkin lymphoma.  She comes in today to be evaluated before receiving cycle 3, day 1 of Adriamycin/vinblastine/dacarbazine (AVD) chemotherapy.  The patient claims to be tolerating her AVD treatments much better since the doses were decreased by 25%.  However, she does get progressively weaker with each successive treatment. As it pertains to her Hodgkin lymphoma, she denies having any B symptoms or prominent lymphadenopathy which concerns her for refractory disease while on treatment.  PHYSICAL EXAM:  Blood pressure (!) 193/98, pulse 99, temperature 98.5 F (36.9 C), resp. rate 14, height 5' 2.5" (1.588 m), weight 129 lb 3.2 oz (58.6 kg), SpO2 98 %. Wt Readings from Last 3 Encounters:  06/07/22 129 lb 3.2 oz (58.6 kg)  05/28/22 128 lb (58.1 kg)  05/25/22 128 lb (58.1 kg)   Body mass index is 23.25 kg/m. Performance status (ECOG): 1 - Symptomatic but completely ambulatory Physical Exam Constitutional:      Appearance: Normal appearance.     Comments: Anxious, but is in better spirits.  HENT:     Mouth/Throat:     Pharynx: Oropharynx is clear. No oropharyngeal exudate.  Cardiovascular:     Rate and Rhythm: Normal rate and regular rhythm.     Heart sounds: No murmur heard.    No friction rub. No gallop.  Pulmonary:     Breath sounds: Normal breath sounds.  Chest:  Breasts:    Right: No swelling, bleeding, inverted nipple, mass, nipple discharge or skin change.     Left: No swelling, bleeding, inverted nipple, mass, nipple discharge or skin change.  Abdominal:     General: Bowel sounds are normal. There is no distension.     Palpations: Abdomen is soft. There is no mass.     Tenderness: There is no abdominal tenderness.   Musculoskeletal:        General: No tenderness.     Cervical back: Normal range of motion and neck supple.     Right lower leg: No edema.     Left lower leg: No edema.  Lymphadenopathy:     Cervical: No cervical adenopathy.     Right cervical: No superficial, deep or posterior cervical adenopathy.    Left cervical: No superficial, deep or posterior cervical adenopathy.     Upper Body:     Right upper body: No supraclavicular or axillary adenopathy.     Left upper body: No supraclavicular or axillary adenopathy.     Lower Body: No right inguinal adenopathy. No left inguinal adenopathy.  Skin:    Coloration: Skin is not jaundiced.     Findings: No lesion or rash.  Neurological:     General: No focal deficit present.     Mental Status: She is alert and oriented to person, place, and time. Mental status is at baseline.  Psychiatric:        Mood and Affect: Mood normal.        Behavior: Behavior normal.        Thought Content: Thought content normal.        Judgment: Judgment normal.   LABS:  Latest Reference Range & Units 06/07/22 00:00  Sodium 137 - 147  139 (E)  Potassium 3.5 - 5.1 mEq/L 4.5 (E)  Chloride 99 -  108  106 (E)  CO2 13 - 22  26 ! (E)  Glucose  111 (E)  BUN 4 - 21  15 (E)  Creatinine 0.5 - 1.1  0.8 (E)  Calcium 8.7 - 10.7  8.8 (E)  Alkaline Phosphatase 25 - 125  119 (E)  Albumin 3.5 - 5.0  3.8 (E)  AST 13 - 35  24 (E)  ALT 7 - 35 U/L 15 (E)  Bilirubin, Total  0.4 (E)  WBC  10.1 (E)  RBC 3.87 - 5.11  2.26 ! (E)  Hemoglobin 12.0 - 16.0  7.8 ! (E)  HCT 36 - 46  23 ! (E)  Platelets 150 - 400 K/uL 156 (E)  NEUT#  8.18 (E)  !: Data is abnormal (E): External lab result  ASSESSMENT & PLAN:  Assessment/Plan:  A 76 y.o. female with stage IIIA classical Hodgkin lymphoma, nodular sclerosis subtype.  As her hemoglobin is below 8, I will arrange for her to be transfused 2 units of blood over these next few days.  Her hemoglobin would not prevent her from proceeding with  her cycle 3, day 1 of AVD treatment.  Other than fatigue from her chemotherapy-based anemia, the patient appears to be doing okay.  I will see her back in 2 weeks before she has the cycle 3, day 15 of AVD.  Both the patient and her family understand all the plans discussed today and are in agreement with them.  Jekhi Bolin Macarthur Critchley, MD

## 2022-06-07 ENCOUNTER — Other Ambulatory Visit: Payer: Self-pay | Admitting: Hematology and Oncology

## 2022-06-07 ENCOUNTER — Inpatient Hospital Stay: Payer: Medicare HMO

## 2022-06-07 ENCOUNTER — Inpatient Hospital Stay (INDEPENDENT_AMBULATORY_CARE_PROVIDER_SITE_OTHER): Payer: Medicare HMO | Admitting: Oncology

## 2022-06-07 ENCOUNTER — Other Ambulatory Visit: Payer: Self-pay | Admitting: Pharmacist

## 2022-06-07 VITALS — BP 193/98 | HR 99 | Temp 98.5°F | Resp 14 | Ht 62.5 in | Wt 129.2 lb

## 2022-06-07 DIAGNOSIS — Z5189 Encounter for other specified aftercare: Secondary | ICD-10-CM | POA: Diagnosis not present

## 2022-06-07 DIAGNOSIS — Z79899 Other long term (current) drug therapy: Secondary | ICD-10-CM | POA: Diagnosis not present

## 2022-06-07 DIAGNOSIS — D649 Anemia, unspecified: Secondary | ICD-10-CM | POA: Diagnosis not present

## 2022-06-07 DIAGNOSIS — Z5111 Encounter for antineoplastic chemotherapy: Secondary | ICD-10-CM | POA: Diagnosis not present

## 2022-06-07 DIAGNOSIS — C8118 Nodular sclerosis classical Hodgkin lymphoma, lymph nodes of multiple sites: Secondary | ICD-10-CM | POA: Diagnosis not present

## 2022-06-07 DIAGNOSIS — D509 Iron deficiency anemia, unspecified: Secondary | ICD-10-CM

## 2022-06-07 LAB — COMPREHENSIVE METABOLIC PANEL
Albumin: 3.8 (ref 3.5–5.0)
Calcium: 8.8 (ref 8.7–10.7)

## 2022-06-07 LAB — BASIC METABOLIC PANEL
BUN: 15 (ref 4–21)
CO2: 26 — AB (ref 13–22)
Chloride: 106 (ref 99–108)
Creatinine: 0.8 (ref 0.5–1.1)
Glucose: 111
Potassium: 4.5 mEq/L (ref 3.5–5.1)
Sodium: 139 (ref 137–147)

## 2022-06-07 LAB — HEPATIC FUNCTION PANEL
ALT: 15 U/L (ref 7–35)
AST: 24 (ref 13–35)
Alkaline Phosphatase: 119 (ref 25–125)
Bilirubin, Total: 0.4

## 2022-06-07 LAB — CBC AND DIFFERENTIAL
HCT: 23 — AB (ref 36–46)
Hemoglobin: 7.8 — AB (ref 12.0–16.0)
Neutrophils Absolute: 8.18
Platelets: 156 10*3/uL (ref 150–400)
WBC: 10.1

## 2022-06-07 LAB — CBC: RBC: 2.26 — AB (ref 3.87–5.11)

## 2022-06-07 LAB — ABO/RH: ABO/RH(D): O POS

## 2022-06-07 LAB — PREPARE RBC (CROSSMATCH)

## 2022-06-07 MED FILL — Dacarbazine For Inj 200 MG: INTRAVENOUS | Qty: 47 | Status: AC

## 2022-06-07 MED FILL — Fosaprepitant Dimeglumine For IV Infusion 150 MG (Base Eq): INTRAVENOUS | Qty: 5 | Status: AC

## 2022-06-07 MED FILL — Vinblastine Sulfate Inj 1 MG/ML: INTRAVENOUS | Qty: 7.5 | Status: AC

## 2022-06-07 MED FILL — Doxorubicin HCl Inj 2 MG/ML: INTRAVENOUS | Qty: 16 | Status: AC

## 2022-06-08 ENCOUNTER — Encounter: Payer: Self-pay | Admitting: Oncology

## 2022-06-08 ENCOUNTER — Inpatient Hospital Stay: Payer: Medicare HMO

## 2022-06-08 VITALS — BP 122/64 | HR 96 | Temp 98.2°F | Resp 18

## 2022-06-08 DIAGNOSIS — Z5111 Encounter for antineoplastic chemotherapy: Secondary | ICD-10-CM | POA: Diagnosis not present

## 2022-06-08 DIAGNOSIS — Z5189 Encounter for other specified aftercare: Secondary | ICD-10-CM | POA: Diagnosis not present

## 2022-06-08 DIAGNOSIS — D509 Iron deficiency anemia, unspecified: Secondary | ICD-10-CM

## 2022-06-08 DIAGNOSIS — C8118 Nodular sclerosis classical Hodgkin lymphoma, lymph nodes of multiple sites: Secondary | ICD-10-CM | POA: Diagnosis not present

## 2022-06-08 DIAGNOSIS — Z79899 Other long term (current) drug therapy: Secondary | ICD-10-CM | POA: Diagnosis not present

## 2022-06-08 DIAGNOSIS — D649 Anemia, unspecified: Secondary | ICD-10-CM | POA: Diagnosis not present

## 2022-06-08 MED ORDER — VINBLASTINE SULFATE CHEMO INJECTION 1 MG/ML
4.5000 mg/m2 | Freq: Once | INTRAVENOUS | Status: AC
  Administered 2022-06-08: 7.5 mg via INTRAVENOUS
  Filled 2022-06-08: qty 7.5

## 2022-06-08 MED ORDER — PALONOSETRON HCL INJECTION 0.25 MG/5ML
0.2500 mg | Freq: Once | INTRAVENOUS | Status: AC
  Administered 2022-06-08: 0.25 mg via INTRAVENOUS
  Filled 2022-06-08: qty 5

## 2022-06-08 MED ORDER — DIPHENHYDRAMINE HCL 25 MG PO CAPS: 25.0000 mg | ORAL_CAPSULE | Freq: Once | ORAL | Status: DC

## 2022-06-08 MED ORDER — SODIUM CHLORIDE 0.9 % IV SOLN
150.0000 mg | Freq: Once | INTRAVENOUS | Status: AC
  Administered 2022-06-08: 150 mg via INTRAVENOUS
  Filled 2022-06-08: qty 150

## 2022-06-08 MED ORDER — DEXAMETHASONE SODIUM PHOSPHATE 10 MG/ML IJ SOLN
4.0000 mg | Freq: Once | INTRAMUSCULAR | Status: AC
  Administered 2022-06-08: 4 mg via INTRAVENOUS
  Filled 2022-06-08: qty 1

## 2022-06-08 MED ORDER — SODIUM CHLORIDE 0.9% IV SOLUTION
250.0000 mL | Freq: Once | INTRAVENOUS | Status: AC
  Administered 2022-06-08: 250 mL via INTRAVENOUS

## 2022-06-08 MED ORDER — HEPARIN SOD (PORK) LOCK FLUSH 100 UNIT/ML IV SOLN
500.0000 [IU] | Freq: Every day | INTRAVENOUS | Status: AC | PRN
  Administered 2022-06-08: 500 [IU]

## 2022-06-08 MED ORDER — ACETAMINOPHEN 325 MG PO TABS: 650.0000 mg | ORAL_TABLET | Freq: Once | ORAL | Status: DC

## 2022-06-08 MED ORDER — SODIUM CHLORIDE 0.9 % IV SOLN
281.2500 mg/m2 | Freq: Once | INTRAVENOUS | Status: AC
  Administered 2022-06-08: 470 mg via INTRAVENOUS
  Filled 2022-06-08: qty 47

## 2022-06-08 MED ORDER — LORAZEPAM 2 MG/ML IJ SOLN: 0.5000 mg | Freq: Once | INTRAMUSCULAR | Status: DC | PRN

## 2022-06-08 MED ORDER — SODIUM CHLORIDE 0.9 % IV SOLN: Freq: Once | INTRAVENOUS | Status: AC

## 2022-06-08 MED ORDER — DOXORUBICIN HCL CHEMO IV INJECTION 2 MG/ML
18.7500 mg/m2 | Freq: Once | INTRAVENOUS | Status: AC
  Administered 2022-06-08: 32 mg via INTRAVENOUS
  Filled 2022-06-08: qty 16

## 2022-06-08 MED ORDER — SODIUM CHLORIDE 0.9% FLUSH
10.0000 mL | INTRAVENOUS | Status: AC | PRN
  Administered 2022-06-08: 10 mL

## 2022-06-08 NOTE — Patient Instructions (Signed)
Dacarbazine, DTIC injection What is this medication? DACARBAZINE (da KAR ba zeen) is a chemotherapy drug. This medicine is used to treat skin cancer. It is also used with other medicines to treat Hodgkin's disease. This medicine may be used for other purposes; ask your health care provider or pharmacist if you have questions. COMMON BRAND NAME(S): DTIC-Dome What should I tell my care team before I take this medication? They need to know if you have any of these conditions: infection (especially virus infection such as chickenpox, cold sores, or herpes) kidney disease liver disease low blood counts like low platelets, red blood cells, white blood cells recent radiation therapy an unusual or allergic reaction to dacarbazine, other chemotherapy agents, other medicines, foods, dyes, or preservatives pregnant or trying to get pregnant breast-feeding How should I use this medication? This drug is given as an injection or infusion into a vein. It is administered in a hospital or clinic by a specially trained health care professional. Talk to your pediatrician regarding the use of this medicine in children. While this drug may be prescribed for selected conditions, precautions do apply. Overdosage: If you think you have taken too much of this medicine contact a poison control center or emergency room at once. NOTE: This medicine is only for you. Do not share this medicine with others. What if I miss a dose? It is important not to miss your dose. Call your doctor or health care professional if you are unable to keep an appointment. What may interact with this medication? medicines to increase blood counts like filgrastim, pegfilgrastim, sargramostim vaccines This list may not describe all possible interactions. Give your health care provider a list of all the medicines, herbs, non-prescription drugs, or dietary supplements you use. Also tell them if you smoke, drink alcohol, or use illegal drugs.  Some items may interact with your medicine. What should I watch for while using this medication? Your condition will be monitored carefully while you are receiving this medicine. You will need important blood work done while you are taking this medicine. This drug may make you feel generally unwell. This is not uncommon, as chemotherapy can affect healthy cells as well as cancer cells. Report any side effects. Continue your course of treatment even though you feel ill unless your doctor tells you to stop. Call your doctor or health care professional for advice if you get a fever, chills or sore throat, or other symptoms of a cold or flu. Do not treat yourself. This drug decreases your body's ability to fight infections. Try to avoid being around people who are sick. This medicine may increase your risk to bruise or bleed. Call your doctor or health care professional if you notice any unusual bleeding. Talk to your doctor about your risk of cancer. You may be more at risk for certain types of cancers if you take this medicine. Do not become pregnant while taking this medicine. Women should inform their doctor if they wish to become pregnant or think they might be pregnant. There is a potential for serious side effects to an unborn child. Talk to your health care professional or pharmacist for more information. Do not breast-feed an infant while taking this medicine. What side effects may I notice from receiving this medication? Side effects that you should report to your doctor or health care professional as soon as possible: allergic reactions like skin rash, itching or hives, swelling of the face, lips, or tongue low blood counts - this medicine may decrease  the number of white blood cells, red blood cells and platelets. You may be at increased risk for infections and bleeding. signs of infection - fever or chills, cough, sore throat, pain or difficulty passing urine signs of decreased platelets or  bleeding - bruising, pinpoint red spots on the skin, black, tarry stools, blood in the urine signs of decreased red blood cells - unusually weak or tired, fainting spells, lightheadedness breathing problems muscle pains pain at site where injected trouble passing urine or change in the amount of urine vomiting yellowing of the eyes or skin Side effects that usually do not require medical attention (report to your doctor or health care professional if they continue or are bothersome): diarrhea hair loss loss of appetite nausea skin more sensitive to sun or ultraviolet light stomach upset This list may not describe all possible side effects. Call your doctor for medical advice about side effects. You may report side effects to FDA at 1-800-FDA-1088. Where should I keep my medication? This drug is given in a hospital or clinic and will not be stored at home. NOTE: This sheet is a summary. It may not cover all possible information. If you have questions about this medicine, talk to your doctor, pharmacist, or health care provider.  2023 Elsevier/Gold Standard (2016-05-30 00:00:00) Vinblastine injection What is this medication? VINBLASTINE (vin BLAS teen) is a chemotherapy drug. It slows the growth of cancer cells. This medicine is used to treat many types of cancer like breast cancer, testicular cancer, Hodgkin's disease, non-Hodgkin's lymphoma, and sarcoma. This medicine may be used for other purposes; ask your health care provider or pharmacist if you have questions. COMMON BRAND NAME(S): Velban What should I tell my care team before I take this medication? They need to know if you have any of these conditions: blood disorders dental disease gout infection (especially a virus infection such as chickenpox, cold sores, or herpes) liver disease lung disease nervous system disease recent or ongoing radiation therapy an unusual or allergic reaction to vinblastine, other chemotherapy  agents, other medicines, foods, dyes, or preservatives pregnant or trying to get pregnant breast-feeding How should I use this medication? This drug is given as an infusion into a vein. It is administered in a hospital or clinic by a specially trained health care professional. If you have pain, swelling, burning or any unusual feeling around the site of your injection, tell your health care professional right away. Talk to your pediatrician regarding the use of this medicine in children. While this drug may be prescribed for selected conditions, precautions do apply. Overdosage: If you think you have taken too much of this medicine contact a poison control center or emergency room at once. NOTE: This medicine is only for you. Do not share this medicine with others. What if I miss a dose? It is important not to miss your dose. Call your doctor or health care professional if you are unable to keep an appointment. What may interact with this medication? erythromycin certain medicines for fungal infections like itraconazole, ketoconazole, posaconazole, voriconazole certain medicines for seizures like phenytoin This list may not describe all possible interactions. Give your health care provider a list of all the medicines, herbs, non-prescription drugs, or dietary supplements you use. Also tell them if you smoke, drink alcohol, or use illegal drugs. Some items may interact with your medicine. What should I watch for while using this medication? Your condition will be monitored carefully while you are receiving this medicine. You will need important  blood work done while you are taking this medicine. This drug may make you feel generally unwell. This is not uncommon, as chemotherapy can affect healthy cells as well as cancer cells. Report any side effects. Continue your course of treatment even though you feel ill unless your doctor tells you to stop. In some cases, you may be given additional medicines  to help with side effects. Follow all directions for their use. Call your doctor or health care professional for advice if you get a fever, chills or sore throat, or other symptoms of a cold or flu. Do not treat yourself. This drug decreases your body's ability to fight infections. Try to avoid being around people who are sick. This medicine may increase your risk to bruise or bleed. Call your doctor or health care professional if you notice any unusual bleeding. Be careful brushing and flossing your teeth or using a toothpick because you may get an infection or bleed more easily. If you have any dental work done, tell your dentist you are receiving this medicine. Avoid taking products that contain aspirin, acetaminophen, ibuprofen, naproxen, or ketoprofen unless instructed by your doctor. These medicines may hide a fever. Do not become pregnant while taking this medicine. Women should inform their doctor if they wish to become pregnant or think they might be pregnant. There is a potential for serious side effects to an unborn child. Talk to your health care professional or pharmacist for more information. Do not breast-feed an infant while taking this medicine. Men may have a lower sperm count while taking this medicine. Talk to your doctor if you plan to father a child. What side effects may I notice from receiving this medication? Side effects that you should report to your doctor or health care professional as soon as possible: allergic reactions like skin rash, itching or hives, swelling of the face, lips, or tongue low blood counts - This drug may decrease the number of white blood cells, red blood cells and platelets. You may be at increased risk for infections and bleeding. signs of infection - fever or chills, cough, sore throat, pain or difficulty passing urine signs of decreased platelets or bleeding - bruising, pinpoint red spots on the skin, black, tarry stools, nosebleeds signs of  decreased red blood cells - unusually weak or tired, fainting spells, lightheadedness breathing problems changes in hearing change in the amount of urine chest pain high blood pressure mouth sores nausea and vomiting pain, swelling, redness or irritation at the injection site pain, tingling, numbness in the hands or feet problems with balance, dizziness seizures Side effects that usually do not require medical attention (report to your doctor or health care professional if they continue or are bothersome): constipation hair loss jaw pain loss of appetite sensitivity to light stomach pain tumor pain This list may not describe all possible side effects. Call your doctor for medical advice about side effects. You may report side effects to FDA at 1-800-FDA-1088. Where should I keep my medication? This drug is given in a hospital or clinic and will not be stored at home. NOTE: This sheet is a summary. It may not cover all possible information. If you have questions about this medicine, talk to your doctor, pharmacist, or health care provider.  2023 Elsevier/Gold Standard (2021-10-13 00:00:00) Doxorubicin injection What is this medication? DOXORUBICIN (dox oh ROO bi sin) is a chemotherapy drug. It is used to treat many kinds of cancer like leukemia, lymphoma, neuroblastoma, sarcoma, and Wilms' tumor. It  is also used to treat bladder cancer, breast cancer, lung cancer, ovarian cancer, stomach cancer, and thyroid cancer. This medicine may be used for other purposes; ask your health care provider or pharmacist if you have questions. COMMON BRAND NAME(S): Adriamycin, Adriamycin PFS, Adriamycin RDF, Rubex What should I tell my care team before I take this medication? They need to know if you have any of these conditions: heart disease history of low blood counts caused by a medicine liver disease recent or ongoing radiation therapy an unusual or allergic reaction to doxorubicin, other  chemotherapy agents, other medicines, foods, dyes, or preservatives pregnant or trying to get pregnant breast-feeding How should I use this medication? This drug is given as an infusion into a vein. It is administered in a hospital or clinic by a specially trained health care professional. If you have pain, swelling, burning or any unusual feeling around the site of your injection, tell your health care professional right away. Talk to your pediatrician regarding the use of this medicine in children. Special care may be needed. Overdosage: If you think you have taken too much of this medicine contact a poison control center or emergency room at once. NOTE: This medicine is only for you. Do not share this medicine with others. What if I miss a dose? It is important not to miss your dose. Call your doctor or health care professional if you are unable to keep an appointment. What may interact with this medication? This medicine may interact with the following medications: 6-mercaptopurine paclitaxel phenytoin St. John's Wort trastuzumab verapamil This list may not describe all possible interactions. Give your health care provider a list of all the medicines, herbs, non-prescription drugs, or dietary supplements you use. Also tell them if you smoke, drink alcohol, or use illegal drugs. Some items may interact with your medicine. What should I watch for while using this medication? This drug may make you feel generally unwell. This is not uncommon, as chemotherapy can affect healthy cells as well as cancer cells. Report any side effects. Continue your course of treatment even though you feel ill unless your doctor tells you to stop. There is a maximum amount of this medicine you should receive throughout your life. The amount depends on the medical condition being treated and your overall health. Your doctor will watch how much of this medicine you receive in your lifetime. Tell your doctor if you  have taken this medicine before. You may need blood work done while you are taking this medicine. Your urine may turn red for a few days after your dose. This is not blood. If your urine is dark or brown, call your doctor. In some cases, you may be given additional medicines to help with side effects. Follow all directions for their use. Call your doctor or health care professional for advice if you get a fever, chills or sore throat, or other symptoms of a cold or flu. Do not treat yourself. This drug decreases your body's ability to fight infections. Try to avoid being around people who are sick. This medicine may increase your risk to bruise or bleed. Call your doctor or health care professional if you notice any unusual bleeding. Talk to your doctor about your risk of cancer. You may be more at risk for certain types of cancers if you take this medicine. Do not become pregnant while taking this medicine or for 6 months after stopping it. Women should inform their doctor if they wish to become pregnant  or think they might be pregnant. Men should not father a child while taking this medicine and for 6 months after stopping it. There is a potential for serious side effects to an unborn child. Talk to your health care professional or pharmacist for more information. Do not breast-feed an infant while taking this medicine. This medicine has caused ovarian failure in some women and reduced sperm counts in some men This medicine may interfere with the ability to have a child. Talk with your doctor or health care professional if you are concerned about your fertility. This medicine may cause a decrease in Co-Enzyme Q-10. You should make sure that you get enough Co-Enzyme Q-10 while you are taking this medicine. Discuss the foods you eat and the vitamins you take with your health care professional. What side effects may I notice from receiving this medication? Side effects that you should report to your doctor  or health care professional as soon as possible: allergic reactions like skin rash, itching or hives, swelling of the face, lips, or tongue breathing problems chest pain fast or irregular heartbeat low blood counts - this medicine may decrease the number of white blood cells, red blood cells and platelets. You may be at increased risk for infections and bleeding. pain, redness, or irritation at site where injected signs of infection - fever or chills, cough, sore throat, pain or difficulty passing urine signs of decreased platelets or bleeding - bruising, pinpoint red spots on the skin, black, tarry stools, blood in the urine swelling of the ankles, feet, hands tiredness weakness Side effects that usually do not require medical attention (report to your doctor or health care professional if they continue or are bothersome): diarrhea hair loss mouth sores nail discoloration or damage nausea red colored urine vomiting This list may not describe all possible side effects. Call your doctor for medical advice about side effects. You may report side effects to FDA at 1-800-FDA-1088. Where should I keep my medication? This drug is given in a hospital or clinic and will not be stored at home. NOTE: This sheet is a summary. It may not cover all possible information. If you have questions about this medicine, talk to your doctor, pharmacist, or health care provider.  2023 Elsevier/Gold Standard (2017-07-18 00:00:00) Blood Transfusion, Adult A blood transfusion is a procedure in which you receive blood or a type of blood cell (blood component) through an IV. You may need a blood transfusion when your blood level is low. This may result from a bleeding disorder, illness, injury, or surgery. The blood may come from a donor. You may also be able to donate blood for yourself (autologous blood donation) before a planned surgery. The blood given in a transfusion is made up of different blood components.  You may receive: Red blood cells. These carry oxygen to the cells in the body. Platelets. These help your blood to clot. Plasma. This is the liquid part of your blood. It carries proteins and other substances throughout the body. White blood cells. These help you fight infections. If you have hemophilia or another clotting disorder, you may also receive other types of blood products. Tell a health care provider about: Any blood disorders you have. Any previous reactions you have had during a blood transfusion. Any allergies you have. All medicines you are taking, including vitamins, herbs, eye drops, creams, and over-the-counter medicines. Any surgeries you have had. Any medical conditions you have, including any recent fever or cold symptoms. Whether you are  pregnant or may be pregnant. What are the risks? Generally, this is a safe procedure. However, problems may occur. The most common problems include: A mild allergic reaction, such as red, swollen areas of skin (hives) and itching. Fever or chills. This may be the body's response to new blood cells received. This may occur during or up to 4 hours after the transfusion. More serious problems may include: Transfusion-associated circulatory overload (TACO), or too much fluid in the lungs. This may cause breathing problems. A serious allergic reaction, such as difficulty breathing or swelling around the face and lips. Transfusion-related acute lung injury (TRALI), which causes breathing difficulty and low oxygen in the blood. This can occur within hours of the transfusion or several days later. Iron overload. This can happen after receiving many blood transfusions over a period of time. Infection or virus being transmitted. This is rare because donated blood is carefully tested before it is given. Hemolytic transfusion reaction. This is rare. It happens when your body's defense system (immune system)tries to attack the new blood cells.  Symptoms may include fever, chills, nausea, low blood pressure, and low back or chest pain. Transfusion-associated graft-versus-host disease (TAGVHD). This is rare. It happens when donated cells attack your body's healthy tissues. What happens before the procedure? Medicines Ask your health care provider about: Changing or stopping your regular medicines. This is especially important if you are taking diabetes medicines or blood thinners. Taking medicines such as aspirin and ibuprofen. These medicines can thin your blood. Do not take these medicines unless your health care provider tells you to take them. Taking over-the-counter medicines, vitamins, herbs, and supplements. General instructions Follow instructions from your health care provider about eating and drinking restrictions. You will have a blood test to determine your blood type. This is necessary to know what kind of blood your body will accept and to match it to the donor blood. If you are going to have a planned surgery, you may be able to do an autologous blood donation. This may be done in case you need to have a transfusion. You will have your temperature, blood pressure, and pulse monitored before the transfusion. If you have had an allergic reaction to a transfusion in the past, you may be given medicine to help prevent a reaction. This medicine may be given to you by mouth (orally) or through an IV. Set aside time for the blood transfusion. This procedure generally takes 1-4 hours to complete. What happens during the procedure?  An IV will be inserted into one of your veins. The bag of donated blood will be attached to your IV. The blood will then enter through your vein. Your temperature, blood pressure, and pulse will be monitored regularly during the transfusion. This monitoring is done to detect early signs of a transfusion reaction. Tell your nurse right away if you have any of these symptoms during the  transfusion: Shortness of breath or trouble breathing. Chest or back pain. Fever or chills. Hives or itching. If you have any signs or symptoms of a reaction, your transfusion will be stopped and you may be given medicine. When the transfusion is complete, your IV will be removed. Pressure may be applied to the IV site for a few minutes. A bandage (dressing)will be applied. The procedure may vary among health care providers and hospitals. What happens after the procedure? Your temperature, blood pressure, pulse, breathing rate, and blood oxygen level will be monitored until you leave the hospital or clinic. Your blood  may be tested to see how you are responding to the transfusion. You may be warmed with fluids or blankets to maintain a normal body temperature. If you receive your blood transfusion in an outpatient setting, you will be told whom to contact to report any reactions. Where to find more information For more information on blood transfusions, visit the [employer]: redcross.org Summary A blood transfusion is a procedure in which you receive blood or a type of blood cell (blood component) through an IV. The blood you receive may come from a donor or be donated by yourself (autologous blood donation) before a planned surgery. The blood given in a transfusion is made up of different blood components. You may receive red blood cells, platelets, plasma, or white blood cells depending on the condition treated. Your temperature, blood pressure, and pulse will be monitored before, during, and after the transfusion. After the transfusion, your blood may be tested to see how your body has responded. This information is not intended to replace advice given to you by your health care provider. Make sure you discuss any questions you have with your health care provider. Document Revised: 09/17/2019 Document Reviewed: 05/07/2019 Elsevier Patient Education  [place]. Anemia  Anemia is a condition in which there is not enough red blood cells or hemoglobin in the blood. Hemoglobin is a substance in red blood cells that carries oxygen. When you do not have enough red blood cells or hemoglobin (are anemic), your body cannot get enough oxygen and your organs may not work properly. As a result, you may feel very tired or have other problems. What are the causes? Common causes of anemia include: Excessive bleeding. Anemia can be caused by excessive bleeding inside or outside the body, including bleeding from the intestines or from heavy menstrual periods in females. Poor nutrition. Long-lasting (chronic) kidney, thyroid, and liver disease. Bone marrow disorders, spleen problems, and blood disorders. Cancer and treatments for cancer. HIV (human immunodeficiency virus) and AIDS (acquired immunodeficiency syndrome). Infections, medicines, and autoimmune disorders that destroy red blood cells. What are the signs or symptoms? Symptoms of this condition include: Minor weakness. Dizziness. Headache, or difficulties concentrating and sleeping. Heartbeats that feel irregular or faster than normal (palpitations). Shortness of breath, especially with exercise. Pale skin, lips, and nails, or cold hands and feet. Indigestion and nausea. Symptoms may occur suddenly or develop slowly. If your anemia is mild, you may not have symptoms. How is this diagnosed? This condition is diagnosed based on blood tests, your medical history, and a physical exam. In some cases, a test may be needed in which cells are removed from the soft tissue inside of a bone and looked at under a microscope (bone marrow biopsy). Your health care provider may also check your stool (feces) for blood and may do additional testing to look for the cause of your bleeding. Other tests may include: Imaging tests, such as a CT scan or MRI. A procedure to see inside your esophagus and stomach  (endoscopy). A procedure to see inside your colon and rectum (colonoscopy). How is this treated? Treatment for this condition depends on the cause. If you continue to lose a lot of blood, you may need to be treated at a hospital. Treatment may include: Taking supplements of iron, vitamin A26, or folic acid. Taking a hormone medicine (erythropoietin) that can help to stimulate red blood cell growth. Having a blood transfusion. This may be needed if you lose a lot of blood.  Making changes to your diet. Having surgery to remove your spleen. Follow these instructions at home: Take over-the-counter and prescription medicines only as told by your health care provider. Take supplements only as told by your health care provider. Follow any diet instructions that you were given by your health care provider. Keep all follow-up visits as told by your health care provider. This is important. Contact a health care provider if: You develop new bleeding anywhere in the body. Get help right away if: You are very weak. You are short of breath. You have pain in your abdomen or chest. You are dizzy or feel faint. You have trouble concentrating. You have bloody stools, black stools, or tarry stools. You vomit repeatedly or you vomit up blood. These symptoms may represent a serious problem that is an emergency. Do not wait to see if the symptoms will go away. Get medical help right away. Call your local emergency services (911 in the [place] Do not drive yourself to the hospital. Summary Anemia is a condition in which you do not have enough red blood cells or enough of a substance in your red blood cells that carries oxygen (hemoglobin). Symptoms may occur suddenly or develop slowly. If your anemia is mild, you may not have symptoms. This condition is diagnosed with blood tests, a medical history, and a physical exam. Other tests may be needed. Treatment for this condition depends on the cause of the  anemia. This information is not intended to replace advice given to you by your health care provider. Make sure you discuss any questions you have with your health care provider. Document Revised: 09/26/2021 Document Reviewed: 10/20/2019 Elsevier Patient Education  [place].

## 2022-06-09 LAB — TYPE AND SCREEN
ABO/RH(D): O POS
Antibody Screen: NEGATIVE
Unit division: 0

## 2022-06-09 LAB — BPAM RBC
Blood Product Expiration Date: 202308032359
ISSUE DATE / TIME: 202307140739
Unit Type and Rh: 5100

## 2022-06-11 ENCOUNTER — Other Ambulatory Visit: Payer: Self-pay | Admitting: Oncology

## 2022-06-11 ENCOUNTER — Inpatient Hospital Stay: Payer: Medicare HMO

## 2022-06-11 ENCOUNTER — Other Ambulatory Visit: Payer: Medicare HMO

## 2022-06-11 VITALS — BP 130/60 | HR 95 | Temp 98.0°F | Resp 18 | Ht 62.5 in | Wt 130.0 lb

## 2022-06-11 DIAGNOSIS — C8118 Nodular sclerosis classical Hodgkin lymphoma, lymph nodes of multiple sites: Secondary | ICD-10-CM | POA: Diagnosis not present

## 2022-06-11 DIAGNOSIS — Z5111 Encounter for antineoplastic chemotherapy: Secondary | ICD-10-CM | POA: Diagnosis not present

## 2022-06-11 DIAGNOSIS — Z5189 Encounter for other specified aftercare: Secondary | ICD-10-CM | POA: Diagnosis not present

## 2022-06-11 DIAGNOSIS — Z79899 Other long term (current) drug therapy: Secondary | ICD-10-CM | POA: Diagnosis not present

## 2022-06-11 DIAGNOSIS — D649 Anemia, unspecified: Secondary | ICD-10-CM | POA: Diagnosis not present

## 2022-06-11 DIAGNOSIS — D6481 Anemia due to antineoplastic chemotherapy: Secondary | ICD-10-CM

## 2022-06-11 LAB — PREPARE RBC (CROSSMATCH)

## 2022-06-11 MED ORDER — ACETAMINOPHEN 325 MG PO TABS: 650.0000 mg | ORAL_TABLET | Freq: Once | ORAL | Status: DC

## 2022-06-11 MED ORDER — DIPHENHYDRAMINE HCL 25 MG PO CAPS: 25.0000 mg | ORAL_CAPSULE | Freq: Once | ORAL | Status: DC

## 2022-06-11 MED ORDER — PEGFILGRASTIM-JMDB 6 MG/0.6ML ~~LOC~~ SOSY
6.0000 mg | PREFILLED_SYRINGE | Freq: Once | SUBCUTANEOUS | Status: AC
  Administered 2022-06-11: 6 mg via SUBCUTANEOUS
  Filled 2022-06-11: qty 0.6

## 2022-06-11 NOTE — Patient Instructions (Signed)

## 2022-06-12 ENCOUNTER — Inpatient Hospital Stay: Payer: Medicare HMO

## 2022-06-12 DIAGNOSIS — T451X5A Adverse effect of antineoplastic and immunosuppressive drugs, initial encounter: Secondary | ICD-10-CM

## 2022-06-12 DIAGNOSIS — Z5189 Encounter for other specified aftercare: Secondary | ICD-10-CM | POA: Diagnosis not present

## 2022-06-12 DIAGNOSIS — Z79899 Other long term (current) drug therapy: Secondary | ICD-10-CM | POA: Diagnosis not present

## 2022-06-12 DIAGNOSIS — C8118 Nodular sclerosis classical Hodgkin lymphoma, lymph nodes of multiple sites: Secondary | ICD-10-CM | POA: Diagnosis not present

## 2022-06-12 DIAGNOSIS — Z5111 Encounter for antineoplastic chemotherapy: Secondary | ICD-10-CM | POA: Diagnosis not present

## 2022-06-12 DIAGNOSIS — D649 Anemia, unspecified: Secondary | ICD-10-CM | POA: Diagnosis not present

## 2022-06-12 MED ORDER — HEPARIN SOD (PORK) LOCK FLUSH 100 UNIT/ML IV SOLN
500.0000 [IU] | Freq: Every day | INTRAVENOUS | Status: AC | PRN
  Administered 2022-06-12: 500 [IU]

## 2022-06-12 MED ORDER — SODIUM CHLORIDE 0.9% IV SOLUTION
250.0000 mL | Freq: Once | INTRAVENOUS | Status: AC
  Administered 2022-06-12: 250 mL via INTRAVENOUS

## 2022-06-12 MED ORDER — ACETAMINOPHEN 325 MG PO TABS: 650.0000 mg | ORAL_TABLET | Freq: Once | ORAL | Status: DC

## 2022-06-12 MED ORDER — SODIUM CHLORIDE 0.9% FLUSH
10.0000 mL | INTRAVENOUS | Status: AC | PRN
  Administered 2022-06-12: 10 mL

## 2022-06-12 MED ORDER — DIPHENHYDRAMINE HCL 25 MG PO CAPS: 25.0000 mg | ORAL_CAPSULE | Freq: Once | ORAL | Status: DC

## 2022-06-12 MED ORDER — SODIUM CHLORIDE 0.9% FLUSH: 3.0000 mL | INTRAVENOUS | Status: DC | PRN

## 2022-06-13 LAB — TYPE AND SCREEN
ABO/RH(D): O POS
Antibody Screen: NEGATIVE
Unit division: 0

## 2022-06-13 LAB — BPAM RBC
Blood Product Expiration Date: 202308142359
ISSUE DATE / TIME: 202307180949
Unit Type and Rh: 5100

## 2022-06-18 ENCOUNTER — Other Ambulatory Visit: Payer: Self-pay

## 2022-06-21 ENCOUNTER — Encounter: Payer: Self-pay | Admitting: Hematology and Oncology

## 2022-06-21 ENCOUNTER — Inpatient Hospital Stay: Payer: Medicare HMO | Admitting: Hematology and Oncology

## 2022-06-21 ENCOUNTER — Inpatient Hospital Stay: Payer: Medicare HMO

## 2022-06-21 DIAGNOSIS — C8118 Nodular sclerosis classical Hodgkin lymphoma, lymph nodes of multiple sites: Secondary | ICD-10-CM | POA: Diagnosis not present

## 2022-06-21 LAB — COMPREHENSIVE METABOLIC PANEL
Albumin: 3.9 (ref 3.5–5.0)
Calcium: 9.3 (ref 8.7–10.7)

## 2022-06-21 LAB — BASIC METABOLIC PANEL
BUN: 19 (ref 4–21)
CO2: 24 — AB (ref 13–22)
Chloride: 106 (ref 99–108)
Creatinine: 0.8 (ref 0.5–1.1)
Glucose: 142
Potassium: 4.8 mEq/L (ref 3.5–5.1)
Sodium: 138 (ref 137–147)

## 2022-06-21 LAB — CBC: RBC: 3.21 — AB (ref 3.87–5.11)

## 2022-06-21 LAB — HEPATIC FUNCTION PANEL
ALT: 17 U/L (ref 7–35)
AST: 25 (ref 13–35)
Alkaline Phosphatase: 103 (ref 25–125)
Bilirubin, Total: 0.5

## 2022-06-21 LAB — CBC AND DIFFERENTIAL
HCT: 32 — AB (ref 36–46)
Hemoglobin: 10.6 — AB (ref 12.0–16.0)
Neutrophils Absolute: 5.37
Platelets: 115 10*3/uL — AB (ref 150–400)
WBC: 6.8

## 2022-06-21 MED FILL — Doxorubicin HCl Inj 2 MG/ML: INTRAVENOUS | Qty: 16 | Status: AC

## 2022-06-21 MED FILL — Vinblastine Sulfate Inj 1 MG/ML: INTRAVENOUS | Qty: 7.5 | Status: AC

## 2022-06-21 MED FILL — Dacarbazine For Inj 200 MG: INTRAVENOUS | Qty: 47 | Status: AC

## 2022-06-21 MED FILL — Fosaprepitant Dimeglumine For IV Infusion 150 MG (Base Eq): INTRAVENOUS | Qty: 5 | Status: AC

## 2022-06-21 NOTE — Assessment & Plan Note (Signed)
A 76 y.o. female with stage IIIA classical Hodgkin lymphoma, nodular sclerosis subtype. She is currently undergoing treatment with dacarbazine, doxorubicin and vinblastine. She is due Day 15, cycle 5 this week. She received blood transfusion last week and feels much better today. Hemoglobin is 10.6 today. She will proceed with treatment and return to clinic in 2 weeks for repeat evaluation.

## 2022-06-21 NOTE — Progress Notes (Signed)
Patient Care Team: Serita Grammes, MD as PCP - General (Family Medicine) Marice Potter, MD as Consulting Physician (Hematology and Oncology)  Clinic Day:  06/21/2022  Referring physician: Serita Grammes, MD  ASSESSMENT & PLAN:   Assessment & Plan: Hodgkin lymphoma of lymph nodes of multiple regions [facility]) A 76 y.o. female with stage IIIA classical Hodgkin lymphoma, nodular sclerosis subtype. She is currently undergoing treatment with dacarbazine, doxorubicin and vinblastine. She is due Day 15, cycle 5 this week. She received blood transfusion last week and feels much better today. Hemoglobin is 10.6 today. She will proceed with treatment and return to clinic in 2 weeks for repeat evaluation.     The patient understands the plans discussed today and is in agreement with them.  She knows to contact our office if she develops concerns prior to her next appointment.    Melodye Ped, NP  [facility] [address] [place] [address] Dept: [phone] Dept Fax: [phone]   Orders Placed This Encounter  Procedures   CBC and differential    This external order was created through the Results Console.   CBC    This external order was created through the Results Console.   Basic metabolic panel    This external order was created through the Results Console.   Comprehensive metabolic panel    This external order was created through the Results Console.   Hepatic function panel    This external order was created through the Results Console.      CHIEF COMPLAINT:  CC: A 76 year old female with history of Hodgkin's lymphoma here for 2 week evaluation  Current Treatment:  A+AVD  INTERVAL HISTORY:  Kaydense is here today for repeat clinical assessment. She denies fevers or chills. She denies pain. Her appetite is good. Her weight has been stable.  I have reviewed the past medical history, past surgical  history, social history and family history with the patient and they are unchanged from previous note.  ALLERGIES:  is allergic to penicillins, phenazopyridine hcl, sulfa antibiotics, butorphanol, ciprofloxacin, and levofloxacin.  MEDICATIONS:  Current Outpatient Medications  Medication Sig Dispense Refill   albuterol (VENTOLIN HFA) 108 (90 BASE) MCG/ACT inhaler Inhale into the lungs every 6 (six) hours as needed for wheezing or shortness of breath. Inhale two puffs every four to six hours as needed.     benzonatate (TESSALON) 100 MG capsule Take 1 capsule (100 mg total) by mouth 3 (three) times daily as needed for cough. 30 capsule 1   Blood Glucose Monitoring Suppl (ONETOUCH VERIO FLEX SYSTEM) w/Device KIT CHECK BLOOD SUGAR ONCE A DAY     clotrimazole (MYCELEX) 10 MG troche Take by mouth.     dexamethasone (DECADRON) 4 MG tablet Take 2 tablets (8 mg total) by mouth daily. Take for 3 days after chemotherapy. Do not take until after brentuximab is completed 12 tablet 5   doxycycline (VIBRAMYCIN) 100 MG capsule Take 100 mg by mouth 2 (two) times daily.     escitalopram (LEXAPRO) 5 MG tablet Take 5 mg by mouth at bedtime.     estradiol (ESTRACE) 0.1 MG/GM vaginal cream Place 1 Applicatorful vaginally at bedtime.     famotidine (PEPCID) 20 MG tablet Take 20 mg by mouth 2 (two) times daily.     fluconazole (DIFLUCAN) 150 MG tablet Take 1 tablet (150 mg total) by mouth daily. 2 tablet 0   fluconazole (DIFLUCAN) 50 MG tablet  Take 100 mg by mouth daily.     glucose blood (ONETOUCH ULTRA) test strip      hydroxychloroquine (PLAQUENIL) 200 MG tablet Take 200 mg by mouth daily.     hydroxychloroquine (PLAQUENIL) 200 MG tablet Take 1 tablet by mouth daily.     ketorolac (ACULAR) 0.5 % ophthalmic solution SMARTSIG:In Eye(s)     Lancets (ONETOUCH DELICA PLUS [id number]) MISC Apply topically 2 (two) times daily.     lidocaine (XYLOCAINE) 2 % solution SMARTSIG:15 Milliliter(s) By Mouth Every 3 Hours      lidocaine-prilocaine (EMLA) cream Apply to affected area once 30 g 3   LORazepam (ATIVAN) 1 MG tablet Take 1 tablet (1 mg total) by mouth every 8 (eight) hours. 30 tablet 0   mupirocin ointment (BACTROBAN) 2 % Apply topically.     nitrofurantoin, macrocrystal-monohydrate, (MACROBID) 100 MG capsule Take 1 capsule (100 mg total) by mouth 2 (two) times daily. With food 14 capsule 0   nystatin (MYCOSTATIN) 100000 UNIT/ML suspension Take 5 mLs (500,000 Units total) by mouth 4 (four) times daily. 473 mL 0   omeprazole (PRILOSEC) 40 MG capsule Take by mouth.     ondansetron (ZOFRAN) 8 MG tablet Take by mouth.     oxyCODONE (OXY IR/ROXICODONE) 5 MG immediate release tablet Take 1 tablet (5 mg total) by mouth every 4 (four) hours as needed for severe pain. 120 tablet 0   pantoprazole (PROTONIX) 40 MG tablet Take 40 mg by mouth 2 (two) times daily.     PERCOCET 5-325 MG tablet SMARTSIG:1 Each By Mouth Every 4 Hours     pioglitazone (ACTOS) 30 MG tablet Take by mouth.     prochlorperazine (COMPAZINE) 10 MG tablet Take 1 tablet (10 mg total) by mouth every 6 (six) hours as needed (Nausea or vomiting). 30 tablet 1   promethazine (PHENERGAN) 25 MG tablet Take 25 mg by mouth every 8 (eight) hours as needed.     promethazine-dextromethorphan (PROMETHAZINE-DM) 6.25-15 MG/5ML syrup TAKE 5 MILLILITER AT BEDTIME AS NEEDED FOR COUGH     traMADol (ULTRAM) 50 MG tablet Take 1 tablet (50 mg total) by mouth every 6 (six) hours as needed. 20 tablet 0   No current facility-administered medications for this visit.    HISTORY OF PRESENT ILLNESS:   Oncology History  Hodgkin lymphoma of lymph nodes of multiple regions ([place])  01/18/2022 Initial Diagnosis   Hodgkin lymphoma of lymph nodes of multiple regions ([place])   02/01/2022 -  Chemotherapy   Patient is on Treatment Plan :  HODGKINS LYMPHOMA Sequential A + AVD (Brentuximab q21d / Doxorubicin + Vinblastine + Dacarbazine q28d)          REVIEW OF SYSTEMS:    Constitutional: Denies fevers, chills or abnormal weight loss Eyes: Denies blurriness of vision Ears, nose, mouth, throat, and face: Denies mucositis or sore throat Respiratory: Denies cough, dyspnea or wheezes Cardiovascular: Denies palpitation, chest discomfort or lower extremity swelling Gastrointestinal:  Denies nausea, heartburn or change in bowel habits Skin: Denies abnormal skin rashes Lymphatics: Denies new lymphadenopathy or easy bruising Neurological:Denies numbness, tingling or new weaknesses Behavioral/Psych: Mood is stable, no new changes  All other systems were reviewed with the patient and are negative.   VITALS:  Blood pressure (!) 173/76, pulse (!) 101, temperature 98.5 F (36.9 C), resp. rate 14, height 5' 2.5" (1.588 m), weight 128 lb 11.2 oz (58.4 kg), SpO2 99 %.  Wt Readings from Last 3 Encounters:  06/21/22 128 lb 11.2 oz (58.4 kg)  06/11/22 130 lb (59 kg)  06/07/22 129 lb 3.2 oz (58.6 kg)    Body mass index is 23.16 kg/m.  Performance status (ECOG): 1 - Symptomatic but completely ambulatory  PHYSICAL EXAM:   GENERAL:alert, no distress and comfortable SKIN: skin color, texture, turgor are normal, no rashes or significant lesions EYES: normal, Conjunctiva are pink and non-injected, sclera clear OROPHARYNX:no exudate, no erythema and lips, buccal mucosa, and tongue normal  NECK: supple, thyroid normal size, non-tender, without nodularity LYMPH:  no palpable lymphadenopathy in the cervical, axillary or inguinal LUNGS: clear to auscultation and percussion with normal breathing effort HEART: regular rate & rhythm and no murmurs and no lower extremity edema ABDOMEN:abdomen soft, non-tender and normal bowel sounds Musculoskeletal:no cyanosis of digits and no clubbing  NEURO: alert & oriented x 3 with fluent speech, no focal motor/sensory deficits  LABORATORY DATA:  I have reviewed the data as listed    Component Value Date/Time   NA 138 06/21/2022 0000    K 4.8 06/21/2022 0000   CL 106 06/21/2022 0000   CO2 24 (A) 06/21/2022 0000   GLUCOSE 124 (H) 07/03/2019 1400   BUN 19 06/21/2022 0000   CREATININE 0.8 06/21/2022 0000   CREATININE 1.23 (H) 07/03/2019 1400   CALCIUM 9.3 06/21/2022 0000   ALBUMIN 3.9 06/21/2022 0000   AST 25 06/21/2022 0000   ALT 17 06/21/2022 0000   ALKPHOS 103 06/21/2022 0000   GFRNONAA 44 (L) 07/03/2019 1400   GFRAA 51 (L) 07/03/2019 1400    No results found for: "SPEP", "UPEP"  Lab Results  Component Value Date   WBC 6.8 06/21/2022   NEUTROABS 5.37 06/21/2022   HGB 10.6 (A) 06/21/2022   HCT 32 (A) 06/21/2022   MCV 94 03/19/2022   PLT 115 (A) 06/21/2022      Chemistry      Component Value Date/Time   NA 138 06/21/2022 0000   K 4.8 06/21/2022 0000   CL 106 06/21/2022 0000   CO2 24 (A) 06/21/2022 0000   BUN 19 06/21/2022 0000   CREATININE 0.8 06/21/2022 0000   CREATININE 1.23 (H) 07/03/2019 1400   GLU 142 06/21/2022 0000      Component Value Date/Time   CALCIUM 9.3 06/21/2022 0000   ALKPHOS 103 06/21/2022 0000   AST 25 06/21/2022 0000   ALT 17 06/21/2022 0000       RADIOGRAPHIC STUDIES: I have personally reviewed the radiological images as listed and agreed with the findings in the report. No results found.

## 2022-06-22 ENCOUNTER — Other Ambulatory Visit: Payer: Self-pay

## 2022-06-22 ENCOUNTER — Inpatient Hospital Stay: Payer: Medicare HMO

## 2022-06-22 VITALS — BP 141/82 | HR 91 | Temp 98.2°F | Resp 18 | Wt 129.0 lb

## 2022-06-22 DIAGNOSIS — D649 Anemia, unspecified: Secondary | ICD-10-CM | POA: Diagnosis not present

## 2022-06-22 DIAGNOSIS — Z5189 Encounter for other specified aftercare: Secondary | ICD-10-CM | POA: Diagnosis not present

## 2022-06-22 DIAGNOSIS — C8118 Nodular sclerosis classical Hodgkin lymphoma, lymph nodes of multiple sites: Secondary | ICD-10-CM | POA: Diagnosis not present

## 2022-06-22 DIAGNOSIS — Z79899 Other long term (current) drug therapy: Secondary | ICD-10-CM | POA: Diagnosis not present

## 2022-06-22 DIAGNOSIS — Z5111 Encounter for antineoplastic chemotherapy: Secondary | ICD-10-CM | POA: Diagnosis not present

## 2022-06-22 MED ORDER — SODIUM CHLORIDE 0.9 % IV SOLN
281.2500 mg/m2 | Freq: Once | INTRAVENOUS | Status: AC
  Administered 2022-06-22: 470 mg via INTRAVENOUS
  Filled 2022-06-22: qty 47

## 2022-06-22 MED ORDER — PALONOSETRON HCL INJECTION 0.25 MG/5ML
0.2500 mg | Freq: Once | INTRAVENOUS | Status: AC
  Administered 2022-06-22: 0.25 mg via INTRAVENOUS
  Filled 2022-06-22: qty 5

## 2022-06-22 MED ORDER — SODIUM CHLORIDE 0.9% FLUSH
10.0000 mL | INTRAVENOUS | Status: DC | PRN
  Administered 2022-06-22: 10 mL

## 2022-06-22 MED ORDER — SODIUM CHLORIDE 0.9 % IV SOLN
150.0000 mg | Freq: Once | INTRAVENOUS | Status: AC
  Administered 2022-06-22: 150 mg via INTRAVENOUS
  Filled 2022-06-22: qty 150

## 2022-06-22 MED ORDER — DEXAMETHASONE SODIUM PHOSPHATE 10 MG/ML IJ SOLN
4.0000 mg | Freq: Once | INTRAMUSCULAR | Status: AC
  Administered 2022-06-22: 4 mg via INTRAVENOUS
  Filled 2022-06-22: qty 1

## 2022-06-22 MED ORDER — VINBLASTINE SULFATE CHEMO INJECTION 1 MG/ML
4.5000 mg/m2 | Freq: Once | INTRAVENOUS | Status: AC
  Administered 2022-06-22: 7.5 mg via INTRAVENOUS
  Filled 2022-06-22: qty 7.5

## 2022-06-22 MED ORDER — LORAZEPAM 2 MG/ML IJ SOLN: 0.5000 mg | Freq: Once | INTRAMUSCULAR | Status: DC | PRN

## 2022-06-22 MED ORDER — HEPARIN SOD (PORK) LOCK FLUSH 100 UNIT/ML IV SOLN
500.0000 [IU] | Freq: Once | INTRAVENOUS | Status: AC | PRN
  Administered 2022-06-22: 500 [IU]

## 2022-06-22 MED ORDER — SODIUM CHLORIDE 0.9 % IV SOLN: Freq: Once | INTRAVENOUS | Status: AC

## 2022-06-22 MED ORDER — DOXORUBICIN HCL CHEMO IV INJECTION 2 MG/ML
18.7500 mg/m2 | Freq: Once | INTRAVENOUS | Status: AC
  Administered 2022-06-22: 32 mg via INTRAVENOUS
  Filled 2022-06-22: qty 16

## 2022-06-25 ENCOUNTER — Inpatient Hospital Stay: Payer: Medicare HMO

## 2022-06-25 VITALS — BP 123/53 | HR 98 | Temp 98.1°F | Resp 18 | Ht 62.5 in | Wt 128.1 lb

## 2022-06-25 DIAGNOSIS — Z5111 Encounter for antineoplastic chemotherapy: Secondary | ICD-10-CM | POA: Diagnosis not present

## 2022-06-25 DIAGNOSIS — D649 Anemia, unspecified: Secondary | ICD-10-CM | POA: Diagnosis not present

## 2022-06-25 DIAGNOSIS — C8118 Nodular sclerosis classical Hodgkin lymphoma, lymph nodes of multiple sites: Secondary | ICD-10-CM

## 2022-06-25 DIAGNOSIS — Z5189 Encounter for other specified aftercare: Secondary | ICD-10-CM | POA: Diagnosis not present

## 2022-06-25 DIAGNOSIS — Z79899 Other long term (current) drug therapy: Secondary | ICD-10-CM | POA: Diagnosis not present

## 2022-06-25 MED ORDER — PEGFILGRASTIM-JMDB 6 MG/0.6ML ~~LOC~~ SOSY
6.0000 mg | PREFILLED_SYRINGE | Freq: Once | SUBCUTANEOUS | Status: AC
  Administered 2022-06-25: 6 mg via SUBCUTANEOUS
  Filled 2022-06-25: qty 0.6

## 2022-06-25 NOTE — Patient Instructions (Signed)

## 2022-07-04 MED FILL — Vinblastine Sulfate Inj 1 MG/ML: INTRAVENOUS | Qty: 7.5 | Status: AC

## 2022-07-04 MED FILL — Dacarbazine For Inj 200 MG: INTRAVENOUS | Qty: 47 | Status: AC

## 2022-07-05 ENCOUNTER — Inpatient Hospital Stay: Payer: Medicare HMO | Attending: Oncology

## 2022-07-05 ENCOUNTER — Encounter: Payer: Self-pay | Admitting: Hematology and Oncology

## 2022-07-05 ENCOUNTER — Inpatient Hospital Stay: Payer: Medicare HMO | Admitting: Hematology and Oncology

## 2022-07-05 DIAGNOSIS — Z5111 Encounter for antineoplastic chemotherapy: Secondary | ICD-10-CM | POA: Insufficient documentation

## 2022-07-05 DIAGNOSIS — D6481 Anemia due to antineoplastic chemotherapy: Secondary | ICD-10-CM | POA: Insufficient documentation

## 2022-07-05 DIAGNOSIS — C8118 Nodular sclerosis classical Hodgkin lymphoma, lymph nodes of multiple sites: Secondary | ICD-10-CM

## 2022-07-05 DIAGNOSIS — Z5189 Encounter for other specified aftercare: Secondary | ICD-10-CM | POA: Insufficient documentation

## 2022-07-05 DIAGNOSIS — Z79899 Other long term (current) drug therapy: Secondary | ICD-10-CM | POA: Insufficient documentation

## 2022-07-05 LAB — CBC AND DIFFERENTIAL
HCT: 26 — AB (ref 36–46)
Hemoglobin: 8.9 — AB (ref 12.0–16.0)
Neutrophils Absolute: 5.53
Platelets: 110 10*3/uL — AB (ref 150–400)
WBC: 7

## 2022-07-05 LAB — BASIC METABOLIC PANEL
BUN: 17 (ref 4–21)
CO2: 26 — AB (ref 13–22)
Chloride: 107 (ref 99–108)
Creatinine: 0.8 (ref 0.5–1.1)
Glucose: 111
Potassium: 4.1 mEq/L (ref 3.5–5.1)
Sodium: 139 (ref 137–147)

## 2022-07-05 LAB — HEPATIC FUNCTION PANEL
ALT: 15 U/L (ref 7–35)
AST: 21 (ref 13–35)
Alkaline Phosphatase: 115 (ref 25–125)
Bilirubin, Total: 0.3

## 2022-07-05 LAB — COMPREHENSIVE METABOLIC PANEL
Albumin: 3.8 (ref 3.5–5.0)
Calcium: 8.9 (ref 8.7–10.7)

## 2022-07-05 LAB — CBC: RBC: 2.66 — AB (ref 3.87–5.11)

## 2022-07-05 MED FILL — Fosaprepitant Dimeglumine For IV Infusion 150 MG (Base Eq): INTRAVENOUS | Qty: 5 | Status: AC

## 2022-07-05 NOTE — Progress Notes (Signed)
Patient Care Team: Serita Grammes, MD as PCP - General (Family Medicine) Marice Potter, MD as Consulting Physician (Hematology and Oncology)  Clinic Day:  07/05/2022  Referring physician: Serita Grammes, MD  ASSESSMENT & PLAN:   Assessment & Plan: Hodgkin lymphoma of lymph nodes of multiple regions [facility]) A 76 y.o. female with stage IIIA classical Hodgkin lymphoma, nodular sclerosis subtype. She is currently undergoing treatment with dacarbazine, doxorubicin and vinblastine. She is due Day 1, cycle 6 this week. She is feeling well overall. She will return to clinic in 2 weeks for repeat evaluation with Dr. Bobby Rumpf.    The patient understands the plans discussed today and is in agreement with them.  She knows to contact our office if she develops concerns prior to her next appointment.    Melodye Ped, NP  [facility] [address] [place] [address] Dept: [phone] Dept Fax: [phone]   Orders Placed This Encounter  Procedures   CBC and differential    This external order was created through the Results Console.   CBC    This external order was created through the Results Console.   Basic metabolic panel    This external order was created through the Results Console.   Comprehensive metabolic panel    This external order was created through the Results Console.   Hepatic function panel    This external order was created through the Results Console.      CHIEF COMPLAINT:  CC: A 76 year old female with history of Hodgkin's Lymphoma here for 2 week evaluation  Current Treatment:  A + AVD  INTERVAL HISTORY:  Aniella is here today for repeat clinical assessment. She denies fevers or chills. She denies pain. Her appetite is good. Her weight has been stable.  I have reviewed the past medical history, past surgical history, social history and family history with the patient and they are  unchanged from previous note.  ALLERGIES:  is allergic to penicillins, phenazopyridine hcl, sulfa antibiotics, butorphanol, ciprofloxacin, and levofloxacin.  MEDICATIONS:  Current Outpatient Medications  Medication Sig Dispense Refill   albuterol (VENTOLIN HFA) 108 (90 BASE) MCG/ACT inhaler Inhale into the lungs every 6 (six) hours as needed for wheezing or shortness of breath. Inhale two puffs every four to six hours as needed.     benzonatate (TESSALON) 100 MG capsule Take 1 capsule (100 mg total) by mouth 3 (three) times daily as needed for cough. 30 capsule 1   Blood Glucose Monitoring Suppl (ONETOUCH VERIO FLEX SYSTEM) w/Device KIT CHECK BLOOD SUGAR ONCE A DAY     clotrimazole (MYCELEX) 10 MG troche Take by mouth.     estradiol (ESTRACE) 0.1 MG/GM vaginal cream Place 1 Applicatorful vaginally at bedtime.     famotidine (PEPCID) 20 MG tablet Take 20 mg by mouth 2 (two) times daily.     glucose blood (ONETOUCH ULTRA) test strip      ketorolac (ACULAR) 0.5 % ophthalmic solution SMARTSIG:In Eye(s)     Lancets (ONETOUCH DELICA PLUS [id number]) MISC Apply topically 2 (two) times daily.     lidocaine (XYLOCAINE) 2 % solution SMARTSIG:15 Milliliter(s) By Mouth Every 3 Hours     lidocaine-prilocaine (EMLA) cream Apply to affected area once 30 g 3   LORazepam (ATIVAN) 1 MG tablet Take 1 tablet (1 mg total) by mouth every 8 (eight) hours. 30 tablet 0   mupirocin ointment (BACTROBAN) 2 % Apply topically.  nystatin (MYCOSTATIN) 100000 UNIT/ML suspension Take 5 mLs (500,000 Units total) by mouth 4 (four) times daily. 473 mL 0   omeprazole (PRILOSEC) 40 MG capsule Take by mouth.     ondansetron (ZOFRAN) 8 MG tablet Take by mouth.     pantoprazole (PROTONIX) 40 MG tablet Take 40 mg by mouth 2 (two) times daily.     pioglitazone (ACTOS) 30 MG tablet Take by mouth.     prochlorperazine (COMPAZINE) 10 MG tablet Take 1 tablet (10 mg total) by mouth every 6 (six) hours as needed (Nausea or vomiting). 30  tablet 1   promethazine (PHENERGAN) 25 MG tablet Take 25 mg by mouth every 8 (eight) hours as needed.     promethazine-dextromethorphan (PROMETHAZINE-DM) 6.25-15 MG/5ML syrup TAKE 5 MILLILITER AT BEDTIME AS NEEDED FOR COUGH     traMADol (ULTRAM) 50 MG tablet Take 1 tablet (50 mg total) by mouth every 6 (six) hours as needed. 20 tablet 0   No current facility-administered medications for this visit.    HISTORY OF PRESENT ILLNESS:   Oncology History  Hodgkin lymphoma of lymph nodes of multiple regions ([place])  01/18/2022 Initial Diagnosis   Hodgkin lymphoma of lymph nodes of multiple regions ([place])   02/01/2022 -  Chemotherapy   Patient is on Treatment Plan :  HODGKINS LYMPHOMA Sequential A + AVD (Brentuximab q21d / Doxorubicin + Vinblastine + Dacarbazine q28d)          REVIEW OF SYSTEMS:   Constitutional: Denies fevers, chills or abnormal weight loss Eyes: Denies blurriness of vision Ears, nose, mouth, throat, and face: Denies mucositis or sore throat Respiratory: Denies cough, dyspnea or wheezes Cardiovascular: Denies palpitation, chest discomfort or lower extremity swelling Gastrointestinal:  Denies nausea, heartburn or change in bowel habits Skin: Denies abnormal skin rashes Lymphatics: Denies new lymphadenopathy or easy bruising Neurological:Denies numbness, tingling or new weaknesses Behavioral/Psych: Mood is stable, no new changes  All other systems were reviewed with the patient and are negative.   VITALS:  Blood pressure (!) 158/70, pulse 94, temperature 98.4 F (36.9 C), temperature source Oral, resp. rate 18, height 5' 2.5" (1.588 m), weight 128 lb 11.2 oz (58.4 kg), SpO2 98 %.  Wt Readings from Last 3 Encounters:  07/05/22 128 lb 11.2 oz (58.4 kg)  06/25/22 128 lb 1.9 oz (58.1 kg)  06/22/22 129 lb (58.5 kg)    Body mass index is 23.16 kg/m.  Performance status (ECOG): 1 - Symptomatic but completely ambulatory  PHYSICAL EXAM:   GENERAL:alert, no distress and  comfortable SKIN: skin color, texture, turgor are normal, no rashes or significant lesions EYES: normal, Conjunctiva are pink and non-injected, sclera clear OROPHARYNX:no exudate, no erythema and lips, buccal mucosa, and tongue normal  NECK: supple, thyroid normal size, non-tender, without nodularity LYMPH:  no palpable lymphadenopathy in the cervical, axillary or inguinal LUNGS: clear to auscultation and percussion with normal breathing effort HEART: regular rate & rhythm and no murmurs and no lower extremity edema ABDOMEN:abdomen soft, non-tender and normal bowel sounds Musculoskeletal:no cyanosis of digits and no clubbing  NEURO: alert & oriented x 3 with fluent speech, no focal motor/sensory deficits  LABORATORY DATA:  I have reviewed the data as listed    Component Value Date/Time   NA 139 07/05/2022 0000   K 4.1 07/05/2022 0000   CL 107 07/05/2022 0000   CO2 26 (A) 07/05/2022 0000   GLUCOSE 124 (H) 07/03/2019 1400   BUN 17 07/05/2022 0000   CREATININE 0.8 07/05/2022 0000  CREATININE 1.23 (H) 07/03/2019 1400   CALCIUM 8.9 07/05/2022 0000   ALBUMIN 3.8 07/05/2022 0000   AST 21 07/05/2022 0000   ALT 15 07/05/2022 0000   ALKPHOS 115 07/05/2022 0000   GFRNONAA 44 (L) 07/03/2019 1400   GFRAA 51 (L) 07/03/2019 1400    No results found for: "SPEP", "UPEP"  Lab Results  Component Value Date   WBC 7.0 07/05/2022   NEUTROABS 5.53 07/05/2022   HGB 8.9 (A) 07/05/2022   HCT 26 (A) 07/05/2022   MCV 94 03/19/2022   PLT 110 (A) 07/05/2022      Chemistry      Component Value Date/Time   NA 139 07/05/2022 0000   K 4.1 07/05/2022 0000   CL 107 07/05/2022 0000   CO2 26 (A) 07/05/2022 0000   BUN 17 07/05/2022 0000   CREATININE 0.8 07/05/2022 0000   CREATININE 1.23 (H) 07/03/2019 1400   GLU 111 07/05/2022 0000      Component Value Date/Time   CALCIUM 8.9 07/05/2022 0000   ALKPHOS 115 07/05/2022 0000   AST 21 07/05/2022 0000   ALT 15 07/05/2022 0000       RADIOGRAPHIC  STUDIES: I have personally reviewed the radiological images as listed and agreed with the findings in the report. No results found.

## 2022-07-05 NOTE — Assessment & Plan Note (Signed)
A 76 y.o. female with stage IIIA classical Hodgkin lymphoma, nodular sclerosis subtype. She is currently undergoing treatment with dacarbazine, doxorubicin and vinblastine. She is due Day 1, cycle 6 this week. She is feeling well overall. She will return to clinic in 2 weeks for repeat evaluation with Dr. Bobby Rumpf.

## 2022-07-06 ENCOUNTER — Encounter: Payer: Self-pay | Admitting: Oncology

## 2022-07-06 ENCOUNTER — Inpatient Hospital Stay: Payer: Medicare HMO

## 2022-07-06 ENCOUNTER — Other Ambulatory Visit: Payer: Self-pay

## 2022-07-06 VITALS — BP 127/58 | HR 98 | Temp 98.0°F | Resp 18 | Wt 128.0 lb

## 2022-07-06 DIAGNOSIS — Z5111 Encounter for antineoplastic chemotherapy: Secondary | ICD-10-CM | POA: Diagnosis present

## 2022-07-06 DIAGNOSIS — Z5189 Encounter for other specified aftercare: Secondary | ICD-10-CM | POA: Diagnosis not present

## 2022-07-06 DIAGNOSIS — Z79899 Other long term (current) drug therapy: Secondary | ICD-10-CM | POA: Diagnosis not present

## 2022-07-06 DIAGNOSIS — C8118 Nodular sclerosis classical Hodgkin lymphoma, lymph nodes of multiple sites: Secondary | ICD-10-CM | POA: Diagnosis present

## 2022-07-06 DIAGNOSIS — D6481 Anemia due to antineoplastic chemotherapy: Secondary | ICD-10-CM | POA: Diagnosis not present

## 2022-07-06 MED ORDER — SODIUM CHLORIDE 0.9 % IV SOLN
150.0000 mg | Freq: Once | INTRAVENOUS | Status: AC
  Administered 2022-07-06: 150 mg via INTRAVENOUS
  Filled 2022-07-06: qty 150

## 2022-07-06 MED ORDER — SODIUM CHLORIDE 0.9% FLUSH
10.0000 mL | INTRAVENOUS | Status: DC | PRN
  Administered 2022-07-06: 10 mL

## 2022-07-06 MED ORDER — SODIUM CHLORIDE 0.9 % IV SOLN
281.2500 mg/m2 | Freq: Once | INTRAVENOUS | Status: AC
  Administered 2022-07-06: 470 mg via INTRAVENOUS
  Filled 2022-07-06: qty 47

## 2022-07-06 MED ORDER — HEPARIN SOD (PORK) LOCK FLUSH 100 UNIT/ML IV SOLN
500.0000 [IU] | Freq: Once | INTRAVENOUS | Status: AC | PRN
  Administered 2022-07-06: 500 [IU]

## 2022-07-06 MED ORDER — DEXAMETHASONE SODIUM PHOSPHATE 10 MG/ML IJ SOLN
4.0000 mg | Freq: Once | INTRAMUSCULAR | Status: AC
  Administered 2022-07-06: 4 mg via INTRAVENOUS

## 2022-07-06 MED ORDER — DOXORUBICIN HCL CHEMO IV INJECTION 2 MG/ML
18.7500 mg/m2 | Freq: Once | INTRAVENOUS | Status: AC
  Administered 2022-07-06: 32 mg via INTRAVENOUS
  Filled 2022-07-06: qty 16

## 2022-07-06 MED ORDER — PALONOSETRON HCL INJECTION 0.25 MG/5ML
0.2500 mg | Freq: Once | INTRAVENOUS | Status: AC
  Administered 2022-07-06: 0.25 mg via INTRAVENOUS

## 2022-07-06 MED ORDER — SODIUM CHLORIDE 0.9 % IV SOLN: Freq: Once | INTRAVENOUS | Status: AC

## 2022-07-06 MED ORDER — LORAZEPAM 2 MG/ML IJ SOLN: 0.5000 mg | Freq: Once | INTRAMUSCULAR | Status: DC | PRN

## 2022-07-06 MED ORDER — VINBLASTINE SULFATE CHEMO INJECTION 1 MG/ML
4.5000 mg/m2 | Freq: Once | INTRAVENOUS | Status: AC
  Administered 2022-07-06: 7.5 mg via INTRAVENOUS
  Filled 2022-07-06: qty 7.5

## 2022-07-09 ENCOUNTER — Inpatient Hospital Stay: Payer: Medicare HMO

## 2022-07-09 VITALS — BP 118/52 | HR 106 | Temp 97.9°F | Resp 18 | Ht 62.5 in | Wt 128.0 lb

## 2022-07-09 DIAGNOSIS — Z5189 Encounter for other specified aftercare: Secondary | ICD-10-CM | POA: Diagnosis not present

## 2022-07-09 DIAGNOSIS — Z5111 Encounter for antineoplastic chemotherapy: Secondary | ICD-10-CM | POA: Diagnosis not present

## 2022-07-09 DIAGNOSIS — Z79899 Other long term (current) drug therapy: Secondary | ICD-10-CM | POA: Diagnosis not present

## 2022-07-09 DIAGNOSIS — C8118 Nodular sclerosis classical Hodgkin lymphoma, lymph nodes of multiple sites: Secondary | ICD-10-CM

## 2022-07-09 DIAGNOSIS — D6481 Anemia due to antineoplastic chemotherapy: Secondary | ICD-10-CM | POA: Diagnosis not present

## 2022-07-09 MED ORDER — PEGFILGRASTIM-JMDB 6 MG/0.6ML ~~LOC~~ SOSY
6.0000 mg | PREFILLED_SYRINGE | Freq: Once | SUBCUTANEOUS | Status: AC
  Administered 2022-07-09: 6 mg via SUBCUTANEOUS
  Filled 2022-07-09: qty 0.6

## 2022-07-09 NOTE — Patient Instructions (Signed)

## 2022-07-12 ENCOUNTER — Inpatient Hospital Stay: Payer: Medicare HMO

## 2022-07-12 ENCOUNTER — Other Ambulatory Visit: Payer: Self-pay | Admitting: Hematology and Oncology

## 2022-07-12 ENCOUNTER — Inpatient Hospital Stay (INDEPENDENT_AMBULATORY_CARE_PROVIDER_SITE_OTHER): Payer: Medicare HMO | Admitting: Hematology and Oncology

## 2022-07-12 ENCOUNTER — Encounter: Payer: Self-pay | Admitting: Hematology and Oncology

## 2022-07-12 ENCOUNTER — Telehealth: Payer: Self-pay

## 2022-07-12 VITALS — BP 149/67 | HR 103 | Temp 98.1°F | Resp 18 | Ht 62.5 in | Wt 125.1 lb

## 2022-07-12 DIAGNOSIS — Z5189 Encounter for other specified aftercare: Secondary | ICD-10-CM | POA: Diagnosis not present

## 2022-07-12 DIAGNOSIS — C8118 Nodular sclerosis classical Hodgkin lymphoma, lymph nodes of multiple sites: Secondary | ICD-10-CM

## 2022-07-12 DIAGNOSIS — Z5111 Encounter for antineoplastic chemotherapy: Secondary | ICD-10-CM | POA: Diagnosis not present

## 2022-07-12 DIAGNOSIS — Z79899 Other long term (current) drug therapy: Secondary | ICD-10-CM | POA: Diagnosis not present

## 2022-07-12 DIAGNOSIS — D6481 Anemia due to antineoplastic chemotherapy: Secondary | ICD-10-CM | POA: Diagnosis not present

## 2022-07-12 DIAGNOSIS — C8198 Hodgkin lymphoma, unspecified, lymph nodes of multiple sites: Secondary | ICD-10-CM | POA: Diagnosis not present

## 2022-07-12 LAB — COMPREHENSIVE METABOLIC PANEL
Albumin: 4 (ref 3.5–5.0)
Calcium: 9.3 (ref 8.7–10.7)

## 2022-07-12 LAB — BASIC METABOLIC PANEL
BUN: 27 — AB (ref 4–21)
CO2: 26 — AB (ref 13–22)
Chloride: 105 (ref 99–108)
Creatinine: 0.8 (ref 0.5–1.1)
Glucose: 143
Potassium: 4.9 mEq/L (ref 3.5–5.1)
Sodium: 136 — AB (ref 137–147)

## 2022-07-12 LAB — HEPATIC FUNCTION PANEL
ALT: 15 U/L (ref 7–35)
AST: 16 (ref 13–35)
Alkaline Phosphatase: 96 (ref 25–125)
Bilirubin, Total: 0.6

## 2022-07-12 LAB — CBC AND DIFFERENTIAL
HCT: 25 — AB (ref 36–46)
Hemoglobin: 8.1 — AB (ref 12.0–16.0)
Neutrophils Absolute: 2.24
Platelets: 66 10*3/uL — AB (ref 150–400)
WBC: 2.6

## 2022-07-12 LAB — CBC: RBC: 2.48 — AB (ref 3.87–5.11)

## 2022-07-12 LAB — PREPARE RBC (CROSSMATCH)

## 2022-07-12 LAB — SEDIMENTATION RATE: Sed Rate: 20 mm/hr (ref 0–22)

## 2022-07-12 NOTE — Assessment & Plan Note (Signed)
A 76 y.o. female with stage IIIA classical Hodgkin lymphoma, nodular sclerosis subtype. She is currently undergoing treatment with dacarbazine, doxorubicin and vinblastine. She completed Day 1, cycle 6 last week. She is feeling poorly overall. Hemoglobin is 8.1, so we will arrange for 2 units to be given tomorrow at [facility]. She will keep scheduled follow up.

## 2022-07-12 NOTE — Progress Notes (Signed)
Patient Care Team: Serita Grammes, MD as PCP - General (Family Medicine) Marice Potter, MD as Consulting Physician (Hematology and Oncology)  Clinic Day:  07/12/2022  Referring physician: Serita Grammes, MD  ASSESSMENT & PLAN:   Assessment & Plan: Hodgkin lymphoma of lymph nodes of multiple regions [facility]) A 76 y.o. female with stage IIIA classical Hodgkin lymphoma, nodular sclerosis subtype. She is currently undergoing treatment with dacarbazine, doxorubicin and vinblastine. She completed Day 1, cycle 6 last week. She is feeling poorly overall. Hemoglobin is 8.1, so we will arrange for 2 units to be given tomorrow at [facility]. She will keep scheduled follow up.     The patient understands the plans discussed today and is in agreement with them.  She knows to contact our office if she develops concerns prior to her next appointment.    Melodye Ped, NP  [place] [address] [place] [address] Dept: [phone] Dept Fax: [phone]   Orders Placed This Encounter  Procedures   Sedimentation rate    Standing Status:   Future    Number of Occurrences:   1    Standing Expiration Date:   07/12/2023   CBC and differential    This external order was created through the Results Console.   CBC    This external order was created through the Results Console.   Basic metabolic panel    This external order was created through the Results Console.   Comprehensive metabolic panel    This external order was created through the Results Console.   Hepatic function panel    This external order was created through the Results Console.      CHIEF COMPLAINT:  CC: A 76 year old female with history of lymphoma here for symptom management; weakness, fatigue  Current Treatment:  A+AVD  INTERVAL HISTORY:  Talaya is here today for repeat clinical assessment. She denies fevers or chills. She  denies pain. Her appetite is good. Her weight has been stable.  I have reviewed the past medical history, past surgical history, social history and family history with the patient and they are unchanged from previous note.  ALLERGIES:  is allergic to penicillins, phenazopyridine hcl, sulfa antibiotics, butorphanol, ciprofloxacin, and levofloxacin.  MEDICATIONS:  Current Outpatient Medications  Medication Sig Dispense Refill   albuterol (VENTOLIN HFA) 108 (90 BASE) MCG/ACT inhaler Inhale into the lungs every 6 (six) hours as needed for wheezing or shortness of breath. Inhale two puffs every four to six hours as needed.     benzonatate (TESSALON) 100 MG capsule Take 1 capsule (100 mg total) by mouth 3 (three) times daily as needed for cough. 30 capsule 1   Blood Glucose Monitoring Suppl (ONETOUCH VERIO FLEX SYSTEM) w/Device KIT CHECK BLOOD SUGAR ONCE A DAY     clotrimazole (MYCELEX) 10 MG troche Take by mouth.     estradiol (ESTRACE) 0.1 MG/GM vaginal cream Place 1 Applicatorful vaginally at bedtime.     famotidine (PEPCID) 20 MG tablet Take 20 mg by mouth 2 (two) times daily.     glucose blood (ONETOUCH ULTRA) test strip      ketorolac (ACULAR) 0.5 % ophthalmic solution SMARTSIG:In Eye(s)     Lancets (ONETOUCH DELICA PLUS [id number]) MISC Apply topically 2 (two) times daily.     lidocaine (XYLOCAINE) 2 % solution SMARTSIG:15 Milliliter(s) By Mouth Every 3 Hours     lidocaine-prilocaine (EMLA) cream Apply to affected area  once 30 g 3   LORazepam (ATIVAN) 1 MG tablet Take 1 tablet (1 mg total) by mouth every 8 (eight) hours. 30 tablet 0   mupirocin ointment (BACTROBAN) 2 % Apply topically.     nystatin (MYCOSTATIN) 100000 UNIT/ML suspension Take 5 mLs (500,000 Units total) by mouth 4 (four) times daily. 473 mL 0   omeprazole (PRILOSEC) 40 MG capsule Take by mouth.     ondansetron (ZOFRAN) 8 MG tablet Take by mouth.     pantoprazole (PROTONIX) 40 MG tablet Take 40 mg by mouth 2 (two) times daily.      pioglitazone (ACTOS) 30 MG tablet Take by mouth.     prochlorperazine (COMPAZINE) 10 MG tablet Take 1 tablet (10 mg total) by mouth every 6 (six) hours as needed (Nausea or vomiting). 30 tablet 1   senna (SENOKOT) 8.6 MG tablet Take 2 tablets by mouth in the morning and at bedtime. Hold if diarrhea.     traMADol (ULTRAM) 50 MG tablet Take 1 tablet (50 mg total) by mouth every 6 (six) hours as needed. 20 tablet 0   No current facility-administered medications for this visit.    HISTORY OF PRESENT ILLNESS:   Oncology History  Hodgkin lymphoma of lymph nodes of multiple regions ([place])  01/18/2022 Initial Diagnosis   Hodgkin lymphoma of lymph nodes of multiple regions ([place])   02/01/2022 -  Chemotherapy   Patient is on Treatment Plan :  HODGKINS LYMPHOMA Sequential A + AVD (Brentuximab q21d / Doxorubicin + Vinblastine + Dacarbazine q28d)          REVIEW OF SYSTEMS:   Constitutional: Denies fevers, chills or abnormal weight loss Eyes: Denies blurriness of vision Ears, nose, mouth, throat, and face: Denies mucositis or sore throat Respiratory: Denies cough, dyspnea or wheezes Cardiovascular: Denies palpitation, chest discomfort or lower extremity swelling Gastrointestinal:  Denies nausea, heartburn or change in bowel habits Skin: Denies abnormal skin rashes Lymphatics: Denies new lymphadenopathy or easy bruising Neurological:Denies numbness, tingling or new weaknesses Behavioral/Psych: Mood is stable, no new changes  All other systems were reviewed with the patient and are negative.   VITALS:  Blood pressure (!) 149/67, pulse (!) 103, temperature 98.1 F (36.7 C), temperature source Oral, resp. rate 18, height 5' 2.5" (1.588 m), weight 125 lb 1.6 oz (56.7 kg), SpO2 100 %.  Wt Readings from Last 3 Encounters:  07/12/22 125 lb 1.6 oz (56.7 kg)  07/09/22 128 lb (58.1 kg)  07/06/22 128 lb (58.1 kg)    Body mass index is 22.52 kg/m.  Performance status (ECOG): 1 - Symptomatic but  completely ambulatory  PHYSICAL EXAM:   GENERAL:alert, no distress and comfortable SKIN: skin color, texture, turgor are normal, no rashes or significant lesions EYES: normal, Conjunctiva are pink and non-injected, sclera clear OROPHARYNX:no exudate, no erythema and lips, buccal mucosa, and tongue normal  NECK: supple, thyroid normal size, non-tender, without nodularity LYMPH:  no palpable lymphadenopathy in the cervical, axillary or inguinal LUNGS: clear to auscultation and percussion with normal breathing effort HEART: regular rate & rhythm and no murmurs and no lower extremity edema ABDOMEN:abdomen soft, non-tender and normal bowel sounds Musculoskeletal:no cyanosis of digits and no clubbing  NEURO: alert & oriented x 3 with fluent speech, no focal motor/sensory deficits  LABORATORY DATA:  I have reviewed the data as listed    Component Value Date/Time   NA 136 (A) 07/12/2022 0000   K 4.9 07/12/2022 0000   CL 105 07/12/2022 0000   CO2 26 (  A) 07/12/2022 0000   GLUCOSE 124 (H) 07/03/2019 1400   BUN 27 (A) 07/12/2022 0000   CREATININE 0.8 07/12/2022 0000   CREATININE 1.23 (H) 07/03/2019 1400   CALCIUM 9.3 07/12/2022 0000   ALBUMIN 4.0 07/12/2022 0000   AST 16 07/12/2022 0000   ALT 15 07/12/2022 0000   ALKPHOS 96 07/12/2022 0000   GFRNONAA 44 (L) 07/03/2019 1400   GFRAA 51 (L) 07/03/2019 1400    No results found for: "SPEP", "UPEP"  Lab Results  Component Value Date   WBC 2.6 07/12/2022   NEUTROABS 2.24 07/12/2022   HGB 8.1 (A) 07/12/2022   HCT 25 (A) 07/12/2022   MCV 94 03/19/2022   PLT 66 (A) 07/12/2022      Chemistry      Component Value Date/Time   NA 136 (A) 07/12/2022 0000   K 4.9 07/12/2022 0000   CL 105 07/12/2022 0000   CO2 26 (A) 07/12/2022 0000   BUN 27 (A) 07/12/2022 0000   CREATININE 0.8 07/12/2022 0000   CREATININE 1.23 (H) 07/03/2019 1400   GLU 143 07/12/2022 0000      Component Value Date/Time   CALCIUM 9.3 07/12/2022 0000   ALKPHOS 96  07/12/2022 0000   AST 16 07/12/2022 0000   ALT 15 07/12/2022 0000       RADIOGRAPHIC STUDIES: I have personally reviewed the radiological images as listed and agreed with the findings in the report. No results found.

## 2022-07-12 NOTE — Telephone Encounter (Signed)
07/12/2022 Angie instructed to give her mama dulcolax suppository now. If no results, repeat in 3-4 hrs. Then begin senna S 1-2 tabs po BID. Angie wrote down these instructions. Appt given for today @ 2p for labs and to see Melissa,NP.   07/12/2022 - Received call from Pt's daughter, Angie. Pt is not eating, not drinking, has unrelieved nausea w/antiemetic, & is constipated. She is feeling weak, & sleeping all the time. Pt's daughter thinks maybe she needs blood work.

## 2022-07-12 NOTE — Progress Notes (Unsigned)
No critical called from lab.  Hgb 8.1, patient symptomatic.    Scheduled for 2 units of PRBC at [facility] on 07/13/2022 @ 11:00 per Lenna Sciara, P NP.  Patient aware.

## 2022-07-13 ENCOUNTER — Encounter: Payer: Self-pay | Admitting: Oncology

## 2022-07-13 ENCOUNTER — Inpatient Hospital Stay: Payer: Medicare HMO

## 2022-07-13 ENCOUNTER — Other Ambulatory Visit: Payer: Self-pay

## 2022-07-13 DIAGNOSIS — Z79899 Other long term (current) drug therapy: Secondary | ICD-10-CM | POA: Diagnosis not present

## 2022-07-13 DIAGNOSIS — Z5111 Encounter for antineoplastic chemotherapy: Secondary | ICD-10-CM | POA: Diagnosis not present

## 2022-07-13 DIAGNOSIS — C8118 Nodular sclerosis classical Hodgkin lymphoma, lymph nodes of multiple sites: Secondary | ICD-10-CM

## 2022-07-13 DIAGNOSIS — D6481 Anemia due to antineoplastic chemotherapy: Secondary | ICD-10-CM | POA: Diagnosis not present

## 2022-07-13 DIAGNOSIS — Z5189 Encounter for other specified aftercare: Secondary | ICD-10-CM | POA: Diagnosis not present

## 2022-07-13 MED ORDER — SODIUM CHLORIDE 0.9% IV SOLUTION
250.0000 mL | Freq: Once | INTRAVENOUS | Status: AC
  Administered 2022-07-13: 250 mL via INTRAVENOUS

## 2022-07-13 MED ORDER — HEPARIN SOD (PORK) LOCK FLUSH 100 UNIT/ML IV SOLN
500.0000 [IU] | Freq: Once | INTRAVENOUS | Status: AC
  Administered 2022-07-13: 500 [IU] via INTRAVENOUS

## 2022-07-13 MED ORDER — SODIUM CHLORIDE 0.9% FLUSH
10.0000 mL | INTRAVENOUS | Status: DC | PRN
  Administered 2022-07-13: 10 mL via INTRAVENOUS

## 2022-07-13 MED ORDER — ONDANSETRON HCL 8 MG PO TABS
8.0000 mg | ORAL_TABLET | Freq: Once | ORAL | Status: AC
  Administered 2022-07-13: 8 mg via ORAL
  Filled 2022-07-13: qty 1

## 2022-07-13 NOTE — Progress Notes (Signed)
Patient c/o nausea and stated that she usually takes zofran 8 mg at home. Received a verbal order for zofran '8mg'$  from [place] Scrape NP.

## 2022-07-13 NOTE — Progress Notes (Signed)
Pt reported taking Tylenol and Benadryl at home PTA

## 2022-07-13 NOTE — Patient Instructions (Signed)

## 2022-07-14 LAB — TYPE AND SCREEN
ABO/RH(D): O POS
Antibody Screen: NEGATIVE
Unit division: 0
Unit division: 0

## 2022-07-14 LAB — BPAM RBC
Blood Product Expiration Date: 202309182359
Blood Product Expiration Date: 202309182359
ISSUE DATE / TIME: 202308181140
ISSUE DATE / TIME: 202308181140
Unit Type and Rh: 5100
Unit Type and Rh: 5100

## 2022-07-18 NOTE — Progress Notes (Signed)
[place]  [address] [place],  [place]  [address] [phone]  Clinic Day:  07/19/2022  Referring physician: Serita Grammes, MD  HISTORY OF PRESENT ILLNESS:  The patient is a 76 y.o. female with stage IIIA classical Hodgkin lymphoma.  She comes in today to be evaluated before receiving cycle 6, day 15 of Adriamycin/vinblastine/dacarbazine (AVD) chemotherapy.  Her AVD chemotherapy was preceded by 2 cycles of brentuximab vedotin.  57 the patient claims to be tolerating her AVD treatments okay.  She continues to complain of feeling chronically weak after each treatment is given.  However, she denies any particular changes in her health.  As it pertains to her Hodgkin lymphoma, she denies having any B symptoms or prominent lymphadenopathy which concerns her for refractory disease while on treatment.  PHYSICAL EXAM:  Blood pressure (!) 191/76, pulse 93, temperature 98.4 F (36.9 C), resp. rate 14, height 5' 2.5" (1.588 m), weight 129 lb 4.8 oz (58.7 kg), SpO2 98 %. Wt Readings from Last 3 Encounters:  07/19/22 129 lb 4.8 oz (58.7 kg)  07/12/22 125 lb 1.6 oz (56.7 kg)  07/09/22 128 lb (58.1 kg)   Body mass index is 23.27 kg/m. Performance status (ECOG): 1 - Symptomatic but completely ambulatory Physical Exam Constitutional:      Appearance: Normal appearance.     Comments: Anxious, but is in better spirits.  HENT:     Mouth/Throat:     Pharynx: Oropharynx is clear. No oropharyngeal exudate.  Cardiovascular:     Rate and Rhythm: Normal rate and regular rhythm.     Heart sounds: No murmur heard.    No friction rub. No gallop.  Pulmonary:     Breath sounds: Normal breath sounds.  Chest:  Breasts:    Right: No swelling, bleeding, inverted nipple, mass, nipple discharge or skin change.     Left: No swelling, bleeding, inverted nipple, mass, nipple discharge or skin change.  Abdominal:     General: Bowel sounds are normal. There is no  distension.     Palpations: Abdomen is soft. There is no mass.     Tenderness: There is no abdominal tenderness.  Musculoskeletal:        General: No tenderness.     Cervical back: Normal range of motion and neck supple.     Right lower leg: No edema.     Left lower leg: No edema.  Lymphadenopathy:     Cervical: No cervical adenopathy.     Right cervical: No superficial, deep or posterior cervical adenopathy.    Left cervical: No superficial, deep or posterior cervical adenopathy.     Upper Body:     Right upper body: No supraclavicular or axillary adenopathy.     Left upper body: No supraclavicular or axillary adenopathy.     Lower Body: No right inguinal adenopathy. No left inguinal adenopathy.  Skin:    Coloration: Skin is not jaundiced.     Findings: No lesion or rash.  Neurological:     General: No focal deficit present.     Mental Status: She is alert and oriented to person, place, and time. Mental status is at baseline.  Psychiatric:        Mood and Affect: Mood normal.        Behavior: Behavior normal.        Thought Content: Thought content normal.        Judgment: Judgment normal.   LABS:  Latest Reference Range & Units  07/19/22 00:00  Sodium 137 - 147  138 (E)  Potassium 3.5 - 5.1 mEq/L 4.1 (E)  Chloride 99 - 108  105 (E)  CO2 13 - 22  30 ! (E)  Glucose  137 (E)  BUN 4 - 21  11 (E)  Creatinine 0.5 - 1.1  0.7 (E)  Calcium 8.7 - 10.7  9.2 (E)  Alkaline Phosphatase 25 - 125  117 (E)  Albumin 3.5 - 5.0  3.7 (E)  AST 13 - 35  26 (E)  ALT 7 - 35 U/L 19 (E)  Bilirubin, Total  0.4 (E)  WBC  7.0 (E)  RBC 3.87 - 5.11  3.61 ! (E)  Hemoglobin 12.0 - 16.0  11.5 ! (E)  HCT 36 - 46  35 ! (E)  Platelets 150 - 400 K/uL 101 ! (E)  NEUT#  5.53 (E)  !: Data is abnormal (E): External lab result  ASSESSMENT & PLAN:  Assessment/Plan:  A 76 y.o. female with stage IIIA classical Hodgkin lymphoma, nodular sclerosis subtype.  She will proceed with cycle 6, day 15 of AVD treatment  tomorrow.  She will continue to receive white cells therapy after each treatment to stay on schedule for her biweekly therapy.  Overall, I do believe she is doing fairly well.  I see no decline in her baseline health.  I will see her back in 2 weeks before she heads into cycle 7, day 1 of AVD.  The patient and her family understand all the plans discussed today and are in agreement with them.  Jaryan Chicoine Macarthur Critchley, MD

## 2022-07-18 NOTE — Progress Notes (Deleted)
Patient Care Team: Serita Grammes, MD as PCP - General (Family Medicine) Marice Potter, MD as Consulting Physician (Hematology and Oncology)  Clinic Day:  07/18/2022  Referring physician: Serita Grammes, MD  ASSESSMENT & PLAN:   Assessment & Plan: No problem-specific Assessment & Plan notes found for this encounter.     The patient understands the plans discussed today and is in agreement with them.  She knows to contact our office if she develops concerns prior to her next appointment.    Marice Potter, MD  [place] [address] [place] [address] Dept: [phone] Dept Fax: [phone]   No orders of the defined types were placed in this encounter.     CHIEF COMPLAINT:  CC: A 76 year old female with history of Hodgkin's Lymphoma here for 2 week evaluation  Current Treatment:  A + AVD  INTERVAL HISTORY:  Fallon is here today for repeat clinical assessment. She denies fevers or chills. She denies pain. Her appetite is good. Her weight has been stable.  I have reviewed the past medical history, past surgical history, social history and family history with the patient and they are unchanged from previous note.  ALLERGIES:  is allergic to penicillins, phenazopyridine hcl, sulfa antibiotics, butorphanol, ciprofloxacin, and levofloxacin.  MEDICATIONS:  Current Outpatient Medications  Medication Sig Dispense Refill   albuterol (VENTOLIN HFA) 108 (90 BASE) MCG/ACT inhaler Inhale into the lungs every 6 (six) hours as needed for wheezing or shortness of breath. Inhale two puffs every four to six hours as needed.     benzonatate (TESSALON) 100 MG capsule Take 1 capsule (100 mg total) by mouth 3 (three) times daily as needed for cough. 30 capsule 1   Blood Glucose Monitoring Suppl (ONETOUCH VERIO FLEX SYSTEM) w/Device KIT CHECK BLOOD SUGAR ONCE A DAY     clotrimazole (MYCELEX) 10 MG troche Take  by mouth.     estradiol (ESTRACE) 0.1 MG/GM vaginal cream Place 1 Applicatorful vaginally at bedtime.     famotidine (PEPCID) 20 MG tablet Take 20 mg by mouth 2 (two) times daily.     glucose blood (ONETOUCH ULTRA) test strip      ketorolac (ACULAR) 0.5 % ophthalmic solution SMARTSIG:In Eye(s)     Lancets (ONETOUCH DELICA PLUS [id number]) MISC Apply topically 2 (two) times daily.     lidocaine (XYLOCAINE) 2 % solution SMARTSIG:15 Milliliter(s) By Mouth Every 3 Hours     lidocaine-prilocaine (EMLA) cream Apply to affected area once 30 g 3   LORazepam (ATIVAN) 1 MG tablet Take 1 tablet (1 mg total) by mouth every 8 (eight) hours. 30 tablet 0   mupirocin ointment (BACTROBAN) 2 % Apply topically.     nystatin (MYCOSTATIN) 100000 UNIT/ML suspension Take 5 mLs (500,000 Units total) by mouth 4 (four) times daily. 473 mL 0   omeprazole (PRILOSEC) 40 MG capsule Take by mouth.     ondansetron (ZOFRAN) 8 MG tablet Take by mouth.     pantoprazole (PROTONIX) 40 MG tablet Take 40 mg by mouth 2 (two) times daily.     pioglitazone (ACTOS) 30 MG tablet Take by mouth.     prochlorperazine (COMPAZINE) 10 MG tablet Take 1 tablet (10 mg total) by mouth every 6 (six) hours as needed (Nausea or vomiting). 30 tablet 1   senna (SENOKOT) 8.6 MG tablet Take 2 tablets by mouth in the morning and at bedtime. Hold if diarrhea.     traMADol (ULTRAM)  50 MG tablet Take 1 tablet (50 mg total) by mouth every 6 (six) hours as needed. 20 tablet 0   No current facility-administered medications for this visit.    HISTORY OF PRESENT ILLNESS:   Oncology History  Hodgkin lymphoma of lymph nodes of multiple regions ([place])  01/18/2022 Initial Diagnosis   Hodgkin lymphoma of lymph nodes of multiple regions ([place])   02/01/2022 -  Chemotherapy   Patient is on Treatment Plan :  HODGKINS LYMPHOMA Sequential A + AVD (Brentuximab q21d / Doxorubicin + Vinblastine + Dacarbazine q28d)          REVIEW OF SYSTEMS:   Constitutional: Denies  fevers, chills or abnormal weight loss Eyes: Denies blurriness of vision Ears, nose, mouth, throat, and face: Denies mucositis or sore throat Respiratory: Denies cough, dyspnea or wheezes Cardiovascular: Denies palpitation, chest discomfort or lower extremity swelling Gastrointestinal:  Denies nausea, heartburn or change in bowel habits Skin: Denies abnormal skin rashes Lymphatics: Denies new lymphadenopathy or easy bruising Neurological:Denies numbness, tingling or new weaknesses Behavioral/Psych: Mood is stable, no new changes  All other systems were reviewed with the patient and are negative.   VITALS:  There were no vitals taken for this visit.  Wt Readings from Last 3 Encounters:  07/12/22 125 lb 1.6 oz (56.7 kg)  07/09/22 128 lb (58.1 kg)  07/06/22 128 lb (58.1 kg)    There is no height or weight on file to calculate BMI.  Performance status (ECOG): 1 - Symptomatic but completely ambulatory  PHYSICAL EXAM:   GENERAL:alert, no distress and comfortable SKIN: skin color, texture, turgor are normal, no rashes or significant lesions EYES: normal, Conjunctiva are pink and non-injected, sclera clear OROPHARYNX:no exudate, no erythema and lips, buccal mucosa, and tongue normal  NECK: supple, thyroid normal size, non-tender, without nodularity LYMPH:  no palpable lymphadenopathy in the cervical, axillary or inguinal LUNGS: clear to auscultation and percussion with normal breathing effort HEART: regular rate & rhythm and no murmurs and no lower extremity edema ABDOMEN:abdomen soft, non-tender and normal bowel sounds Musculoskeletal:no cyanosis of digits and no clubbing  NEURO: alert & oriented x 3 with fluent speech, no focal motor/sensory deficits  LABORATORY DATA:  I have reviewed the data as listed    Component Value Date/Time   NA 136 (A) 07/12/2022 0000   K 4.9 07/12/2022 0000   CL 105 07/12/2022 0000   CO2 26 (A) 07/12/2022 0000   GLUCOSE 124 (H) 07/03/2019 1400   BUN  27 (A) 07/12/2022 0000   CREATININE 0.8 07/12/2022 0000   CREATININE 1.23 (H) 07/03/2019 1400   CALCIUM 9.3 07/12/2022 0000   ALBUMIN 4.0 07/12/2022 0000   AST 16 07/12/2022 0000   ALT 15 07/12/2022 0000   ALKPHOS 96 07/12/2022 0000   GFRNONAA 44 (L) 07/03/2019 1400   GFRAA 51 (L) 07/03/2019 1400    No results found for: "SPEP", "UPEP"  Lab Results  Component Value Date   WBC 2.6 07/12/2022   NEUTROABS 2.24 07/12/2022   HGB 8.1 (A) 07/12/2022   HCT 25 (A) 07/12/2022   MCV 94 03/19/2022   PLT 66 (A) 07/12/2022      Chemistry      Component Value Date/Time   NA 136 (A) 07/12/2022 0000   K 4.9 07/12/2022 0000   CL 105 07/12/2022 0000   CO2 26 (A) 07/12/2022 0000   BUN 27 (A) 07/12/2022 0000   CREATININE 0.8 07/12/2022 0000   CREATININE 1.23 (H) 07/03/2019 1400   GLU 143  07/12/2022 0000      Component Value Date/Time   CALCIUM 9.3 07/12/2022 0000   ALKPHOS 96 07/12/2022 0000   AST 16 07/12/2022 0000   ALT 15 07/12/2022 0000       RADIOGRAPHIC STUDIES: I have personally reviewed the radiological images as listed and agreed with the findings in the report. No results found.

## 2022-07-19 ENCOUNTER — Inpatient Hospital Stay: Payer: Medicare HMO

## 2022-07-19 ENCOUNTER — Inpatient Hospital Stay (INDEPENDENT_AMBULATORY_CARE_PROVIDER_SITE_OTHER): Payer: Medicare HMO | Admitting: Oncology

## 2022-07-19 VITALS — BP 191/76 | HR 93 | Temp 98.4°F | Resp 14 | Ht 62.5 in | Wt 129.3 lb

## 2022-07-19 DIAGNOSIS — D649 Anemia, unspecified: Secondary | ICD-10-CM | POA: Diagnosis not present

## 2022-07-19 DIAGNOSIS — C8118 Nodular sclerosis classical Hodgkin lymphoma, lymph nodes of multiple sites: Secondary | ICD-10-CM

## 2022-07-19 DIAGNOSIS — J069 Acute upper respiratory infection, unspecified: Secondary | ICD-10-CM | POA: Diagnosis not present

## 2022-07-19 LAB — HEPATIC FUNCTION PANEL
ALT: 19 U/L (ref 7–35)
AST: 26 (ref 13–35)
Alkaline Phosphatase: 117 (ref 25–125)
Bilirubin, Total: 0.4

## 2022-07-19 LAB — BASIC METABOLIC PANEL
BUN: 11 (ref 4–21)
CO2: 30 — AB (ref 13–22)
Chloride: 105 (ref 99–108)
Creatinine: 0.7 (ref 0.5–1.1)
Glucose: 137
Potassium: 4.1 mEq/L (ref 3.5–5.1)
Sodium: 138 (ref 137–147)

## 2022-07-19 LAB — CBC AND DIFFERENTIAL
HCT: 35 — AB (ref 36–46)
Hemoglobin: 11.5 — AB (ref 12.0–16.0)
Neutrophils Absolute: 5.53
Platelets: 101 10*3/uL — AB (ref 150–400)
WBC: 7

## 2022-07-19 LAB — CBC: RBC: 3.61 — AB (ref 3.87–5.11)

## 2022-07-19 LAB — COMPREHENSIVE METABOLIC PANEL
Albumin: 3.7 (ref 3.5–5.0)
Calcium: 9.2 (ref 8.7–10.7)

## 2022-07-19 MED FILL — Vinblastine Sulfate Inj 1 MG/ML: INTRAVENOUS | Qty: 7.5 | Status: AC

## 2022-07-19 MED FILL — Fosaprepitant Dimeglumine For IV Infusion 150 MG (Base Eq): INTRAVENOUS | Qty: 5 | Status: AC

## 2022-07-19 MED FILL — Doxorubicin HCl Inj 2 MG/ML: INTRAVENOUS | Qty: 16 | Status: AC

## 2022-07-19 MED FILL — Dacarbazine For Inj 200 MG: INTRAVENOUS | Qty: 47 | Status: AC

## 2022-07-20 ENCOUNTER — Other Ambulatory Visit: Payer: Self-pay

## 2022-07-20 ENCOUNTER — Inpatient Hospital Stay: Payer: Medicare HMO

## 2022-07-20 VITALS — BP 130/70 | HR 84 | Temp 98.9°F | Resp 18 | Ht 62.5 in | Wt 130.0 lb

## 2022-07-20 DIAGNOSIS — C8118 Nodular sclerosis classical Hodgkin lymphoma, lymph nodes of multiple sites: Secondary | ICD-10-CM

## 2022-07-20 DIAGNOSIS — Z79899 Other long term (current) drug therapy: Secondary | ICD-10-CM | POA: Diagnosis not present

## 2022-07-20 DIAGNOSIS — Z5189 Encounter for other specified aftercare: Secondary | ICD-10-CM | POA: Diagnosis not present

## 2022-07-20 DIAGNOSIS — D6481 Anemia due to antineoplastic chemotherapy: Secondary | ICD-10-CM | POA: Diagnosis not present

## 2022-07-20 DIAGNOSIS — Z5111 Encounter for antineoplastic chemotherapy: Secondary | ICD-10-CM | POA: Diagnosis not present

## 2022-07-20 MED ORDER — SODIUM CHLORIDE 0.9 % IV SOLN: Freq: Once | INTRAVENOUS | Status: AC

## 2022-07-20 MED ORDER — SODIUM CHLORIDE 0.9% FLUSH
10.0000 mL | INTRAVENOUS | Status: DC | PRN
  Administered 2022-07-20: 10 mL

## 2022-07-20 MED ORDER — SODIUM CHLORIDE 0.9 % IV SOLN
281.2500 mg/m2 | Freq: Once | INTRAVENOUS | Status: AC
  Administered 2022-07-20: 470 mg via INTRAVENOUS
  Filled 2022-07-20: qty 47

## 2022-07-20 MED ORDER — SODIUM CHLORIDE 0.9 % IV SOLN
150.0000 mg | Freq: Once | INTRAVENOUS | Status: AC
  Administered 2022-07-20: 150 mg via INTRAVENOUS
  Filled 2022-07-20: qty 150

## 2022-07-20 MED ORDER — DOXORUBICIN HCL CHEMO IV INJECTION 2 MG/ML
18.7500 mg/m2 | Freq: Once | INTRAVENOUS | Status: AC
  Administered 2022-07-20: 32 mg via INTRAVENOUS
  Filled 2022-07-20: qty 16

## 2022-07-20 MED ORDER — DEXAMETHASONE SODIUM PHOSPHATE 10 MG/ML IJ SOLN
4.0000 mg | Freq: Once | INTRAMUSCULAR | Status: AC
  Administered 2022-07-20: 4 mg via INTRAVENOUS
  Filled 2022-07-20: qty 1

## 2022-07-20 MED ORDER — VINBLASTINE SULFATE CHEMO INJECTION 1 MG/ML
4.5000 mg/m2 | Freq: Once | INTRAVENOUS | Status: AC
  Administered 2022-07-20: 7.5 mg via INTRAVENOUS
  Filled 2022-07-20: qty 7.5

## 2022-07-20 MED ORDER — PALONOSETRON HCL INJECTION 0.25 MG/5ML
0.2500 mg | Freq: Once | INTRAVENOUS | Status: AC
  Administered 2022-07-20: 0.25 mg via INTRAVENOUS
  Filled 2022-07-20: qty 5

## 2022-07-20 MED ORDER — LORAZEPAM 2 MG/ML IJ SOLN: 0.5000 mg | Freq: Once | INTRAMUSCULAR | Status: DC | PRN

## 2022-07-20 MED ORDER — HEPARIN SOD (PORK) LOCK FLUSH 100 UNIT/ML IV SOLN
500.0000 [IU] | Freq: Once | INTRAVENOUS | Status: AC | PRN
  Administered 2022-07-20: 500 [IU]

## 2022-07-20 NOTE — Patient Instructions (Signed)
[facility]  Discharge Instructions: Thank you for choosing [facility] to provide your oncology and hematology care.  If you have a lab appointment with the [facility], please go directly to the [facility] and check in at the registration area.   Wear comfortable clothing and clothing appropriate for easy access to any Portacath or PICC line.   We strive to give you quality time with your provider. You may need to reschedule your appointment if you arrive late (15 or more minutes).  Arriving late affects you and other patients whose appointments are after yours.  Also, if you miss three or more appointments without notifying the office, you may be dismissed from the clinic at the provider's discretion.      For prescription refill requests, have your pharmacy contact our office and allow 72 hours for refills to be completed.    Today you received the following chemotherapy and/or immunotherapy agents   To help prevent nausea and vomiting after your treatment, we encourage you to take your nausea medication as directed.  BELOW ARE SYMPTOMS THAT SHOULD BE REPORTED IMMEDIATELY: *FEVER GREATER THAN 100.4 F (38 C) OR HIGHER *CHILLS OR SWEATING *NAUSEA AND VOMITING THAT IS NOT CONTROLLED WITH YOUR NAUSEA MEDICATION *UNUSUAL SHORTNESS OF BREATH *UNUSUAL BRUISING OR BLEEDING *URINARY PROBLEMS (pain or burning when urinating, or frequent urination) *BOWEL PROBLEMS (unusual diarrhea, constipation, pain near the anus) TENDERNESS IN MOUTH AND THROAT WITH OR WITHOUT PRESENCE OF ULCERS (sore throat, sores in mouth, or a toothache) UNUSUAL RASH, SWELLING OR PAIN  UNUSUAL VAGINAL DISCHARGE OR ITCHING   Items with * indicate a potential emergency and should be followed up as soon as possible or go to the Emergency Department if any problems should occur.  Please show the CHEMOTHERAPY ALERT CARD or IMMUNOTHERAPY ALERT CARD at check-in to the Emergency  Department and triage nurse.  Should you have questions after your visit or need to cancel or reschedule your appointment, please contact [facility]  Dept: [phone]  and follow the prompts.  Office hours are 8:00 a.m. to 4:30 p.m. Monday - Friday. Please note that voicemails left after 4:00 p.m. may not be returned until the following business day.  We are closed weekends and major holidays. You have access to a nurse at all times for urgent questions. Please call the main number to the clinic Dept: [phone] and follow the prompts.  For any non-urgent questions, you may also contact your provider using MyChart. We now offer e-Visits for anyone 68 and older to request care online for non-urgent symptoms. For details visit [email or web address].   Also download the MyChart app! Go to the app store, search "MyChart", open the app, select [facility], and log in with your MyChart username and password.  Masks are optional in the cancer centers. If you would like for your care team to wear a mask while they are taking care of you, please let them know. You may have one support person who is at least 74 years old accompany you for your appointments. Vinblastine Injection What is this medication? VINBLASTINE (vin BLAS teen) treats some types of cancer. It works by slowing down the growth of cancer cells. This medicine may be used for other purposes; ask your health care provider or pharmacist if you have questions. COMMON BRAND NAME(S): Velban What should I tell my care team before I take this medication? They need to know if you have any  of these conditions: Heart disease Infection Liver disease Low white blood cell levels Lung disease An unusual or allergic reaction to vinblastine, other chemotherapy agents, other medications, foods, dyes, or preservatives Pregnant or trying to get pregnant Breast-feeding How should I use this medication? This medication is  injected into a vein. It is given by your care team in a hospital or clinic setting. Talk to your care team about the use of this medication in children. While it may be given to children for selected conditions, precautions do apply. Overdosage: If you think you have taken too much of this medicine contact a poison control center or emergency room at once. NOTE: This medicine is only for you. Do not share this medicine with others. What if I miss a dose? Keep appointments for follow-up doses. It is important not to miss your dose. Call your care team if you are unable to keep an appointment. What may interact with this medication? Erythromycin Phenytoin This medication may affect how other medications work, and other medications may affect the way this medication works. Talk with your care team about all of the medications you take. They may suggest changes to your treatment plan to lower the risk of side effects and to make sure your medications work as intended. This list may not describe all possible interactions. Give your health care provider a list of all the medicines, herbs, non-prescription drugs, or dietary supplements you use. Also tell them if you smoke, drink alcohol, or use illegal drugs. Some items may interact with your medicine. What should I watch for while using this medication? Your condition will be monitored carefully while you are receiving this medication. This medication may make you feel generally unwell. This is not uncommon as chemotherapy can affect healthy cells as well as cancer cells. Report any side effects. Continue your course of treatment even though you feel ill unless your care team tells you to stop. You may need blood work while taking this medication. This medication will cause constipation. If you do not have a bowel movement for 3 days, call your care team. This medication may increase your risk to bruise or bleed. Call your care team if you notice any  unusual bleeding. This medication may increase your risk of getting an infection. Call your care team for advice if you get a fever, chills, sore throat, or other symptoms of a cold or flu. Do not treat yourself. Try to avoid being around people who are sick. Be careful brushing or flossing your teeth or using a toothpick because you may get an infection or bleed more easily. If you have any dental work done, tell your dentist you are receiving this medication. Talk to your care team if you or your partner wish to become pregnant or think either of you might be pregnant. This medication can cause serious birth defects. This medication may cause infertility. Talk to your care team if you are concerned about your fertility. Talk to your care team before breastfeeding. Changes to your treatment plan may be needed. What side effects may I notice from receiving this medication? Side effects that you should report to your care team as soon as possible: Allergic reactions--skin rash, itching, hives, swelling of the face, lips, tongue, or throat Infection--fever, chills, cough, sore throat, wounds that don't heal, pain or trouble when passing urine, general feeling of discomfort or being unwell Painful swelling, warmth, or redness of the skin, blisters or sores at the infusion site Shortness of  breath or trouble breathing Unusual bruising or bleeding Side effects that usually do not require medical attention (report to your care team if they continue or are bothersome): Bone pain Constipation Hair loss Nausea Stomach pain Vomiting This list may not describe all possible side effects. Call your doctor for medical advice about side effects. You may report side effects to FDA at 1-800-FDA-1088. Where should I keep my medication? This medication is given in a hospital or clinic. It will not be stored at home. NOTE: This sheet is a summary. It may not cover all possible information. If you have questions  about this medicine, talk to your doctor, pharmacist, or health care provider.  2023 Elsevier/Gold Standard (2022-02-05 00:00:00) Dacarbazine Injection What is this medication? DACARBAZINE (da KAR ba zeen) treats skin cancer and lymphoma. It works by slowing down the growth of cancer cells. This medicine may be used for other purposes; ask your health care provider or pharmacist if you have questions. COMMON BRAND NAME(S): DTIC-Dome What should I tell my care team before I take this medication? They need to know if you have any of these conditions: Infection, such as chickenpox, cold sores, herpes Kidney disease Liver disease Low blood cell levels, such as low white cells, platelets, or red blood cells Recent radiation therapy An unusual or allergic reaction to dacarbazine, other medications, foods, dyes, or preservatives Pregnant or trying to get pregnant Breast-feeding How should I use this medication? This medication is given as an injected into a vein. It is given by your care team in a hospital or clinic setting. Talk to your care team about the use of this medication in children. While it may be prescribed for selected conditions, precautions do apply. Overdosage: If you think you have taken too much of this medicine contact a poison control center or emergency room at once. NOTE: This medicine is only for you. Do not share this medicine with others. What if I miss a dose? Keep appointments for follow-up doses. It is important not to miss your dose. Call your care team if you are unable to keep an appointment. What may interact with this medication? Do not take this medication with any of the following: Live virus vaccines This medication may also interact with the following: Medications to increase blood counts, such as filgrastim, pegfilgrastim, sargramostim This list may not describe all possible interactions. Give your health care provider a list of all the medicines, herbs,  non-prescription drugs, or dietary supplements you use. Also tell them if you smoke, drink alcohol, or use illegal drugs. Some items may interact with your medicine. What should I watch for while using this medication? Your condition will be monitored carefully while you are receiving this medication. You may need blood work done while taking this medication. This medication may make you feel generally unwell. This is not uncommon as chemotherapy can affect healthy cells as well as cancer cells. Report any side effects. Continue your course of treatment even though you feel ill unless your care team tells you to stop. This medication may increase your risk of getting an infection. Call your care team for advice if you get a fever, chills, sore throat, or other symptoms of a cold or flu. Do not treat yourself. Try to avoid being around people who are sick. This medication may increase your risk to bruise or bleed. Call your care team if you notice any unusual bleeding. Talk to your care team about your risk of cancer. You may be  more at risk for certain types of cancers if you take this medication. Talk to your care team if you wish to become pregnant or think you might be pregnant. This medication can cause serious birth defects if taken during pregnancy. A reliable form of contraception is recommended while taking this medication. Talk to your care team about effective forms of contraception. Do not breastfeed while taking this medication. What side effects may I notice from receiving this medication? Side effects that you should report to your care team as soon as possible: Allergic reactions--skin rash, itching, hives, swelling of the face, lips, tongue, or throat Infection--fever, chills, cough, sore throat, wounds that don't heal, pain or trouble when passing urine, general feeling of discomfort or being unwell Liver injury--right upper belly pain, loss of appetite, nausea, light-colored stool,  dark yellow or brown urine, yellowing skin or eyes, unusual weakness or fatigue Low red blood cell level--unusual weakness or fatigue, dizziness, headache, trouble breathing Painful swelling, warmth, or redness of the skin, blisters or sores at the infusion site Unusual bruising or bleeding Side effects that usually do not require medical attention (report to your care team if they continue or are bothersome): Hair loss Loss of appetite Nausea Vomiting This list may not describe all possible side effects. Call your doctor for medical advice about side effects. You may report side effects to FDA at 1-800-FDA-1088. Where should I keep my medication? This medication is given in a hospital or clinic. It will not be stored at home. NOTE: This sheet is a summary. It may not cover all possible information. If you have questions about this medicine, talk to your doctor, pharmacist, or health care provider.  2023 Elsevier/Gold Standard (2008-01-03 00:00:00) Doxorubicin Injection What is this medication? DOXORUBICIN (dox oh ROO bi sin) treats some types of cancer. It works by slowing down the growth of cancer cells. This medicine may be used for other purposes; ask your health care provider or pharmacist if you have questions. COMMON BRAND NAME(S): Adriamycin, Adriamycin PFS, Adriamycin RDF, Rubex What should I tell my care team before I take this medication? They need to know if you have any of these conditions: Heart disease History of low blood cell levels caused by a medication Liver disease Recent or ongoing radiation An unusual or allergic reaction to doxorubicin, other medications, foods, dyes, or preservatives If you or your partner are pregnant or trying to get pregnant Breast-feeding How should I use this medication? This medication is injected into a vein. It is given by your care team in a hospital or clinic setting. Talk to your care team about the use of this medication in  children. Special care may be needed. Overdosage: If you think you have taken too much of this medicine contact a poison control center or emergency room at once. NOTE: This medicine is only for you. Do not share this medicine with others. What if I miss a dose? Keep appointments for follow-up doses. It is important not to miss your dose. Call your care team if you are unable to keep an appointment. What may interact with this medication? 6-mercaptopurine Paclitaxel Phenytoin St. John's wort Trastuzumab Verapamil This list may not describe all possible interactions. Give your health care provider a list of all the medicines, herbs, non-prescription drugs, or dietary supplements you use. Also tell them if you smoke, drink alcohol, or use illegal drugs. Some items may interact with your medicine. What should I watch for while using this medication? Your condition  will be monitored carefully while you are receiving this medication. You may need blood work while taking this medication. This medication may make you feel generally unwell. This is not uncommon as chemotherapy can affect healthy cells as well as cancer cells. Report any side effects. Continue your course of treatment even though you feel ill unless your care team tells you to stop. There is a maximum amount of this medication you should receive throughout your life. The amount depends on the medical condition being treated and your overall health. Your care team will watch how much of this medication you receive. Tell your care team if you have taken this medication before. Your urine may turn red for a few days after your dose. This is not blood. If your urine is dark or brown, call your care team. In some cases, you may be given additional medications to help with side effects. Follow all directions for their use. This medication may increase your risk of getting an infection. Call your care team for advice if you get a fever, chills,  sore throat, or other symptoms of a cold or flu. Do not treat yourself. Try to avoid being around people who are sick. This medication may increase your risk to bruise or bleed. Call your care team if you notice any unusual bleeding. Talk to your care team about your risk of cancer. You may be more at risk for certain types of cancers if you take this medication. You should make sure that you get enough Coenzyme Q10 while you are taking this medication. Discuss the foods you eat and the vitamins you take with your care team. Talk to your care team if you or your partner may be pregnant. Serious birth defects can occur if you take this medication during pregnancy and for 6 months after the last dose. Contraception is recommended while taking this medication and for 6 months after the last dose. Your care team can help you find the option that works for you. If your partner can get pregnant, use a condom while taking this medication and for 6 months after the last dose. Do not breastfeed while taking this medication. This medication may cause infertility. Talk to your care team if you are concerned about your fertility. What side effects may I notice from receiving this medication? Side effects that you should report to your care team as soon as possible: Allergic reactions--skin rash, itching, hives, swelling of the face, lips, tongue, or throat Heart failure--shortness of breath, swelling of the ankles, feet, or hands, sudden weight gain, unusual weakness or fatigue Heart rhythm changes--fast or irregular heartbeat, dizziness, feeling faint or lightheaded, chest pain, trouble breathing Infection--fever, chills, cough, sore throat, wounds that don't heal, pain or trouble when passing urine, general feeling of discomfort or being unwell Low red blood cell level--unusual weakness or fatigue, dizziness, headache, trouble breathing Painful swelling, warmth, or redness of the skin, blisters or sores at the  infusion site Unusual bruising or bleeding Side effects that usually do not require medical attention (report to your care team if they continue or are bothersome): Diarrhea Hair loss Nausea Pain, redness, or swelling with sores inside the mouth or throat Red urine This list may not describe all possible side effects. Call your doctor for medical advice about side effects. You may report side effects to FDA at 1-800-FDA-1088. Where should I keep my medication? This medication is given in a hospital or clinic. It will not be stored at home. NOTE:  This sheet is a summary. It may not cover all possible information. If you have questions about this medicine, talk to your doctor, pharmacist, or health care provider.  2023 Elsevier/Gold Standard (2022-03-21 00:00:00)

## 2022-07-23 ENCOUNTER — Inpatient Hospital Stay: Payer: Medicare HMO

## 2022-07-23 VITALS — BP 148/67 | HR 89 | Temp 98.1°F | Resp 20 | Ht 62.5 in | Wt 127.0 lb

## 2022-07-23 DIAGNOSIS — H65192 Other acute nonsuppurative otitis media, left ear: Secondary | ICD-10-CM | POA: Diagnosis not present

## 2022-07-23 DIAGNOSIS — C8118 Nodular sclerosis classical Hodgkin lymphoma, lymph nodes of multiple sites: Secondary | ICD-10-CM | POA: Diagnosis not present

## 2022-07-23 DIAGNOSIS — D6481 Anemia due to antineoplastic chemotherapy: Secondary | ICD-10-CM | POA: Diagnosis not present

## 2022-07-23 DIAGNOSIS — Z5111 Encounter for antineoplastic chemotherapy: Secondary | ICD-10-CM | POA: Diagnosis not present

## 2022-07-23 DIAGNOSIS — Z5189 Encounter for other specified aftercare: Secondary | ICD-10-CM | POA: Diagnosis not present

## 2022-07-23 DIAGNOSIS — Z6821 Body mass index (BMI) 21.0-21.9, adult: Secondary | ICD-10-CM | POA: Diagnosis not present

## 2022-07-23 DIAGNOSIS — Z79899 Other long term (current) drug therapy: Secondary | ICD-10-CM | POA: Diagnosis not present

## 2022-07-23 MED ORDER — PEGFILGRASTIM-JMDB 6 MG/0.6ML ~~LOC~~ SOSY
6.0000 mg | PREFILLED_SYRINGE | Freq: Once | SUBCUTANEOUS | Status: AC
  Administered 2022-07-23: 6 mg via SUBCUTANEOUS
  Filled 2022-07-23: qty 0.6

## 2022-07-23 NOTE — Patient Instructions (Signed)

## 2022-07-25 ENCOUNTER — Other Ambulatory Visit: Payer: Self-pay | Admitting: Oncology

## 2022-07-25 DIAGNOSIS — C8118 Nodular sclerosis classical Hodgkin lymphoma, lymph nodes of multiple sites: Secondary | ICD-10-CM

## 2022-07-28 ENCOUNTER — Other Ambulatory Visit: Payer: Self-pay

## 2022-08-01 NOTE — Progress Notes (Signed)
[facility]  [address] [place],  [place]  [address] [phone]  Clinic Day:  08/02/2022  Referring physician: Serita Grammes, MD  HISTORY OF PRESENT ILLNESS:  The patient is a 76 y.o. female with stage IIIA classical Hodgkin lymphoma.  She comes in today to be evaluated before receiving cycle 7, day 1 of Adriamycin/vinblastine/dacarbazine (AVD) chemotherapy.  Her AVD chemotherapy was preceded by 2 cycles of brentuximab vedotin.  The patient claims to be tolerating her AVD treatments okay, but still feels weak after each treatment is given.  However, she denies any particular changes in her health.  As it pertains to her Hodgkin lymphoma, she denies having any B symptoms or prominent lymphadenopathy which concerns her for refractory disease while on treatment.  PHYSICAL EXAM:  Blood pressure (!) 193/80, pulse (!) 108, temperature 98 F (36.7 C), resp. rate 14, height 5' 2.5" (1.588 m), weight 127 lb 4.8 oz (57.7 kg), SpO2 98 %. Wt Readings from Last 3 Encounters:  08/02/22 127 lb 4.8 oz (57.7 kg)  07/23/22 127 lb (57.6 kg)  07/20/22 130 lb (59 kg)   Body mass index is 22.91 kg/m. Performance status (ECOG): 1 - Symptomatic but completely ambulatory Physical Exam Constitutional:      Appearance: Normal appearance.  HENT:     Mouth/Throat:     Pharynx: Oropharynx is clear. No oropharyngeal exudate.  Cardiovascular:     Rate and Rhythm: Normal rate and regular rhythm.     Heart sounds: No murmur heard.    No friction rub. No gallop.  Pulmonary:     Breath sounds: Normal breath sounds.  Chest:  Breasts:    Right: No swelling, bleeding, inverted nipple, mass, nipple discharge or skin change.     Left: No swelling, bleeding, inverted nipple, mass, nipple discharge or skin change.  Abdominal:     General: Bowel sounds are normal. There is no distension.     Palpations: Abdomen is soft. There is no mass.     Tenderness: There is no abdominal  tenderness.  Musculoskeletal:        General: No tenderness.     Cervical back: Normal range of motion and neck supple.     Right lower leg: No edema.     Left lower leg: No edema.  Lymphadenopathy:     Cervical: No cervical adenopathy.     Right cervical: No superficial, deep or posterior cervical adenopathy.    Left cervical: No superficial, deep or posterior cervical adenopathy.     Upper Body:     Right upper body: No supraclavicular or axillary adenopathy.     Left upper body: No supraclavicular or axillary adenopathy.     Lower Body: No right inguinal adenopathy. No left inguinal adenopathy.  Skin:    Coloration: Skin is not jaundiced.     Findings: No lesion or rash.  Neurological:     General: No focal deficit present.     Mental Status: She is alert and oriented to person, place, and time. Mental status is at baseline.  Psychiatric:        Mood and Affect: Mood normal.        Behavior: Behavior normal.        Thought Content: Thought content normal.        Judgment: Judgment normal.   LABS:    ASSESSMENT & PLAN:  Assessment/Plan:  A 76 y.o. female with stage IIIA classical Hodgkin lymphoma, nodular sclerosis subtype.  She  will proceed with cycle 7, day 1 of AVD treatment tomorrow.  She will continue to receive white cells therapy after each treatment to stay on schedule for her biweekly therapy.  Overall, I do believe she is doing fairly well.  I see no decline in her baseline health.  I will see her back in 2 weeks before she heads into cycle 7, day 15 of AVD.  The patient and her family understand all the plans discussed today and are in agreement with them.  Melanie Dubose Macarthur Critchley, MD

## 2022-08-02 ENCOUNTER — Inpatient Hospital Stay: Payer: Medicare HMO | Attending: Oncology

## 2022-08-02 ENCOUNTER — Inpatient Hospital Stay (INDEPENDENT_AMBULATORY_CARE_PROVIDER_SITE_OTHER): Payer: Medicare HMO | Admitting: Oncology

## 2022-08-02 DIAGNOSIS — C8118 Nodular sclerosis classical Hodgkin lymphoma, lymph nodes of multiple sites: Secondary | ICD-10-CM | POA: Diagnosis not present

## 2022-08-02 DIAGNOSIS — Z5111 Encounter for antineoplastic chemotherapy: Secondary | ICD-10-CM | POA: Insufficient documentation

## 2022-08-02 DIAGNOSIS — D649 Anemia, unspecified: Secondary | ICD-10-CM | POA: Diagnosis not present

## 2022-08-02 DIAGNOSIS — Z79899 Other long term (current) drug therapy: Secondary | ICD-10-CM | POA: Insufficient documentation

## 2022-08-02 DIAGNOSIS — Z5189 Encounter for other specified aftercare: Secondary | ICD-10-CM | POA: Insufficient documentation

## 2022-08-02 LAB — CBC AND DIFFERENTIAL
HCT: 28 — AB (ref 36–46)
Hemoglobin: 9.5 — AB (ref 12.0–16.0)
Neutrophils Absolute: 8.53
Platelets: 116 10*3/uL — AB (ref 150–400)
WBC: 10.4

## 2022-08-02 LAB — HEPATIC FUNCTION PANEL
ALT: 21 U/L (ref 7–35)
AST: 25 (ref 13–35)
Alkaline Phosphatase: 115 (ref 25–125)
Bilirubin, Total: 0.3

## 2022-08-02 LAB — COMPREHENSIVE METABOLIC PANEL
Albumin: 3.7 (ref 3.5–5.0)
Calcium: 9 (ref 8.7–10.7)

## 2022-08-02 LAB — BASIC METABOLIC PANEL
BUN: 14 (ref 4–21)
CO2: 27 — AB (ref 13–22)
Chloride: 104 (ref 99–108)
Creatinine: 0.7 (ref 0.5–1.1)
Glucose: 115
Potassium: 3.8 mEq/L (ref 3.5–5.1)
Sodium: 138 (ref 137–147)

## 2022-08-02 LAB — CBC: RBC: 2.94 — AB (ref 3.87–5.11)

## 2022-08-03 ENCOUNTER — Inpatient Hospital Stay: Payer: Medicare HMO

## 2022-08-03 ENCOUNTER — Other Ambulatory Visit: Payer: Self-pay

## 2022-08-03 VITALS — BP 148/74 | HR 88 | Temp 98.0°F | Resp 18 | Ht 62.5 in | Wt 127.0 lb

## 2022-08-03 DIAGNOSIS — Z5189 Encounter for other specified aftercare: Secondary | ICD-10-CM | POA: Diagnosis not present

## 2022-08-03 DIAGNOSIS — C8118 Nodular sclerosis classical Hodgkin lymphoma, lymph nodes of multiple sites: Secondary | ICD-10-CM | POA: Diagnosis present

## 2022-08-03 DIAGNOSIS — Z79899 Other long term (current) drug therapy: Secondary | ICD-10-CM | POA: Diagnosis not present

## 2022-08-03 DIAGNOSIS — Z5111 Encounter for antineoplastic chemotherapy: Secondary | ICD-10-CM | POA: Diagnosis present

## 2022-08-03 MED ORDER — PALONOSETRON HCL INJECTION 0.25 MG/5ML
0.2500 mg | Freq: Once | INTRAVENOUS | Status: AC
  Administered 2022-08-03: 0.25 mg via INTRAVENOUS
  Filled 2022-08-03: qty 5

## 2022-08-03 MED ORDER — VINBLASTINE SULFATE CHEMO INJECTION 1 MG/ML
4.7000 mg/m2 | Freq: Once | INTRAVENOUS | Status: AC
  Administered 2022-08-03: 7.5 mg via INTRAVENOUS
  Filled 2022-08-03: qty 7.5

## 2022-08-03 MED ORDER — SODIUM CHLORIDE 0.9 % IV SOLN
295.0000 mg/m2 | Freq: Once | INTRAVENOUS | Status: AC
  Administered 2022-08-03: 470 mg via INTRAVENOUS
  Filled 2022-08-03: qty 47

## 2022-08-03 MED ORDER — HEPARIN SOD (PORK) LOCK FLUSH 100 UNIT/ML IV SOLN
500.0000 [IU] | Freq: Once | INTRAVENOUS | Status: AC | PRN
  Administered 2022-08-03: 500 [IU]

## 2022-08-03 MED ORDER — SODIUM CHLORIDE 0.9 % IV SOLN: Freq: Once | INTRAVENOUS | Status: AC

## 2022-08-03 MED ORDER — DEXAMETHASONE SODIUM PHOSPHATE 10 MG/ML IJ SOLN
4.0000 mg | Freq: Once | INTRAMUSCULAR | Status: AC
  Administered 2022-08-03: 4 mg via INTRAVENOUS
  Filled 2022-08-03: qty 1

## 2022-08-03 MED ORDER — SODIUM CHLORIDE 0.9% FLUSH
10.0000 mL | INTRAVENOUS | Status: DC | PRN
  Administered 2022-08-03: 10 mL

## 2022-08-03 MED ORDER — DOXORUBICIN HCL CHEMO IV INJECTION 2 MG/ML
18.7500 mg/m2 | Freq: Once | INTRAVENOUS | Status: AC
  Administered 2022-08-03: 30 mg via INTRAVENOUS
  Filled 2022-08-03: qty 15

## 2022-08-03 MED ORDER — SODIUM CHLORIDE 0.9 % IV SOLN
150.0000 mg | Freq: Once | INTRAVENOUS | Status: AC
  Administered 2022-08-03: 150 mg via INTRAVENOUS
  Filled 2022-08-03: qty 150

## 2022-08-03 NOTE — Patient Instructions (Signed)
Vinblastine Injection What is this medication? VINBLASTINE (vin BLAS teen) treats some types of cancer. It works by slowing down the growth of cancer cells. This medicine may be used for other purposes; ask your health care provider or pharmacist if you have questions. COMMON BRAND NAME(S): Velban What should I tell my care team before I take this medication? They need to know if you have any of these conditions: Heart disease Infection Liver disease Low white blood cell levels Lung disease An unusual or allergic reaction to vinblastine, other chemotherapy agents, other medications, foods, dyes, or preservatives Pregnant or trying to get pregnant Breast-feeding How should I use this medication? This medication is injected into a vein. It is given by your care team in a hospital or clinic setting. Talk to your care team about the use of this medication in children. While it may be given to children for selected conditions, precautions do apply. Overdosage: If you think you have taken too much of this medicine contact a poison control center or emergency room at once. NOTE: This medicine is only for you. Do not share this medicine with others. What if I miss a dose? Keep appointments for follow-up doses. It is important not to miss your dose. Call your care team if you are unable to keep an appointment. What may interact with this medication? Erythromycin Phenytoin This medication may affect how other medications work, and other medications may affect the way this medication works. Talk with your care team about all of the medications you take. They may suggest changes to your treatment plan to lower the risk of side effects and to make sure your medications work as intended. This list may not describe all possible interactions. Give your health care provider a list of all the medicines, herbs, non-prescription drugs, or dietary supplements you use. Also tell them if you smoke, drink alcohol,  or use illegal drugs. Some items may interact with your medicine. What should I watch for while using this medication? Your condition will be monitored carefully while you are receiving this medication. This medication may make you feel generally unwell. This is not uncommon as chemotherapy can affect healthy cells as well as cancer cells. Report any side effects. Continue your course of treatment even though you feel ill unless your care team tells you to stop. You may need blood work while taking this medication. This medication will cause constipation. If you do not have a bowel movement for 3 days, call your care team. This medication may increase your risk to bruise or bleed. Call your care team if you notice any unusual bleeding. This medication may increase your risk of getting an infection. Call your care team for advice if you get a fever, chills, sore throat, or other symptoms of a cold or flu. Do not treat yourself. Try to avoid being around people who are sick. Be careful brushing or flossing your teeth or using a toothpick because you may get an infection or bleed more easily. If you have any dental work done, tell your dentist you are receiving this medication. Talk to your care team if you or your partner wish to become pregnant or think either of you might be pregnant. This medication can cause serious birth defects. This medication may cause infertility. Talk to your care team if you are concerned about your fertility. Talk to your care team before breastfeeding. Changes to your treatment plan may be needed. What side effects may I notice from receiving this  medication? Side effects that you should report to your care team as soon as possible: Allergic reactions--skin rash, itching, hives, swelling of the face, lips, tongue, or throat Infection--fever, chills, cough, sore throat, wounds that don't heal, pain or trouble when passing urine, general feeling of discomfort or being  unwell Painful swelling, warmth, or redness of the skin, blisters or sores at the infusion site Shortness of breath or trouble breathing Unusual bruising or bleeding Side effects that usually do not require medical attention (report to your care team if they continue or are bothersome): Bone pain Constipation Hair loss Nausea Stomach pain Vomiting This list may not describe all possible side effects. Call your doctor for medical advice about side effects. You may report side effects to FDA at 1-800-FDA-1088. Where should I keep my medication? This medication is given in a hospital or clinic. It will not be stored at home. NOTE: This sheet is a summary. It may not cover all possible information. If you have questions about this medicine, talk to your doctor, pharmacist, or health care provider.  2023 Elsevier/Gold Standard (2022-02-05 00:00:00) Dacarbazine Injection What is this medication? DACARBAZINE (da KAR ba zeen) treats skin cancer and lymphoma. It works by slowing down the growth of cancer cells. This medicine may be used for other purposes; ask your health care provider or pharmacist if you have questions. COMMON BRAND NAME(S): DTIC-Dome What should I tell my care team before I take this medication? They need to know if you have any of these conditions: Infection, such as chickenpox, cold sores, herpes Kidney disease Liver disease Low blood cell levels, such as low white cells, platelets, or red blood cells Recent radiation therapy An unusual or allergic reaction to dacarbazine, other medications, foods, dyes, or preservatives Pregnant or trying to get pregnant Breast-feeding How should I use this medication? This medication is given as an injected into a vein. It is given by your care team in a hospital or clinic setting. Talk to your care team about the use of this medication in children. While it may be prescribed for selected conditions, precautions do apply. Overdosage:  If you think you have taken too much of this medicine contact a poison control center or emergency room at once. NOTE: This medicine is only for you. Do not share this medicine with others. What if I miss a dose? Keep appointments for follow-up doses. It is important not to miss your dose. Call your care team if you are unable to keep an appointment. What may interact with this medication? Do not take this medication with any of the following: Live virus vaccines This medication may also interact with the following: Medications to increase blood counts, such as filgrastim, pegfilgrastim, sargramostim This list may not describe all possible interactions. Give your health care provider a list of all the medicines, herbs, non-prescription drugs, or dietary supplements you use. Also tell them if you smoke, drink alcohol, or use illegal drugs. Some items may interact with your medicine. What should I watch for while using this medication? Your condition will be monitored carefully while you are receiving this medication. You may need blood work done while taking this medication. This medication may make you feel generally unwell. This is not uncommon as chemotherapy can affect healthy cells as well as cancer cells. Report any side effects. Continue your course of treatment even though you feel ill unless your care team tells you to stop. This medication may increase your risk of getting an infection. Call  your care team for advice if you get a fever, chills, sore throat, or other symptoms of a cold or flu. Do not treat yourself. Try to avoid being around people who are sick. This medication may increase your risk to bruise or bleed. Call your care team if you notice any unusual bleeding. Talk to your care team about your risk of cancer. You may be more at risk for certain types of cancers if you take this medication. Talk to your care team if you wish to become pregnant or think you might be pregnant.  This medication can cause serious birth defects if taken during pregnancy. A reliable form of contraception is recommended while taking this medication. Talk to your care team about effective forms of contraception. Do not breastfeed while taking this medication. What side effects may I notice from receiving this medication? Side effects that you should report to your care team as soon as possible: Allergic reactions--skin rash, itching, hives, swelling of the face, lips, tongue, or throat Infection--fever, chills, cough, sore throat, wounds that don't heal, pain or trouble when passing urine, general feeling of discomfort or being unwell Liver injury--right upper belly pain, loss of appetite, nausea, light-colored stool, dark yellow or brown urine, yellowing skin or eyes, unusual weakness or fatigue Low red blood cell level--unusual weakness or fatigue, dizziness, headache, trouble breathing Painful swelling, warmth, or redness of the skin, blisters or sores at the infusion site Unusual bruising or bleeding Side effects that usually do not require medical attention (report to your care team if they continue or are bothersome): Hair loss Loss of appetite Nausea Vomiting This list may not describe all possible side effects. Call your doctor for medical advice about side effects. You may report side effects to FDA at 1-800-FDA-1088. Where should I keep my medication? This medication is given in a hospital or clinic. It will not be stored at home. NOTE: This sheet is a summary. It may not cover all possible information. If you have questions about this medicine, talk to your doctor, pharmacist, or health care provider.  2023 Elsevier/Gold Standard (2008-01-03 00:00:00) Doxorubicin Injection What is this medication? DOXORUBICIN (dox oh ROO bi sin) treats some types of cancer. It works by slowing down the growth of cancer cells. This medicine may be used for other purposes; ask your health care  provider or pharmacist if you have questions. COMMON BRAND NAME(S): Adriamycin, Adriamycin PFS, Adriamycin RDF, Rubex What should I tell my care team before I take this medication? They need to know if you have any of these conditions: Heart disease History of low blood cell levels caused by a medication Liver disease Recent or ongoing radiation An unusual or allergic reaction to doxorubicin, other medications, foods, dyes, or preservatives If you or your partner are pregnant or trying to get pregnant Breast-feeding How should I use this medication? This medication is injected into a vein. It is given by your care team in a hospital or clinic setting. Talk to your care team about the use of this medication in children. Special care may be needed. Overdosage: If you think you have taken too much of this medicine contact a poison control center or emergency room at once. NOTE: This medicine is only for you. Do not share this medicine with others. What if I miss a dose? Keep appointments for follow-up doses. It is important not to miss your dose. Call your care team if you are unable to keep an appointment. What may interact with  this medication? 6-mercaptopurine Paclitaxel Phenytoin St. John's wort Trastuzumab Verapamil This list may not describe all possible interactions. Give your health care provider a list of all the medicines, herbs, non-prescription drugs, or dietary supplements you use. Also tell them if you smoke, drink alcohol, or use illegal drugs. Some items may interact with your medicine. What should I watch for while using this medication? Your condition will be monitored carefully while you are receiving this medication. You may need blood work while taking this medication. This medication may make you feel generally unwell. This is not uncommon as chemotherapy can affect healthy cells as well as cancer cells. Report any side effects. Continue your course of treatment even  though you feel ill unless your care team tells you to stop. There is a maximum amount of this medication you should receive throughout your life. The amount depends on the medical condition being treated and your overall health. Your care team will watch how much of this medication you receive. Tell your care team if you have taken this medication before. Your urine may turn red for a few days after your dose. This is not blood. If your urine is dark or brown, call your care team. In some cases, you may be given additional medications to help with side effects. Follow all directions for their use. This medication may increase your risk of getting an infection. Call your care team for advice if you get a fever, chills, sore throat, or other symptoms of a cold or flu. Do not treat yourself. Try to avoid being around people who are sick. This medication may increase your risk to bruise or bleed. Call your care team if you notice any unusual bleeding. Talk to your care team about your risk of cancer. You may be more at risk for certain types of cancers if you take this medication. You should make sure that you get enough Coenzyme Q10 while you are taking this medication. Discuss the foods you eat and the vitamins you take with your care team. Talk to your care team if you or your partner may be pregnant. Serious birth defects can occur if you take this medication during pregnancy and for 6 months after the last dose. Contraception is recommended while taking this medication and for 6 months after the last dose. Your care team can help you find the option that works for you. If your partner can get pregnant, use a condom while taking this medication and for 6 months after the last dose. Do not breastfeed while taking this medication. This medication may cause infertility. Talk to your care team if you are concerned about your fertility. What side effects may I notice from receiving this medication? Side  effects that you should report to your care team as soon as possible: Allergic reactions--skin rash, itching, hives, swelling of the face, lips, tongue, or throat Heart failure--shortness of breath, swelling of the ankles, feet, or hands, sudden weight gain, unusual weakness or fatigue Heart rhythm changes--fast or irregular heartbeat, dizziness, feeling faint or lightheaded, chest pain, trouble breathing Infection--fever, chills, cough, sore throat, wounds that don't heal, pain or trouble when passing urine, general feeling of discomfort or being unwell Low red blood cell level--unusual weakness or fatigue, dizziness, headache, trouble breathing Painful swelling, warmth, or redness of the skin, blisters or sores at the infusion site Unusual bruising or bleeding Side effects that usually do not require medical attention (report to your care team if they continue or are bothersome):  Diarrhea Hair loss Nausea Pain, redness, or swelling with sores inside the mouth or throat Red urine This list may not describe all possible side effects. Call your doctor for medical advice about side effects. You may report side effects to FDA at 1-800-FDA-1088. Where should I keep my medication? This medication is given in a hospital or clinic. It will not be stored at home. NOTE: This sheet is a summary. It may not cover all possible information. If you have questions about this medicine, talk to your doctor, pharmacist, or health care provider.  2023 Elsevier/Gold Standard (2022-03-21 00:00:00)

## 2022-08-06 ENCOUNTER — Ambulatory Visit: Payer: Medicare HMO

## 2022-08-06 ENCOUNTER — Inpatient Hospital Stay: Payer: Medicare HMO

## 2022-08-06 VITALS — BP 137/63 | HR 94 | Temp 98.0°F | Resp 20 | Ht 62.5 in | Wt 127.2 lb

## 2022-08-06 DIAGNOSIS — Z5189 Encounter for other specified aftercare: Secondary | ICD-10-CM | POA: Diagnosis not present

## 2022-08-06 DIAGNOSIS — Z79899 Other long term (current) drug therapy: Secondary | ICD-10-CM | POA: Diagnosis not present

## 2022-08-06 DIAGNOSIS — C8118 Nodular sclerosis classical Hodgkin lymphoma, lymph nodes of multiple sites: Secondary | ICD-10-CM

## 2022-08-06 DIAGNOSIS — Z5111 Encounter for antineoplastic chemotherapy: Secondary | ICD-10-CM | POA: Diagnosis not present

## 2022-08-06 MED ORDER — PEGFILGRASTIM-JMDB 6 MG/0.6ML ~~LOC~~ SOSY
6.0000 mg | PREFILLED_SYRINGE | Freq: Once | SUBCUTANEOUS | Status: AC
  Administered 2022-08-06: 6 mg via SUBCUTANEOUS
  Filled 2022-08-06: qty 0.6

## 2022-08-06 NOTE — Patient Instructions (Signed)

## 2022-08-07 DIAGNOSIS — T451X5A Adverse effect of antineoplastic and immunosuppressive drugs, initial encounter: Secondary | ICD-10-CM | POA: Diagnosis not present

## 2022-08-07 DIAGNOSIS — H9192 Unspecified hearing loss, left ear: Secondary | ICD-10-CM | POA: Diagnosis not present

## 2022-08-07 DIAGNOSIS — H6123 Impacted cerumen, bilateral: Secondary | ICD-10-CM | POA: Diagnosis not present

## 2022-08-07 DIAGNOSIS — D84821 Immunodeficiency due to drugs: Secondary | ICD-10-CM | POA: Diagnosis not present

## 2022-08-07 DIAGNOSIS — Z79899 Other long term (current) drug therapy: Secondary | ICD-10-CM | POA: Diagnosis not present

## 2022-08-07 DIAGNOSIS — H61303 Acquired stenosis of external ear canal, unspecified, bilateral: Secondary | ICD-10-CM | POA: Diagnosis not present

## 2022-08-07 DIAGNOSIS — H6592 Unspecified nonsuppurative otitis media, left ear: Secondary | ICD-10-CM | POA: Diagnosis not present

## 2022-08-07 DIAGNOSIS — C8190 Hodgkin lymphoma, unspecified, unspecified site: Secondary | ICD-10-CM | POA: Diagnosis not present

## 2022-08-07 DIAGNOSIS — H6992 Unspecified Eustachian tube disorder, left ear: Secondary | ICD-10-CM | POA: Diagnosis not present

## 2022-08-15 NOTE — Progress Notes (Signed)
[facility]  [address] [place],  [place]  [address] [phone]  Clinic Day:  08/02/2022  Referring physician: Marice Potter, MD  HISTORY OF PRESENT ILLNESS:  The patient is a 76 y.o. female with stage IIIA classical Hodgkin lymphoma.  She comes in today to be evaluated before receiving cycle 7, day 15 of Adriamycin/vinblastine/dacarbazine (AVD) chemotherapy.  Her AVD chemotherapy was preceded by 2 cycles of brentuximab vedotin.  The patient claims to be tolerating her AVD treatments okay, but still feels weak after each treatment is given.  However, she denies any particular changes in her health.  As it pertains to her Hodgkin lymphoma, she denies having any B symptoms or prominent lymphadenopathy which concerns her for refractory disease while on treatment.  PHYSICAL EXAM:  There were no vitals taken for this visit. Wt Readings from Last 3 Encounters:  08/06/22 127 lb 4 oz (57.7 kg)  08/03/22 127 lb (57.6 kg)  08/02/22 127 lb 4.8 oz (57.7 kg)   There is no height or weight on file to calculate BMI. Performance status (ECOG): 1 - Symptomatic but completely ambulatory Physical Exam Constitutional:      Appearance: Normal appearance.  HENT:     Mouth/Throat:     Pharynx: Oropharynx is clear. No oropharyngeal exudate.  Cardiovascular:     Rate and Rhythm: Normal rate and regular rhythm.     Heart sounds: No murmur heard.    No friction rub. No gallop.  Pulmonary:     Breath sounds: Normal breath sounds.  Chest:  Breasts:    Right: No swelling, bleeding, inverted nipple, mass, nipple discharge or skin change.     Left: No swelling, bleeding, inverted nipple, mass, nipple discharge or skin change.  Abdominal:     General: Bowel sounds are normal. There is no distension.     Palpations: Abdomen is soft. There is no mass.     Tenderness: There is no abdominal tenderness.  Musculoskeletal:        General: No tenderness.     Cervical back:  Normal range of motion and neck supple.     Right lower leg: No edema.     Left lower leg: No edema.  Lymphadenopathy:     Cervical: No cervical adenopathy.     Right cervical: No superficial, deep or posterior cervical adenopathy.    Left cervical: No superficial, deep or posterior cervical adenopathy.     Upper Body:     Right upper body: No supraclavicular or axillary adenopathy.     Left upper body: No supraclavicular or axillary adenopathy.     Lower Body: No right inguinal adenopathy. No left inguinal adenopathy.  Skin:    Coloration: Skin is not jaundiced.     Findings: No lesion or rash.  Neurological:     General: No focal deficit present.     Mental Status: She is alert and oriented to person, place, and time. Mental status is at baseline.  Psychiatric:        Mood and Affect: Mood normal.        Behavior: Behavior normal.        Thought Content: Thought content normal.        Judgment: Judgment normal.  LABS:    ASSESSMENT & PLAN:  Assessment/Plan:  A 76 y.o. female with stage IIIA classical Hodgkin lymphoma, nodular sclerosis subtype.  She will proceed with cycle 7, day 1 of AVD treatment tomorrow.  She will continue  to receive white cells therapy after each treatment to stay on schedule for her biweekly therapy.  Overall, I do believe she is doing fairly well.  I see no decline in her baseline health.  I will see her back in 2 weeks before she heads into cycle 7, day 15 of AVD.  The patient and her family understand all the plans discussed today and are in agreement with them.  Shekela Goodridge Macarthur Critchley, MD

## 2022-08-16 ENCOUNTER — Other Ambulatory Visit: Payer: Self-pay | Admitting: Oncology

## 2022-08-16 ENCOUNTER — Inpatient Hospital Stay (INDEPENDENT_AMBULATORY_CARE_PROVIDER_SITE_OTHER): Payer: Medicare HMO | Admitting: Oncology

## 2022-08-16 ENCOUNTER — Other Ambulatory Visit: Payer: Self-pay | Admitting: Hematology and Oncology

## 2022-08-16 ENCOUNTER — Inpatient Hospital Stay: Payer: Medicare HMO

## 2022-08-16 DIAGNOSIS — D6481 Anemia due to antineoplastic chemotherapy: Secondary | ICD-10-CM

## 2022-08-16 DIAGNOSIS — Z5189 Encounter for other specified aftercare: Secondary | ICD-10-CM | POA: Diagnosis not present

## 2022-08-16 DIAGNOSIS — C8118 Nodular sclerosis classical Hodgkin lymphoma, lymph nodes of multiple sites: Secondary | ICD-10-CM

## 2022-08-16 DIAGNOSIS — Z79899 Other long term (current) drug therapy: Secondary | ICD-10-CM | POA: Diagnosis not present

## 2022-08-16 DIAGNOSIS — Z5111 Encounter for antineoplastic chemotherapy: Secondary | ICD-10-CM | POA: Diagnosis not present

## 2022-08-16 LAB — HEPATIC FUNCTION PANEL
ALT: 16 U/L (ref 7–35)
AST: 21 (ref 13–35)
Alkaline Phosphatase: 111 (ref 25–125)
Bilirubin, Total: 0.3

## 2022-08-16 LAB — CBC AND DIFFERENTIAL
HCT: 24 — AB (ref 36–46)
Hemoglobin: 8.3 — AB (ref 12.0–16.0)
Neutrophils Absolute: 4.29
Platelets: 127 10*3/uL — AB (ref 150–400)
WBC: 5.3

## 2022-08-16 LAB — BASIC METABOLIC PANEL
BUN: 13 (ref 4–21)
CO2: 27 — AB (ref 13–22)
Chloride: 107 (ref 99–108)
Creatinine: 0.7 (ref 0.5–1.1)
Glucose: 117
Potassium: 3.5 mEq/L (ref 3.5–5.1)
Sodium: 137 (ref 137–147)

## 2022-08-16 LAB — COMPREHENSIVE METABOLIC PANEL
Albumin: 3.7 (ref 3.5–5.0)
Calcium: 9 (ref 8.7–10.7)

## 2022-08-16 LAB — CBC: RBC: 2.54 — AB (ref 3.87–5.11)

## 2022-08-16 MED ORDER — LORAZEPAM 1 MG PO TABS: 1.0000 mg | ORAL_TABLET | Freq: Three times a day (TID) | ORAL | 0 refills | Status: DC

## 2022-08-16 MED FILL — Vinblastine Sulfate Inj 1 MG/ML: INTRAVENOUS | Qty: 7.5 | Status: AC

## 2022-08-16 MED FILL — Doxorubicin HCl Inj 2 MG/ML: INTRAVENOUS | Qty: 15 | Status: AC

## 2022-08-16 MED FILL — Dacarbazine For Inj 200 MG: INTRAVENOUS | Qty: 47 | Status: AC

## 2022-08-16 MED FILL — Fosaprepitant Dimeglumine For IV Infusion 150 MG (Base Eq): INTRAVENOUS | Qty: 5 | Status: AC

## 2022-08-17 ENCOUNTER — Other Ambulatory Visit: Payer: Self-pay

## 2022-08-17 ENCOUNTER — Inpatient Hospital Stay: Payer: Medicare HMO

## 2022-08-17 VITALS — BP 131/75 | HR 96 | Temp 97.6°F | Resp 18

## 2022-08-17 DIAGNOSIS — Z5189 Encounter for other specified aftercare: Secondary | ICD-10-CM | POA: Diagnosis not present

## 2022-08-17 DIAGNOSIS — D6481 Anemia due to antineoplastic chemotherapy: Secondary | ICD-10-CM

## 2022-08-17 DIAGNOSIS — C8118 Nodular sclerosis classical Hodgkin lymphoma, lymph nodes of multiple sites: Secondary | ICD-10-CM | POA: Diagnosis not present

## 2022-08-17 DIAGNOSIS — Z79899 Other long term (current) drug therapy: Secondary | ICD-10-CM | POA: Diagnosis not present

## 2022-08-17 DIAGNOSIS — Z5111 Encounter for antineoplastic chemotherapy: Secondary | ICD-10-CM | POA: Diagnosis not present

## 2022-08-17 LAB — PREPARE RBC (CROSSMATCH)

## 2022-08-17 MED ORDER — SODIUM CHLORIDE 0.9 % IV SOLN
150.0000 mg | Freq: Once | INTRAVENOUS | Status: AC
  Administered 2022-08-17: 150 mg via INTRAVENOUS
  Filled 2022-08-17: qty 150

## 2022-08-17 MED ORDER — HEPARIN SOD (PORK) LOCK FLUSH 100 UNIT/ML IV SOLN
500.0000 [IU] | Freq: Once | INTRAVENOUS | Status: AC | PRN
  Administered 2022-08-17: 500 [IU]

## 2022-08-17 MED ORDER — DIPHENHYDRAMINE HCL 25 MG PO CAPS
25.0000 mg | ORAL_CAPSULE | Freq: Once | ORAL | Status: AC
  Administered 2022-08-17: 25 mg via ORAL
  Filled 2022-08-17: qty 1

## 2022-08-17 MED ORDER — SODIUM CHLORIDE 0.9 % IV SOLN: Freq: Once | INTRAVENOUS | Status: AC

## 2022-08-17 MED ORDER — ACETAMINOPHEN 325 MG PO TABS
650.0000 mg | ORAL_TABLET | Freq: Once | ORAL | Status: AC
  Administered 2022-08-17: 650 mg via ORAL
  Filled 2022-08-17: qty 2

## 2022-08-17 MED ORDER — DOXORUBICIN HCL CHEMO IV INJECTION 2 MG/ML
18.7500 mg/m2 | Freq: Once | INTRAVENOUS | Status: AC
  Administered 2022-08-17: 30 mg via INTRAVENOUS
  Filled 2022-08-17: qty 15

## 2022-08-17 MED ORDER — DEXAMETHASONE SODIUM PHOSPHATE 10 MG/ML IJ SOLN
4.0000 mg | Freq: Once | INTRAMUSCULAR | Status: AC
  Administered 2022-08-17: 4 mg via INTRAVENOUS
  Filled 2022-08-17: qty 1

## 2022-08-17 MED ORDER — SODIUM CHLORIDE 0.9% FLUSH
10.0000 mL | INTRAVENOUS | Status: DC | PRN
  Administered 2022-08-17: 10 mL

## 2022-08-17 MED ORDER — SODIUM CHLORIDE 0.9% IV SOLUTION
250.0000 mL | Freq: Once | INTRAVENOUS | Status: AC
  Administered 2022-08-17: 250 mL via INTRAVENOUS

## 2022-08-17 MED ORDER — VINBLASTINE SULFATE CHEMO INJECTION 1 MG/ML
4.7000 mg/m2 | Freq: Once | INTRAVENOUS | Status: AC
  Administered 2022-08-17: 7.5 mg via INTRAVENOUS
  Filled 2022-08-17: qty 7.5

## 2022-08-17 MED ORDER — PALONOSETRON HCL INJECTION 0.25 MG/5ML
0.2500 mg | Freq: Once | INTRAVENOUS | Status: AC
  Administered 2022-08-17: 0.25 mg via INTRAVENOUS
  Filled 2022-08-17: qty 5

## 2022-08-17 MED ORDER — SODIUM CHLORIDE 0.9 % IV SOLN
295.0000 mg/m2 | Freq: Once | INTRAVENOUS | Status: AC
  Administered 2022-08-17: 470 mg via INTRAVENOUS
  Filled 2022-08-17: qty 47

## 2022-08-17 NOTE — Patient Instructions (Signed)

## 2022-08-18 ENCOUNTER — Other Ambulatory Visit: Payer: Self-pay

## 2022-08-18 LAB — TYPE AND SCREEN
ABO/RH(D): O POS
Antibody Screen: NEGATIVE
Unit division: 0

## 2022-08-18 LAB — BPAM RBC
Blood Product Expiration Date: 202310242359
ISSUE DATE / TIME: 202309220922
Unit Type and Rh: 5100

## 2022-08-20 ENCOUNTER — Inpatient Hospital Stay: Payer: Medicare HMO

## 2022-08-20 DIAGNOSIS — Z5189 Encounter for other specified aftercare: Secondary | ICD-10-CM | POA: Diagnosis not present

## 2022-08-20 DIAGNOSIS — Z5111 Encounter for antineoplastic chemotherapy: Secondary | ICD-10-CM | POA: Diagnosis not present

## 2022-08-20 DIAGNOSIS — C8118 Nodular sclerosis classical Hodgkin lymphoma, lymph nodes of multiple sites: Secondary | ICD-10-CM

## 2022-08-20 DIAGNOSIS — Z79899 Other long term (current) drug therapy: Secondary | ICD-10-CM | POA: Diagnosis not present

## 2022-08-20 MED ORDER — PEGFILGRASTIM-JMDB 6 MG/0.6ML ~~LOC~~ SOSY
6.0000 mg | PREFILLED_SYRINGE | Freq: Once | SUBCUTANEOUS | Status: AC
  Administered 2022-08-20: 6 mg via SUBCUTANEOUS

## 2022-08-22 ENCOUNTER — Other Ambulatory Visit: Payer: Self-pay | Admitting: Hematology and Oncology

## 2022-08-22 ENCOUNTER — Inpatient Hospital Stay: Payer: Medicare HMO

## 2022-08-22 DIAGNOSIS — C8118 Nodular sclerosis classical Hodgkin lymphoma, lymph nodes of multiple sites: Secondary | ICD-10-CM

## 2022-08-22 DIAGNOSIS — D6481 Anemia due to antineoplastic chemotherapy: Secondary | ICD-10-CM

## 2022-08-23 LAB — CBC AND DIFFERENTIAL
HCT: 28 — AB (ref 36–46)
Hemoglobin: 9.7 — AB (ref 12.0–16.0)
Neutrophils Absolute: 9.12
Platelets: 73 10*3/uL — AB (ref 150–400)
WBC: 9.7

## 2022-08-23 LAB — COMPREHENSIVE METABOLIC PANEL
Albumin: 3.7 (ref 3.5–5.0)
Calcium: 9.2 (ref 8.7–10.7)

## 2022-08-23 LAB — HEPATIC FUNCTION PANEL
ALT: 18 U/L (ref 7–35)
AST: 24 (ref 13–35)
Alkaline Phosphatase: 93 (ref 25–125)
Bilirubin, Total: 0.6

## 2022-08-23 LAB — BASIC METABOLIC PANEL
BUN: 18 (ref 4–21)
CO2: 30 — AB (ref 13–22)
Chloride: 101 (ref 99–108)
Creatinine: 0.7 (ref 0.5–1.1)
Glucose: 153
Potassium: 4 mEq/L (ref 3.5–5.1)
Sodium: 138 (ref 137–147)

## 2022-08-23 LAB — CBC: RBC: 2.98 — AB (ref 3.87–5.11)

## 2022-08-27 ENCOUNTER — Encounter: Payer: Self-pay | Admitting: Oncology

## 2022-08-27 DIAGNOSIS — H6592 Unspecified nonsuppurative otitis media, left ear: Secondary | ICD-10-CM | POA: Diagnosis not present

## 2022-08-27 DIAGNOSIS — H6992 Unspecified Eustachian tube disorder, left ear: Secondary | ICD-10-CM | POA: Diagnosis not present

## 2022-08-27 DIAGNOSIS — H9192 Unspecified hearing loss, left ear: Secondary | ICD-10-CM | POA: Diagnosis not present

## 2022-08-29 NOTE — Progress Notes (Signed)
[facility]  [address] [place],  [place]  [address] [phone]  Clinic Day:  08/30/2022  Referring physician: Marice Potter, MD  HISTORY OF PRESENT ILLNESS:  The patient is a 76 y.o. female with stage IIIA classical Hodgkin lymphoma.  She comes in today to be evaluated before receiving cycle 8, day 1 of Adriamycin/vinblastine/dacarbazine (AVD) chemotherapy.  She comes in today feeling completely fatigued and washed out to where she is neither physically nor mentally able to proceed with additional chemotherapy.  Her family states that she has begun needing help with activities of daily living.  A few days ago, she actually needed a wheelchair for ambulation.  All of these reasons are why she does not feel she can proceed with an eighth cycle of treatment.  As it pertains to her Hodgkin lymphoma, she denies having any B symptoms or prominent lymphadenopathy which concerns her for refractory disease while on treatment.  PHYSICAL EXAM:  Blood pressure (!) 124/58, pulse (!) 111, temperature 98.2 F (36.8 C), resp. rate 14, height 5' 2.5" (1.588 m), weight 122 lb 11.2 oz (55.7 kg), SpO2 96 %. Wt Readings from Last 3 Encounters:  08/30/22 122 lb 11.2 oz (55.7 kg)  08/16/22 126 lb 6.4 oz (57.3 kg)  08/06/22 127 lb 4 oz (57.7 kg)   Body mass index is 22.08 kg/m. Performance status (ECOG): 1 - Symptomatic but completely ambulatory Physical Exam Constitutional:      Appearance: Normal appearance.     Comments: She looks very pale  HENT:     Mouth/Throat:     Pharynx: Oropharynx is clear. No oropharyngeal exudate.  Cardiovascular:     Rate and Rhythm: Normal rate and regular rhythm.     Heart sounds: No murmur heard.    No friction rub. No gallop.  Pulmonary:     Breath sounds: Normal breath sounds.  Chest:  Breasts:    Right: No swelling, bleeding, inverted nipple, mass, nipple discharge or skin change.     Left: No swelling, bleeding, inverted  nipple, mass, nipple discharge or skin change.  Abdominal:     General: Bowel sounds are normal. There is no distension.     Palpations: Abdomen is soft. There is no mass.     Tenderness: There is no abdominal tenderness.  Musculoskeletal:        General: No tenderness.     Cervical back: Normal range of motion and neck supple.     Right lower leg: No edema.     Left lower leg: No edema.  Lymphadenopathy:     Cervical: No cervical adenopathy.     Right cervical: No superficial, deep or posterior cervical adenopathy.    Left cervical: No superficial, deep or posterior cervical adenopathy.     Upper Body:     Right upper body: No supraclavicular or axillary adenopathy.     Left upper body: No supraclavicular or axillary adenopathy.     Lower Body: No right inguinal adenopathy. No left inguinal adenopathy.  Skin:    Coloration: Skin is not jaundiced.     Findings: No lesion or rash.  Neurological:     General: No focal deficit present.     Mental Status: She is alert and oriented to person, place, and time. Mental status is at baseline.  Psychiatric:        Mood and Affect: Mood normal.        Behavior: Behavior normal.  Thought Content: Thought content normal.        Judgment: Judgment normal.   LABS:     ASSESSMENT & PLAN:  Assessment/Plan:  A 76 y.o. female with stage IIIA classical Hodgkin lymphoma, nodular sclerosis subtype.  Based upon the progressive decline in her health, I do not believe this patient will be able to tolerate her 8th and final cycle of AVD.  Overall, this patient has received 2 cycles of brentuximab vedotin, followed by 5 cycles of ABVD.  Previous PET scans showed her to have had a complete response to therapy.  At this point in time, I am concerned about the patient having more of a detriment, than benefit, from her final cycle of AVD chemotherapy.  If her disease comes back, it would likely be to it being very refractory, not because of  under-treatment.   The patient knows to use these next few weeks to physically recuperate and get back to her baseline health.  I will see her back in 6 weeks for repeat clinical assessment.  A PET scan will be done a day before her next visit to ascertain her new disease baseline after the completion of all of her definitive therapy.  The patient and her family understand all the plans discussed today and are in agreement with them.  Melanie Alves Macarthur Critchley, MD

## 2022-08-30 ENCOUNTER — Inpatient Hospital Stay: Payer: Medicare HMO

## 2022-08-30 ENCOUNTER — Telehealth: Payer: Self-pay | Admitting: Oncology

## 2022-08-30 ENCOUNTER — Other Ambulatory Visit: Payer: Self-pay | Admitting: Oncology

## 2022-08-30 ENCOUNTER — Inpatient Hospital Stay: Payer: Medicare HMO | Attending: Oncology | Admitting: Oncology

## 2022-08-30 DIAGNOSIS — C8118 Nodular sclerosis classical Hodgkin lymphoma, lymph nodes of multiple sites: Secondary | ICD-10-CM

## 2022-08-30 DIAGNOSIS — D6481 Anemia due to antineoplastic chemotherapy: Secondary | ICD-10-CM

## 2022-08-30 MED ORDER — MAGIC MOUTHWASH W/LIDOCAINE: 15.0000 mL | Freq: Three times a day (TID) | ORAL | 0 refills | Status: DC | PRN

## 2022-08-30 NOTE — Telephone Encounter (Signed)
08/30/22 next appt scheduled and confirmed with patient

## 2022-08-31 ENCOUNTER — Ambulatory Visit: Payer: Medicare HMO

## 2022-08-31 ENCOUNTER — Other Ambulatory Visit: Payer: Self-pay | Admitting: Oncology

## 2022-08-31 MED ORDER — LORAZEPAM 1 MG PO TABS: 1.0000 mg | ORAL_TABLET | Freq: Three times a day (TID) | ORAL | 0 refills | Status: DC

## 2022-09-03 ENCOUNTER — Ambulatory Visit: Payer: Medicare HMO

## 2022-09-07 DIAGNOSIS — J302 Other seasonal allergic rhinitis: Secondary | ICD-10-CM | POA: Diagnosis not present

## 2022-09-07 DIAGNOSIS — N3000 Acute cystitis without hematuria: Secondary | ICD-10-CM | POA: Diagnosis not present

## 2022-09-07 DIAGNOSIS — R809 Proteinuria, unspecified: Secondary | ICD-10-CM | POA: Diagnosis not present

## 2022-09-07 DIAGNOSIS — Z6821 Body mass index (BMI) 21.0-21.9, adult: Secondary | ICD-10-CM | POA: Diagnosis not present

## 2022-09-07 DIAGNOSIS — E1129 Type 2 diabetes mellitus with other diabetic kidney complication: Secondary | ICD-10-CM | POA: Diagnosis not present

## 2022-09-07 DIAGNOSIS — R053 Chronic cough: Secondary | ICD-10-CM | POA: Diagnosis not present

## 2022-09-13 ENCOUNTER — Ambulatory Visit: Payer: Medicare HMO | Admitting: Oncology

## 2022-09-13 ENCOUNTER — Other Ambulatory Visit: Payer: Medicare HMO

## 2022-09-14 ENCOUNTER — Ambulatory Visit: Payer: Medicare HMO

## 2022-09-17 ENCOUNTER — Ambulatory Visit: Payer: Medicare HMO

## 2022-10-01 DIAGNOSIS — B3731 Acute candidiasis of vulva and vagina: Secondary | ICD-10-CM | POA: Diagnosis not present

## 2022-10-01 DIAGNOSIS — E1129 Type 2 diabetes mellitus with other diabetic kidney complication: Secondary | ICD-10-CM | POA: Diagnosis not present

## 2022-10-01 DIAGNOSIS — Z6821 Body mass index (BMI) 21.0-21.9, adult: Secondary | ICD-10-CM | POA: Diagnosis not present

## 2022-10-01 DIAGNOSIS — R809 Proteinuria, unspecified: Secondary | ICD-10-CM | POA: Diagnosis not present

## 2022-10-10 DIAGNOSIS — C8190 Hodgkin lymphoma, unspecified, unspecified site: Secondary | ICD-10-CM | POA: Diagnosis not present

## 2022-10-10 DIAGNOSIS — C8118 Nodular sclerosis classical Hodgkin lymphoma, lymph nodes of multiple sites: Secondary | ICD-10-CM | POA: Diagnosis not present

## 2022-10-10 NOTE — Progress Notes (Signed)
[facility]  [address] [place],  [place]  [address] [phone]  Clinic Day:  10/11/2022  Referring physician: Marice Potter, MD  HISTORY OF PRESENT ILLNESS:  The patient is a 76 y.o. female with stage IIIA classical Hodgkin lymphoma.  She completed 2 cycles of brentuximab vedotin before completing 5 of 6 planned cycles of Adriamycin/vinblastine/dacarbazine (AVD) chemotherapy in September 2023.  Due to her extreme fatigue and weakness, she did not receive her sixth and final cycle of AVD chemotherapy.  She comes in today to go over her PET scan to ascertain her new disease baseline after the completion of all of her definitive therapy.  Overall, the patient claims to be doing okay.  Although she is still somewhat weak, she feels herself getting better with each successive week out from chemotherapy.  She denies having any B symptoms or lymphadenopathy which concerns her for having refractory Hodgkin lymphoma.  PHYSICAL EXAM:  Blood pressure (!) 188/77, pulse 84, temperature 98 F (36.7 C), resp. rate 14, height 5' 2.5" (1.588 m), weight 121 lb 11.2 oz (55.2 kg), SpO2 100 %. Wt Readings from Last 3 Encounters:  10/11/22 121 lb 11.2 oz (55.2 kg)  08/30/22 122 lb 11.2 oz (55.7 kg)  08/16/22 126 lb 6.4 oz (57.3 kg)   Body mass index is 21.9 kg/m. Performance status (ECOG): 1 - Symptomatic but completely ambulatory Physical Exam Constitutional:      Appearance: Normal appearance.     Comments: She looks stronger versus previous visits.  HENT:     Mouth/Throat:     Pharynx: Oropharynx is clear. No oropharyngeal exudate.  Cardiovascular:     Rate and Rhythm: Normal rate and regular rhythm.     Heart sounds: No murmur heard.    No friction rub. No gallop.  Pulmonary:     Breath sounds: Normal breath sounds.  Chest:  Breasts:    Right: No swelling, bleeding, inverted nipple, mass, nipple discharge or skin change.     Left: No swelling,  bleeding, inverted nipple, mass, nipple discharge or skin change.  Abdominal:     General: Bowel sounds are normal. There is no distension.     Palpations: Abdomen is soft. There is no mass.     Tenderness: There is no abdominal tenderness.  Musculoskeletal:        General: No tenderness.     Cervical back: Normal range of motion and neck supple.     Right lower leg: No edema.     Left lower leg: No edema.  Lymphadenopathy:     Cervical: No cervical adenopathy.     Right cervical: No superficial, deep or posterior cervical adenopathy.    Left cervical: No superficial, deep or posterior cervical adenopathy.     Upper Body:     Right upper body: No supraclavicular or axillary adenopathy.     Left upper body: No supraclavicular or axillary adenopathy.     Lower Body: No right inguinal adenopathy. No left inguinal adenopathy.  Skin:    Coloration: Skin is not jaundiced.     Findings: No lesion or rash.  Neurological:     General: No focal deficit present.     Mental Status: She is alert and oriented to person, place, and time. Mental status is at baseline.  Psychiatric:        Mood and Affect: Mood normal.        Behavior: Behavior normal.  Thought Content: Thought content normal.        Judgment: Judgment normal.   SCANS:  Her PET scan done yesterday revealed the following: FINDINGS: Mediastinal blood pool activity: SUV max 2.3  Liver activity: SUV max 3.1  NECK: No hypermetabolic lymph nodes in the neck.  Incidental CT findings: None.  CHEST: No hypermetabolic mediastinal or hilar nodes. No suspicious pulmonary nodules on the CT scan.  Incidental CT findings: Port in the anterior chest wall with tip in distal SVC.  ABDOMEN/PELVIS: No abnormal hypermetabolic activity within the liver, pancreas, adrenal glands, or spleen. No hypermetabolic lymph nodes in the abdomen or pelvis.  Incidental CT findings: None.  SKELETON: No focal hypermetabolic activity to suggest  skeletal metastasis.  Incidental CT findings: None.  IMPRESSION: 1. No hypermetabolic lymph nodes. [place] 1. 2. No evidence of lymphoma recurrence.  LABS:   Latest Reference Range & Units 10/11/22 10:36  Sodium 135 - 145 mmol/L 137  Potassium 3.5 - 5.1 mmol/L 4.9  Chloride 98 - 111 mmol/L 107  CO2 22 - 32 mmol/L 26  Glucose 70 - 99 mg/dL 120 (H)  BUN 8 - 23 mg/dL 20  Creatinine 0.44 - 1.00 mg/dL 0.99  Calcium 8.9 - 10.3 mg/dL 9.1  Anion gap 5 - 15  4 (L)  Alkaline Phosphatase 38 - 126 U/L 81  Albumin 3.5 - 5.0 g/dL 4.0  AST 15 - 41 U/L 24  ALT 0 - 44 U/L 15  Total Protein 6.5 - 8.1 g/dL 6.4 (L)  Total Bilirubin 0.3 - 1.2 mg/dL 0.5  GFR, Est Non African American >60 mL/min 60 (L)  (H): Data is abnormally high (L): Data is abnormally low  Latest Reference Range & Units 10/11/22 11:30  LDH 98 - 192 U/L 153  Iron 28 - 170 ug/dL 76  UIBC ug/dL 205  TIBC 250 - 450 ug/dL 281  Saturation Ratios 10.4 - 31.8 % 27  Ferritin 11 - 307 ng/mL Patient's   Folate >5.9 ng/mL   Vitamin B12 180 - 914 pg/mL   (H): Data is abnormally high (L): Data is abnormally low  ASSESSMENT & PLAN:  Assessment/Plan:  A 76 y.o. female with stage IIIA classical Hodgkin lymphoma, nodular sclerosis subtype.  In clinic today, I went over all of her PET scan images with her, for which she could see there was no hypermetabolic activity with any of her previous areas of active disease.  Based upon these findings, it appears the patient has had a complete response to her chemotherapy.  Understandably, the patient and her family were pleased with her PET scan images.  Moving forward, I will follow her with labs and physical exams every 4 months to ensure there remains no evidence of disease recurrence.  Of note, the patient does have a low folic acid level.  I will tell her to start taking at least 381 mcg of folic acid daily.  Hopefully, this will lead to an improvement in her peripheral counts.  Ultimately, the  major reason why her peripheral counts are likely low is because her bone marrow is likely still recuperating from all of the chemotherapy she received over the past 6 months for her Hodgkin lymphoma.  I will see her back in March 2024 for repeat clinical assessment.  The patient understands all the plans discussed today and is in agreement with them.    Conall Vangorder Macarthur Critchley, MD

## 2022-10-11 ENCOUNTER — Inpatient Hospital Stay: Payer: Medicare HMO

## 2022-10-11 ENCOUNTER — Inpatient Hospital Stay: Payer: Medicare HMO | Attending: Oncology | Admitting: Oncology

## 2022-10-11 ENCOUNTER — Other Ambulatory Visit: Payer: Self-pay | Admitting: Oncology

## 2022-10-11 VITALS — BP 188/77 | HR 84 | Temp 98.0°F | Resp 14 | Ht 62.5 in | Wt 121.7 lb

## 2022-10-11 DIAGNOSIS — C8118 Nodular sclerosis classical Hodgkin lymphoma, lymph nodes of multiple sites: Secondary | ICD-10-CM

## 2022-10-11 DIAGNOSIS — D649 Anemia, unspecified: Secondary | ICD-10-CM | POA: Diagnosis not present

## 2022-10-11 LAB — CMP (CANCER CENTER ONLY)
ALT: 15 U/L (ref 0–44)
AST: 24 U/L (ref 15–41)
Albumin: 4 g/dL (ref 3.5–5.0)
Alkaline Phosphatase: 81 U/L (ref 38–126)
Anion gap: 4 — ABNORMAL LOW (ref 5–15)
BUN: 20 mg/dL (ref 8–23)
CO2: 26 mmol/L (ref 22–32)
Calcium: 9.1 mg/dL (ref 8.9–10.3)
Chloride: 107 mmol/L (ref 98–111)
Creatinine: 0.99 mg/dL (ref 0.44–1.00)
GFR, Estimated: 60 mL/min — ABNORMAL LOW (ref >60)
Glucose, Bld: 120 mg/dL — ABNORMAL HIGH (ref 70–99)
Potassium: 4.9 mmol/L (ref 3.5–5.1)
Sodium: 137 mmol/L (ref 135–145)
Total Bilirubin: 0.5 mg/dL (ref 0.3–1.2)
Total Protein: 6.4 g/dL — ABNORMAL LOW (ref 6.5–8.1)

## 2022-10-11 LAB — FERRITIN: Ferritin: 572 ng/mL — ABNORMAL HIGH (ref 11–307)

## 2022-10-11 LAB — VITAMIN B12: Vitamin B-12: 356 pg/mL (ref 180–914)

## 2022-10-11 LAB — IRON AND TIBC
Iron: 76 ug/dL (ref 28–170)
Saturation Ratios: 27 % (ref 10.4–31.8)
TIBC: 281 ug/dL (ref 250–450)
UIBC: 205 ug/dL

## 2022-10-11 LAB — FOLATE: Folate: 4.6 ng/mL — ABNORMAL LOW (ref >5.9)

## 2022-10-11 LAB — LACTATE DEHYDROGENASE: LDH: 153 U/L (ref 98–192)

## 2022-10-15 ENCOUNTER — Encounter: Payer: Self-pay | Admitting: Oncology

## 2022-10-15 DIAGNOSIS — Z6820 Body mass index (BMI) 20.0-20.9, adult: Secondary | ICD-10-CM | POA: Diagnosis not present

## 2022-10-15 DIAGNOSIS — N760 Acute vaginitis: Secondary | ICD-10-CM | POA: Diagnosis not present

## 2022-10-15 DIAGNOSIS — R300 Dysuria: Secondary | ICD-10-CM | POA: Diagnosis not present

## 2022-10-15 DIAGNOSIS — N3001 Acute cystitis with hematuria: Secondary | ICD-10-CM | POA: Diagnosis not present

## 2022-10-15 DIAGNOSIS — J029 Acute pharyngitis, unspecified: Secondary | ICD-10-CM | POA: Diagnosis not present

## 2022-11-01 DIAGNOSIS — N390 Urinary tract infection, site not specified: Secondary | ICD-10-CM | POA: Diagnosis not present

## 2022-11-01 DIAGNOSIS — N760 Acute vaginitis: Secondary | ICD-10-CM | POA: Diagnosis not present

## 2022-11-01 DIAGNOSIS — Z6820 Body mass index (BMI) 20.0-20.9, adult: Secondary | ICD-10-CM | POA: Diagnosis not present

## 2022-11-01 DIAGNOSIS — R221 Localized swelling, mass and lump, neck: Secondary | ICD-10-CM | POA: Diagnosis not present

## 2022-11-07 DIAGNOSIS — E042 Nontoxic multinodular goiter: Secondary | ICD-10-CM | POA: Diagnosis not present

## 2022-11-07 DIAGNOSIS — R221 Localized swelling, mass and lump, neck: Secondary | ICD-10-CM | POA: Diagnosis not present

## 2022-11-12 DIAGNOSIS — H018 Other specified inflammations of eyelid: Secondary | ICD-10-CM | POA: Diagnosis not present

## 2022-12-03 DIAGNOSIS — E042 Nontoxic multinodular goiter: Secondary | ICD-10-CM | POA: Diagnosis not present

## 2022-12-03 DIAGNOSIS — M0579 Rheumatoid arthritis with rheumatoid factor of multiple sites without organ or systems involvement: Secondary | ICD-10-CM | POA: Diagnosis not present

## 2022-12-03 DIAGNOSIS — C8114 Nodular sclerosis classical Hodgkin lymphoma, lymph nodes of axilla and upper limb: Secondary | ICD-10-CM | POA: Diagnosis not present

## 2022-12-03 DIAGNOSIS — E1129 Type 2 diabetes mellitus with other diabetic kidney complication: Secondary | ICD-10-CM | POA: Diagnosis not present

## 2022-12-03 DIAGNOSIS — Z6821 Body mass index (BMI) 21.0-21.9, adult: Secondary | ICD-10-CM | POA: Diagnosis not present

## 2022-12-03 DIAGNOSIS — E538 Deficiency of other specified B group vitamins: Secondary | ICD-10-CM | POA: Diagnosis not present

## 2022-12-03 DIAGNOSIS — R221 Localized swelling, mass and lump, neck: Secondary | ICD-10-CM | POA: Diagnosis not present

## 2022-12-03 DIAGNOSIS — R809 Proteinuria, unspecified: Secondary | ICD-10-CM | POA: Diagnosis not present

## 2022-12-09 ENCOUNTER — Other Ambulatory Visit: Payer: Self-pay | Admitting: Oncology

## 2022-12-09 DIAGNOSIS — C8118 Nodular sclerosis classical Hodgkin lymphoma, lymph nodes of multiple sites: Secondary | ICD-10-CM

## 2022-12-09 NOTE — Progress Notes (Signed)
[facility]  [address] [place],  [place]  [address] [phone]  Clinic Day: 12/10/2022  Referring physician: Marice Potter, MD  HISTORY OF PRESENT ILLNESS:  The patient is a 77 y.o. female with stage IIIA classical Hodgkin lymphoma.  She completed 2 cycles of brentuximab vedotin before completing 5 of 6 planned cycles of Adriamycin/vinblastine/dacarbazine (AVD) chemotherapy in September 2023.  Due to her extreme fatigue and weakness, she did not receive her sixth and final cycle of AVD chemotherapy.  She comes in today as recent labs at her primary care office showed persistent pancytopenia.  Furthermore, her vitamin B12 level was low at 191.  The patient claims she remains somewhat weak.  Although she is able to ambulate within her home, she needs more help with movement when she is in larger areas.  As a pertains to her Hodgkin lymphoma, she denies having any B symptoms or lymphadenopathy which concerns her for early disease recurrence.    PHYSICAL EXAM:  Blood pressure (!) 144/101, pulse 89, temperature 98.5 F (36.9 C), resp. rate 14, height 5' 2.5" (1.588 m), weight 123 lb 8 oz (56 kg), SpO2 100 %. Wt Readings from Last 3 Encounters:  12/10/22 123 lb 8 oz (56 kg)  10/11/22 121 lb 11.2 oz (55.2 kg)  08/30/22 122 lb 11.2 oz (55.7 kg)   Body mass index is 22.23 kg/m. Performance status (ECOG): 1 - Symptomatic but completely ambulatory Physical Exam Constitutional:      Appearance: Normal appearance.     Comments: She looks stronger versus previous visits.  HENT:     Mouth/Throat:     Pharynx: Oropharynx is clear. No oropharyngeal exudate.  Cardiovascular:     Rate and Rhythm: Normal rate and regular rhythm.     Heart sounds: No murmur heard.    No friction rub. No gallop.  Pulmonary:     Breath sounds: Normal breath sounds.  Chest:  Breasts:    Right: No swelling, bleeding, inverted nipple, mass, nipple discharge or skin change.      Left: No swelling, bleeding, inverted nipple, mass, nipple discharge or skin change.  Abdominal:     General: Bowel sounds are normal. There is no distension.     Palpations: Abdomen is soft. There is no mass.     Tenderness: There is no abdominal tenderness.  Musculoskeletal:        General: No tenderness.     Cervical back: Normal range of motion and neck supple.     Right lower leg: No edema.     Left lower leg: No edema.  Lymphadenopathy:     Cervical: No cervical adenopathy.     Right cervical: No superficial, deep or posterior cervical adenopathy.    Left cervical: No superficial, deep or posterior cervical adenopathy.     Upper Body:     Right upper body: No supraclavicular or axillary adenopathy.     Left upper body: No supraclavicular or axillary adenopathy.     Lower Body: No right inguinal adenopathy. No left inguinal adenopathy.  Skin:    Coloration: Skin is not jaundiced.     Findings: No lesion or rash.  Neurological:     General: No focal deficit present.     Mental Status: She is alert and oriented to person, place, and time. Mental status is at baseline.  Psychiatric:        Mood and Affect: Mood normal.        Behavior:  Behavior normal.        Thought Content: Thought content normal.        Judgment: Judgment normal.    ASSESSMENT & PLAN:  Assessment/Plan:  A 77 y.o. female with stage IIIA classical Hodgkin lymphoma, nodular sclerosis subtype.  In clinic today, I reassured the patient that her recent peripheral counts at her primary care office are not terribly low.  She is only minimally pancytopenic.  This could be explained by her being vitamin B12 deficient.  Based upon this, I will place her on a protracted course of B12 injections where she will receive 1000 mcg daily x 7 days, then weekly x 4 weeks, then monthly indefinitely.  The patient brings to my attention that she has had B12 deficiency before, but did not stick to her monthly B12 injections.  Our office  will try to get this series of B12 shots started as soon as possible.  Otherwise, the patient knows to keep her appointment with me in March 2024 for her continued hospital lymphoma surveillance.  The patient understands all the plans discussed today and is in agreement with them.    Alem Fahl Macarthur Critchley, MD

## 2022-12-10 ENCOUNTER — Inpatient Hospital Stay: Payer: Medicare HMO | Attending: Oncology | Admitting: Oncology

## 2022-12-10 VITALS — BP 144/101 | HR 89 | Temp 98.5°F | Resp 14 | Ht 62.5 in | Wt 123.5 lb

## 2022-12-10 DIAGNOSIS — C8108 Nodular lymphocyte predominant Hodgkin lymphoma, lymph nodes of multiple sites: Secondary | ICD-10-CM | POA: Diagnosis not present

## 2022-12-10 DIAGNOSIS — E538 Deficiency of other specified B group vitamins: Secondary | ICD-10-CM | POA: Insufficient documentation

## 2022-12-11 ENCOUNTER — Other Ambulatory Visit: Payer: Self-pay

## 2022-12-12 ENCOUNTER — Inpatient Hospital Stay: Payer: Medicare HMO

## 2022-12-12 VITALS — BP 149/74 | HR 90 | Temp 97.8°F | Resp 16 | Wt 121.0 lb

## 2022-12-12 DIAGNOSIS — E538 Deficiency of other specified B group vitamins: Secondary | ICD-10-CM

## 2022-12-12 MED ORDER — CYANOCOBALAMIN 1000 MCG/ML IJ SOLN
1000.0000 ug | Freq: Once | INTRAMUSCULAR | Status: AC
  Administered 2022-12-12: 1000 ug via INTRAMUSCULAR
  Filled 2022-12-12: qty 1

## 2022-12-12 NOTE — Patient Instructions (Signed)

## 2022-12-13 ENCOUNTER — Inpatient Hospital Stay: Payer: Medicare HMO

## 2022-12-13 ENCOUNTER — Encounter: Payer: Self-pay | Admitting: Oncology

## 2022-12-13 VITALS — BP 132/47 | HR 91 | Temp 97.8°F | Resp 18

## 2022-12-13 DIAGNOSIS — E538 Deficiency of other specified B group vitamins: Secondary | ICD-10-CM | POA: Diagnosis not present

## 2022-12-13 MED ORDER — CYANOCOBALAMIN 1000 MCG/ML IJ SOLN
1000.0000 ug | Freq: Once | INTRAMUSCULAR | Status: AC
  Administered 2022-12-13: 1000 ug via INTRAMUSCULAR
  Filled 2022-12-13: qty 1

## 2022-12-13 NOTE — Patient Instructions (Signed)

## 2022-12-14 ENCOUNTER — Inpatient Hospital Stay: Payer: Medicare HMO

## 2022-12-14 VITALS — BP 146/64 | HR 92 | Temp 98.0°F | Resp 18

## 2022-12-14 DIAGNOSIS — E538 Deficiency of other specified B group vitamins: Secondary | ICD-10-CM | POA: Diagnosis not present

## 2022-12-14 MED ORDER — CYANOCOBALAMIN 1000 MCG/ML IJ SOLN
1000.0000 ug | Freq: Once | INTRAMUSCULAR | Status: AC
  Administered 2022-12-14: 1000 ug via INTRAMUSCULAR
  Filled 2022-12-14: qty 1

## 2022-12-14 NOTE — Patient Instructions (Signed)

## 2022-12-17 ENCOUNTER — Inpatient Hospital Stay: Payer: Medicare HMO

## 2022-12-17 VITALS — BP 150/59 | HR 80 | Temp 98.1°F | Resp 18 | Ht 62.5 in | Wt 125.8 lb

## 2022-12-17 DIAGNOSIS — E538 Deficiency of other specified B group vitamins: Secondary | ICD-10-CM

## 2022-12-17 MED ORDER — CYANOCOBALAMIN 1000 MCG/ML IJ SOLN
1000.0000 ug | Freq: Once | INTRAMUSCULAR | Status: AC
  Administered 2022-12-17: 1000 ug via INTRAMUSCULAR
  Filled 2022-12-17: qty 1

## 2022-12-17 NOTE — Patient Instructions (Signed)

## 2022-12-18 ENCOUNTER — Inpatient Hospital Stay: Payer: Medicare HMO

## 2022-12-18 VITALS — BP 122/58 | HR 82 | Temp 98.2°F | Resp 16 | Wt 126.0 lb

## 2022-12-18 DIAGNOSIS — E538 Deficiency of other specified B group vitamins: Secondary | ICD-10-CM

## 2022-12-18 MED ORDER — CYANOCOBALAMIN 1000 MCG/ML IJ SOLN
1000.0000 ug | Freq: Once | INTRAMUSCULAR | Status: AC
  Administered 2022-12-18: 1000 ug via INTRAMUSCULAR
  Filled 2022-12-18: qty 1

## 2022-12-18 NOTE — Patient Instructions (Signed)

## 2022-12-19 ENCOUNTER — Inpatient Hospital Stay: Payer: Medicare HMO

## 2022-12-19 VITALS — BP 174/71 | HR 95 | Temp 98.3°F | Resp 18 | Wt 126.8 lb

## 2022-12-19 DIAGNOSIS — E538 Deficiency of other specified B group vitamins: Secondary | ICD-10-CM | POA: Diagnosis not present

## 2022-12-19 MED ORDER — CYANOCOBALAMIN 1000 MCG/ML IJ SOLN
1000.0000 ug | Freq: Once | INTRAMUSCULAR | Status: AC
  Administered 2022-12-19: 1000 ug via INTRAMUSCULAR
  Filled 2022-12-19: qty 1

## 2022-12-19 MED ORDER — CYANOCOBALAMIN 1000 MCG/ML IJ SOLN: 1000.0000 ug | Freq: Once | INTRAMUSCULAR | Status: DC

## 2022-12-19 NOTE — Patient Instructions (Signed)

## 2022-12-20 ENCOUNTER — Inpatient Hospital Stay: Payer: Medicare HMO

## 2022-12-20 VITALS — BP 142/66 | HR 92 | Temp 98.0°F | Resp 16

## 2022-12-20 DIAGNOSIS — E538 Deficiency of other specified B group vitamins: Secondary | ICD-10-CM

## 2022-12-20 MED ORDER — CYANOCOBALAMIN 1000 MCG/ML IJ SOLN
1000.0000 ug | Freq: Once | INTRAMUSCULAR | Status: AC
  Administered 2022-12-20: 1000 ug via INTRAMUSCULAR
  Filled 2022-12-20: qty 1

## 2022-12-20 NOTE — Patient Instructions (Signed)

## 2022-12-27 ENCOUNTER — Encounter: Payer: Self-pay | Admitting: Oncology

## 2022-12-27 ENCOUNTER — Ambulatory Visit: Payer: Medicare HMO

## 2022-12-28 ENCOUNTER — Inpatient Hospital Stay: Payer: Medicare HMO | Attending: Oncology

## 2022-12-28 VITALS — BP 141/75 | HR 97 | Temp 98.2°F | Resp 18

## 2022-12-28 DIAGNOSIS — E538 Deficiency of other specified B group vitamins: Secondary | ICD-10-CM | POA: Diagnosis not present

## 2022-12-28 MED ORDER — CYANOCOBALAMIN 1000 MCG/ML IJ SOLN
1000.0000 ug | Freq: Once | INTRAMUSCULAR | Status: AC
  Administered 2022-12-28: 1000 ug via INTRAMUSCULAR
  Filled 2022-12-28: qty 1

## 2022-12-28 NOTE — Patient Instructions (Signed)
Vitamin B12 Deficiency Vitamin B12 deficiency means that your body does not have enough vitamin B12. The body needs this important vitamin: To make red blood cells. To make genes (DNA). To help the nerves work. If you do not have enough vitamin B12 in your body, you can have health problems, such as not having enough red blood cells in the blood (anemia). What are the causes? Not eating enough foods that contain vitamin B12. Not being able to take in (absorb) vitamin B12 from the food that you eat. Certain diseases. A condition in which the body does not make enough of a certain protein. This results in your body not taking in enough vitamin B12. Having a surgery in which part of the stomach or small intestine is taken out. Taking medicines that make it hard for the body to take in vitamin B12. These include: Heartburn medicines. Some medicines that are used to treat diabetes. What increases the risk? Being an older adult. Eating a vegetarian or vegan diet that does not include any foods that come from animals. Not eating enough foods that contain vitamin B12 while you are pregnant. Taking certain medicines. Having alcoholism. What are the signs or symptoms? In some cases, there are no symptoms. If the condition leads to too few blood cells or nerve damage, symptoms can occur, such as: Feeling weak or tired. Not being hungry. Losing feeling (numbness) or tingling in your hands and feet. Redness and burning of the tongue. Feeling sad (depressed). Confusion or memory problems. Trouble walking. If anemia is very bad, symptoms can include: Being short of breath. Being dizzy. Having a very fast heartbeat. How is this treated? Changing the way you eat and drink, such as: Eating more foods that contain vitamin B12. Drinking little or no alcohol. Getting vitamin B12 shots. Taking vitamin B12 supplements by mouth (orally). Your doctor will tell you the dose that is best for you. Follow  these instructions at home: Eating and drinking  Eat foods that come from animals and have a lot of vitamin B12 in them. These include: Meats and poultry. This includes beef, pork, chicken, turkey, and organ meats, such as liver. Seafood, such as clams, rainbow trout, salmon, tuna, and haddock. Eggs. Dairy foods such as milk, yogurt, and cheese. Eat breakfast cereals that have vitamin B12 added to them (are fortified). Check the label. The items listed above may not be a complete list of foods and beverages you can eat and drink. Contact a dietitian for more information. Alcohol use Do not drink alcohol if: Your doctor tells you not to drink. You are pregnant, may be pregnant, or are planning to become pregnant. If you drink alcohol: Limit how much you have to: 0-1 drink a day for women. 0-2 drinks a day for men. Know how much alcohol is in your drink. In the [place] one drink equals one 12 oz bottle of beer (355 mL), one 5 oz glass of wine (148 mL), or one 1 oz glass of hard liquor (44 mL). General instructions Get any vitamin B12 shots if told by your doctor. Take supplements only as told by your doctor. Follow the directions. Keep all follow-up visits. Contact a doctor if: Your symptoms come back. Your symptoms get worse or do not get better with treatment. Get help right away if: You have trouble breathing. You have a very fast heartbeat. You have chest pain. You get dizzy. You faint. These symptoms may be an emergency. Get help right away. Call 911.   Do not wait to see if the symptoms will go away. Do not drive yourself to the hospital. Summary Vitamin B12 deficiency means that your body is not getting enough of the vitamin. In some cases, there are no symptoms of this condition. Treatment may include making a change in the way you eat and drink, getting shots, or taking supplements. Eat foods that have vitamin B12 in them. This information is not intended to replace advice  given to you by your health care provider. Make sure you discuss any questions you have with your health care provider. Document Revised: 07/07/2021 Document Reviewed: 07/07/2021 Elsevier Patient Education  2023 [employer] Inc[employer]  

## 2023-01-03 ENCOUNTER — Inpatient Hospital Stay: Payer: Medicare HMO

## 2023-01-03 VITALS — BP 168/76 | HR 94 | Temp 97.8°F | Resp 16

## 2023-01-03 DIAGNOSIS — E538 Deficiency of other specified B group vitamins: Secondary | ICD-10-CM | POA: Diagnosis not present

## 2023-01-03 MED ORDER — CYANOCOBALAMIN 1000 MCG/ML IJ SOLN
1000.0000 ug | Freq: Once | INTRAMUSCULAR | Status: AC
  Administered 2023-01-03: 1000 ug via INTRAMUSCULAR
  Filled 2023-01-03: qty 1

## 2023-01-03 NOTE — Patient Instructions (Signed)

## 2023-01-10 ENCOUNTER — Inpatient Hospital Stay: Payer: Medicare HMO

## 2023-01-10 VITALS — BP 152/59 | HR 86 | Temp 98.1°F | Resp 18

## 2023-01-10 DIAGNOSIS — E538 Deficiency of other specified B group vitamins: Secondary | ICD-10-CM | POA: Diagnosis not present

## 2023-01-10 MED ORDER — CYANOCOBALAMIN 1000 MCG/ML IJ SOLN
1000.0000 ug | Freq: Once | INTRAMUSCULAR | Status: AC
  Administered 2023-01-10: 1000 ug via INTRAMUSCULAR
  Filled 2023-01-10: qty 1

## 2023-01-10 NOTE — Patient Instructions (Signed)

## 2023-01-17 ENCOUNTER — Inpatient Hospital Stay: Payer: Medicare HMO

## 2023-01-17 VITALS — BP 150/63 | HR 87 | Temp 98.0°F | Resp 16

## 2023-01-17 DIAGNOSIS — E538 Deficiency of other specified B group vitamins: Secondary | ICD-10-CM | POA: Diagnosis not present

## 2023-01-17 MED ORDER — CYANOCOBALAMIN 1000 MCG/ML IJ SOLN
1000.0000 ug | Freq: Once | INTRAMUSCULAR | Status: AC
  Administered 2023-01-17: 1000 ug via INTRAMUSCULAR
  Filled 2023-01-17: qty 1

## 2023-01-17 NOTE — Patient Instructions (Signed)
Vitamin B12 Deficiency Vitamin B12 deficiency means that your body does not have enough vitamin B12. The body needs this important vitamin: To make red blood cells. To make genes (DNA). To help the nerves work. If you do not have enough vitamin B12 in your body, you can have health problems, such as not having enough red blood cells in the blood (anemia). What are the causes? Not eating enough foods that contain vitamin B12. Not being able to take in (absorb) vitamin B12 from the food that you eat. Certain diseases. A condition in which the body does not make enough of a certain protein. This results in your body not taking in enough vitamin B12. Having a surgery in which part of the stomach or small intestine is taken out. Taking medicines that make it hard for the body to take in vitamin B12. These include: Heartburn medicines. Some medicines that are used to treat diabetes. What increases the risk? Being an older adult. Eating a vegetarian or vegan diet that does not include any foods that come from animals. Not eating enough foods that contain vitamin B12 while you are pregnant. Taking certain medicines. Having alcoholism. What are the signs or symptoms? In some cases, there are no symptoms. If the condition leads to too few blood cells or nerve damage, symptoms can occur, such as: Feeling weak or tired. Not being hungry. Losing feeling (numbness) or tingling in your hands and feet. Redness and burning of the tongue. Feeling sad (depressed). Confusion or memory problems. Trouble walking. If anemia is very bad, symptoms can include: Being short of breath. Being dizzy. Having a very fast heartbeat. How is this treated? Changing the way you eat and drink, such as: Eating more foods that contain vitamin B12. Drinking little or no alcohol. Getting vitamin B12 shots. Taking vitamin B12 supplements by mouth (orally). Your doctor will tell you the dose that is best for you. Follow  these instructions at home: Eating and drinking  Eat foods that come from animals and have a lot of vitamin B12 in them. These include: Meats and poultry. This includes beef, pork, chicken, turkey, and organ meats, such as liver. Seafood, such as clams, rainbow trout, salmon, tuna, and haddock. Eggs. Dairy foods such as milk, yogurt, and cheese. Eat breakfast cereals that have vitamin B12 added to them (are fortified). Check the label. The items listed above may not be a complete list of foods and beverages you can eat and drink. Contact a dietitian for more information. Alcohol use Do not drink alcohol if: Your doctor tells you not to drink. You are pregnant, may be pregnant, or are planning to become pregnant. If you drink alcohol: Limit how much you have to: 0-1 drink a day for women. 0-2 drinks a day for men. Know how much alcohol is in your drink. In the [place] one drink equals one 12 oz bottle of beer (355 mL), one 5 oz glass of wine (148 mL), or one 1 oz glass of hard liquor (44 mL). General instructions Get any vitamin B12 shots if told by your doctor. Take supplements only as told by your doctor. Follow the directions. Keep all follow-up visits. Contact a doctor if: Your symptoms come back. Your symptoms get worse or do not get better with treatment. Get help right away if: You have trouble breathing. You have a very fast heartbeat. You have chest pain. You get dizzy. You faint. These symptoms may be an emergency. Get help right away. Call 911.   Do not wait to see if the symptoms will go away. Do not drive yourself to the hospital. Summary Vitamin B12 deficiency means that your body is not getting enough of the vitamin. In some cases, there are no symptoms of this condition. Treatment may include making a change in the way you eat and drink, getting shots, or taking supplements. Eat foods that have vitamin B12 in them. This information is not intended to replace advice  given to you by your health care provider. Make sure you discuss any questions you have with your health care provider. Document Revised: 07/07/2021 Document Reviewed: 07/07/2021 Elsevier Patient Education  2023 [employer] Inc[employer]  

## 2023-02-07 NOTE — Progress Notes (Signed)
[facility]  [address] [place],  [place]  [address] [phone]  Clinic Day: 02/08/2023  Referring physician: Marice Potter, MD  HISTORY OF PRESENT ILLNESS:  The patient is a 77 y.o. female with stage IIIA classical Hodgkin lymphoma.  She completed 2 cycles of brentuximab vedotin before completing 5 of 6 planned cycles of Adriamycin/vinblastine/dacarbazine (AVD) chemotherapy in September 2023.  Due to her extreme fatigue and weakness, she did not receive her sixth and final cycle of AVD chemotherapy.  Her PET scan done after her therapy showed her to have had a complete response.  She comes in today for routine follow-up.  Since her last visit, the patient has felt better.  She is receiving both B12 injections for her B12 deficiency and is taking folate tabs daily for her folic acid deficiency.  Her major concern today is dizziness/balance problems she attributes to tendonitis in her left ear.  She also has left breast discomfort.  As it pertains to her Hodgkin lymphoma, she denies having any B symptoms or lymphadenopathy which concerns her for early disease recurrence.    PHYSICAL EXAM:  Blood pressure (!) 190/86, pulse 81, temperature 97.9 F (36.6 C), resp. rate 18, height 5' 2.5" (1.588 m), weight 127 lb 9.6 oz (57.9 kg), SpO2 98 %. Wt Readings from Last 3 Encounters:  02/08/23 127 lb 9.6 oz (57.9 kg)  12/19/22 126 lb 12 oz (57.5 kg)  12/18/22 126 lb (57.2 kg)   Body mass index is 22.97 kg/m. Performance status (ECOG): 1 - Symptomatic but completely ambulatory Physical Exam Constitutional:      Appearance: Normal appearance.     Comments: She looks stronger versus previous visits.  HENT:     Mouth/Throat:     Pharynx: Oropharynx is clear. No oropharyngeal exudate.  Cardiovascular:     Rate and Rhythm: Normal rate and regular rhythm.     Heart sounds: No murmur heard.    No friction rub. No gallop.  Pulmonary:     Breath sounds: Normal  breath sounds.  Chest:  Breasts:    Right: No swelling, bleeding, inverted nipple, mass, nipple discharge or skin change.     Left: No swelling, bleeding, inverted nipple, mass, nipple discharge or skin change.  Abdominal:     General: Bowel sounds are normal. There is no distension.     Palpations: Abdomen is soft. There is no mass.     Tenderness: There is no abdominal tenderness.  Musculoskeletal:        General: No tenderness.     Cervical back: Normal range of motion and neck supple.     Right lower leg: No edema.     Left lower leg: No edema.  Lymphadenopathy:     Cervical: No cervical adenopathy.     Right cervical: No superficial, deep or posterior cervical adenopathy.    Left cervical: No superficial, deep or posterior cervical adenopathy.     Upper Body:     Right upper body: No supraclavicular or axillary adenopathy.     Left upper body: No supraclavicular or axillary adenopathy.     Lower Body: No right inguinal adenopathy. No left inguinal adenopathy.  Skin:    Coloration: Skin is not jaundiced.     Findings: No lesion or rash.  Neurological:     General: No focal deficit present.     Mental Status: She is alert and oriented to person, place, and time. Mental status is at baseline.  Psychiatric:        Mood and Affect: Mood normal.        Behavior: Behavior normal.        Thought Content: Thought content normal.        Judgment: Judgment normal.  LABS:   B12 / folate pending  ASSESSMENT & PLAN:  Assessment/Plan:  A 77 y.o. female with stage IIIA classical Hodgkin lymphoma, nodular sclerosis subtype.  Based upon her labs and physical exam today, she appears to remain disease-free with respect to her Hodgkin lymphoma.  When evaluating her peripheral counts, although they are still low, they are better than what they were previously.  She knows to continue taking her B12 and folate as previously prescribed.  With respect to her left breast pain, I did give the patient  the option of scheduling a mammogram in the forthcoming weeks.  However, she declined wanting this done.  She knows to contact our office over these next few weeks/months if her left breast pain persists to where it needs to be reevaluated radiographically.  With respect to her left ear discomfort, I will refer her to an audiologist to see if anything can be done to address the dizziness and ringing in her left ear.  Otherwise, I will see this patient back in 4 months for repeat clinical assessment.  The patient understands all the plans discussed today and is in agreement with them.    Aniello Christopoulos Macarthur Critchley, MD

## 2023-02-08 ENCOUNTER — Other Ambulatory Visit: Payer: Self-pay | Admitting: Oncology

## 2023-02-08 ENCOUNTER — Inpatient Hospital Stay: Payer: Medicare HMO | Attending: Oncology | Admitting: Oncology

## 2023-02-08 ENCOUNTER — Inpatient Hospital Stay: Payer: Medicare HMO

## 2023-02-08 VITALS — BP 190/86 | HR 81 | Temp 97.9°F | Resp 18 | Ht 62.5 in | Wt 127.6 lb

## 2023-02-08 DIAGNOSIS — E538 Deficiency of other specified B group vitamins: Secondary | ICD-10-CM

## 2023-02-08 DIAGNOSIS — C8118 Nodular sclerosis classical Hodgkin lymphoma, lymph nodes of multiple sites: Secondary | ICD-10-CM

## 2023-02-08 DIAGNOSIS — D649 Anemia, unspecified: Secondary | ICD-10-CM | POA: Diagnosis not present

## 2023-02-08 LAB — CMP (CANCER CENTER ONLY)
ALT: 16 U/L (ref 0–44)
AST: 21 U/L (ref 15–41)
Albumin: 4.5 g/dL (ref 3.5–5.0)
Alkaline Phosphatase: 81 U/L (ref 38–126)
Anion gap: 8 (ref 5–15)
BUN: 20 mg/dL (ref 8–23)
CO2: 27 mmol/L (ref 22–32)
Calcium: 9.1 mg/dL (ref 8.9–10.3)
Chloride: 101 mmol/L (ref 98–111)
Creatinine: 0.88 mg/dL (ref 0.44–1.00)
GFR, Estimated: >60 mL/min (ref >60)
Glucose, Bld: 118 mg/dL — ABNORMAL HIGH (ref 70–99)
Potassium: 4.1 mmol/L (ref 3.5–5.1)
Sodium: 136 mmol/L (ref 135–145)
Total Bilirubin: 0.6 mg/dL (ref 0.3–1.2)
Total Protein: 6.7 g/dL (ref 6.5–8.1)

## 2023-02-08 LAB — CBC AND DIFFERENTIAL
HCT: 31 — AB (ref 36–46)
Hemoglobin: 10.9 — AB (ref 12.0–16.0)
Neutrophils Absolute: 1.48
Platelets: 106 10*3/uL — AB (ref 150–400)
WBC: 2.6

## 2023-02-08 LAB — FOLATE: Folate: 9.9 ng/mL (ref >5.9)

## 2023-02-08 LAB — SEDIMENTATION RATE: Sed Rate: 10 mm/hr (ref 0–22)

## 2023-02-08 LAB — VITAMIN B12: Vitamin B-12: 485 pg/mL (ref 180–914)

## 2023-02-08 LAB — LACTATE DEHYDROGENASE: LDH: 152 U/L (ref 98–192)

## 2023-02-08 LAB — CBC: RBC: 3.1 — AB (ref 3.87–5.11)

## 2023-02-08 MED ORDER — HEPARIN SOD (PORK) LOCK FLUSH 100 UNIT/ML IV SOLN
500.0000 [IU] | Freq: Once | INTRAVENOUS | Status: AC | PRN
  Administered 2023-02-08: 500 [IU]

## 2023-02-08 MED ORDER — SODIUM CHLORIDE 0.9% FLUSH
10.0000 mL | INTRAVENOUS | Status: DC | PRN
  Administered 2023-02-08: 10 mL

## 2023-02-09 ENCOUNTER — Encounter: Payer: Self-pay | Admitting: Oncology

## 2023-02-14 ENCOUNTER — Inpatient Hospital Stay: Payer: Medicare HMO

## 2023-02-14 VITALS — BP 155/53 | HR 87 | Temp 98.1°F | Resp 16 | Ht 62.5 in | Wt 129.2 lb

## 2023-02-14 DIAGNOSIS — E538 Deficiency of other specified B group vitamins: Secondary | ICD-10-CM

## 2023-02-14 MED ORDER — CYANOCOBALAMIN 1000 MCG/ML IJ SOLN
1000.0000 ug | Freq: Once | INTRAMUSCULAR | Status: AC
  Administered 2023-02-14: 1000 ug via INTRAMUSCULAR
  Filled 2023-02-14: qty 1

## 2023-02-14 NOTE — Patient Instructions (Signed)
Vitamin B12 Deficiency Vitamin B12 deficiency means that your body does not have enough vitamin B12. The body needs this important vitamin: To make red blood cells. To make genes (DNA). To help the nerves work. If you do not have enough vitamin B12 in your body, you can have health problems, such as not having enough red blood cells in the blood (anemia). What are the causes? Not eating enough foods that contain vitamin B12. Not being able to take in (absorb) vitamin B12 from the food that you eat. Certain diseases. A condition in which the body does not make enough of a certain protein. This results in your body not taking in enough vitamin B12. Having a surgery in which part of the stomach or small intestine is taken out. Taking medicines that make it hard for the body to take in vitamin B12. These include: Heartburn medicines. Some medicines that are used to treat diabetes. What increases the risk? Being an older adult. Eating a vegetarian or vegan diet that does not include any foods that come from animals. Not eating enough foods that contain vitamin B12 while you are pregnant. Taking certain medicines. Having alcoholism. What are the signs or symptoms? In some cases, there are no symptoms. If the condition leads to too few blood cells or nerve damage, symptoms can occur, such as: Feeling weak or tired. Not being hungry. Losing feeling (numbness) or tingling in your hands and feet. Redness and burning of the tongue. Feeling sad (depressed). Confusion or memory problems. Trouble walking. If anemia is very bad, symptoms can include: Being short of breath. Being dizzy. Having a very fast heartbeat. How is this treated? Changing the way you eat and drink, such as: Eating more foods that contain vitamin B12. Drinking little or no alcohol. Getting vitamin B12 shots. Taking vitamin B12 supplements by mouth (orally). Your doctor will tell you the dose that is best for you. Follow  these instructions at home: Eating and drinking  Eat foods that come from animals and have a lot of vitamin B12 in them. These include: Meats and poultry. This includes beef, pork, chicken, turkey, and organ meats, such as liver. Seafood, such as clams, rainbow trout, salmon, tuna, and haddock. Eggs. Dairy foods such as milk, yogurt, and cheese. Eat breakfast cereals that have vitamin B12 added to them (are fortified). Check the label. The items listed above may not be a complete list of foods and beverages you can eat and drink. Contact a dietitian for more information. Alcohol use Do not drink alcohol if: Your doctor tells you not to drink. You are pregnant, may be pregnant, or are planning to become pregnant. If you drink alcohol: Limit how much you have to: 0-1 drink a day for women. 0-2 drinks a day for men. Know how much alcohol is in your drink. In the [place] one drink equals one 12 oz bottle of beer (355 mL), one 5 oz glass of wine (148 mL), or one 1 oz glass of hard liquor (44 mL). General instructions Get any vitamin B12 shots if told by your doctor. Take supplements only as told by your doctor. Follow the directions. Keep all follow-up visits. Contact a doctor if: Your symptoms come back. Your symptoms get worse or do not get better with treatment. Get help right away if: You have trouble breathing. You have a very fast heartbeat. You have chest pain. You get dizzy. You faint. These symptoms may be an emergency. Get help right away. Call 911.   Do not wait to see if the symptoms will go away. Do not drive yourself to the hospital. Summary Vitamin B12 deficiency means that your body is not getting enough of the vitamin. In some cases, there are no symptoms of this condition. Treatment may include making a change in the way you eat and drink, getting shots, or taking supplements. Eat foods that have vitamin B12 in them. This information is not intended to replace advice  given to you by your health care provider. Make sure you discuss any questions you have with your health care provider. Document Revised: 07/07/2021 Document Reviewed: 07/07/2021 Elsevier Patient Education  2023 [employer] Inc[employer]  

## 2023-02-18 DIAGNOSIS — H04123 Dry eye syndrome of bilateral lacrimal glands: Secondary | ICD-10-CM | POA: Diagnosis not present

## 2023-02-18 DIAGNOSIS — J4540 Moderate persistent asthma, uncomplicated: Secondary | ICD-10-CM | POA: Diagnosis not present

## 2023-02-18 DIAGNOSIS — M47816 Spondylosis without myelopathy or radiculopathy, lumbar region: Secondary | ICD-10-CM | POA: Diagnosis not present

## 2023-02-18 DIAGNOSIS — E559 Vitamin D deficiency, unspecified: Secondary | ICD-10-CM | POA: Diagnosis not present

## 2023-02-18 DIAGNOSIS — M722 Plantar fascial fibromatosis: Secondary | ICD-10-CM | POA: Diagnosis not present

## 2023-02-18 DIAGNOSIS — K219 Gastro-esophageal reflux disease without esophagitis: Secondary | ICD-10-CM | POA: Diagnosis not present

## 2023-02-18 DIAGNOSIS — E119 Type 2 diabetes mellitus without complications: Secondary | ICD-10-CM | POA: Diagnosis not present

## 2023-02-18 DIAGNOSIS — M8588 Other specified disorders of bone density and structure, other site: Secondary | ICD-10-CM | POA: Diagnosis not present

## 2023-02-18 DIAGNOSIS — M545 Low back pain, unspecified: Secondary | ICD-10-CM | POA: Diagnosis not present

## 2023-02-18 DIAGNOSIS — M19041 Primary osteoarthritis, right hand: Secondary | ICD-10-CM | POA: Diagnosis not present

## 2023-02-18 DIAGNOSIS — M359 Systemic involvement of connective tissue, unspecified: Secondary | ICD-10-CM | POA: Diagnosis not present

## 2023-02-18 DIAGNOSIS — Z8719 Personal history of other diseases of the digestive system: Secondary | ICD-10-CM | POA: Diagnosis not present

## 2023-02-19 DIAGNOSIS — N1831 Chronic kidney disease, stage 3a: Secondary | ICD-10-CM | POA: Diagnosis not present

## 2023-02-19 DIAGNOSIS — E1129 Type 2 diabetes mellitus with other diabetic kidney complication: Secondary | ICD-10-CM | POA: Diagnosis not present

## 2023-02-19 DIAGNOSIS — D709 Neutropenia, unspecified: Secondary | ICD-10-CM | POA: Diagnosis not present

## 2023-02-19 DIAGNOSIS — R809 Proteinuria, unspecified: Secondary | ICD-10-CM | POA: Diagnosis not present

## 2023-02-19 DIAGNOSIS — Z0000 Encounter for general adult medical examination without abnormal findings: Secondary | ICD-10-CM | POA: Diagnosis not present

## 2023-02-19 DIAGNOSIS — M0579 Rheumatoid arthritis with rheumatoid factor of multiple sites without organ or systems involvement: Secondary | ICD-10-CM | POA: Diagnosis not present

## 2023-02-19 DIAGNOSIS — C8114 Nodular sclerosis classical Hodgkin lymphoma, lymph nodes of axilla and upper limb: Secondary | ICD-10-CM | POA: Diagnosis not present

## 2023-02-19 DIAGNOSIS — Z1331 Encounter for screening for depression: Secondary | ICD-10-CM | POA: Diagnosis not present

## 2023-02-19 DIAGNOSIS — Z6822 Body mass index (BMI) 22.0-22.9, adult: Secondary | ICD-10-CM | POA: Diagnosis not present

## 2023-02-19 DIAGNOSIS — E785 Hyperlipidemia, unspecified: Secondary | ICD-10-CM | POA: Diagnosis not present

## 2023-03-07 DIAGNOSIS — Z6822 Body mass index (BMI) 22.0-22.9, adult: Secondary | ICD-10-CM | POA: Diagnosis not present

## 2023-03-07 DIAGNOSIS — J329 Chronic sinusitis, unspecified: Secondary | ICD-10-CM | POA: Diagnosis not present

## 2023-03-07 DIAGNOSIS — J40 Bronchitis, not specified as acute or chronic: Secondary | ICD-10-CM | POA: Diagnosis not present

## 2023-03-15 ENCOUNTER — Encounter: Payer: Self-pay | Admitting: Oncology

## 2023-03-18 ENCOUNTER — Inpatient Hospital Stay: Payer: Medicare HMO | Attending: Oncology

## 2023-03-18 VITALS — BP 136/65 | HR 88 | Temp 98.1°F | Resp 16 | Ht 62.5 in | Wt 127.0 lb

## 2023-03-18 DIAGNOSIS — E538 Deficiency of other specified B group vitamins: Secondary | ICD-10-CM | POA: Diagnosis not present

## 2023-03-18 MED ORDER — CYANOCOBALAMIN 1000 MCG/ML IJ SOLN
1000.0000 ug | Freq: Once | INTRAMUSCULAR | Status: AC
  Administered 2023-03-18: 1000 ug via INTRAMUSCULAR
  Filled 2023-03-18: qty 1

## 2023-03-18 NOTE — Patient Instructions (Signed)
Vitamin B12 Deficiency Vitamin B12 deficiency means that your body does not have enough vitamin B12. The body needs this important vitamin: To make red blood cells. To make genes (DNA). To help the nerves work. If you do not have enough vitamin B12 in your body, you can have health problems, such as not having enough red blood cells in the blood (anemia). What are the causes? Not eating enough foods that contain vitamin B12. Not being able to take in (absorb) vitamin B12 from the food that you eat. Certain diseases. A condition in which the body does not make enough of a certain protein. This results in your body not taking in enough vitamin B12. Having a surgery in which part of the stomach or small intestine is taken out. Taking medicines that make it hard for the body to take in vitamin B12. These include: Heartburn medicines. Some medicines that are used to treat diabetes. What increases the risk? Being an older adult. Eating a vegetarian or vegan diet that does not include any foods that come from animals. Not eating enough foods that contain vitamin B12 while you are pregnant. Taking certain medicines. Having alcoholism. What are the signs or symptoms? In some cases, there are no symptoms. If the condition leads to too few blood cells or nerve damage, symptoms can occur, such as: Feeling weak or tired. Not being hungry. Losing feeling (numbness) or tingling in your hands and feet. Redness and burning of the tongue. Feeling sad (depressed). Confusion or memory problems. Trouble walking. If anemia is very bad, symptoms can include: Being short of breath. Being dizzy. Having a very fast heartbeat. How is this treated? Changing the way you eat and drink, such as: Eating more foods that contain vitamin B12. Drinking little or no alcohol. Getting vitamin B12 shots. Taking vitamin B12 supplements by mouth (orally). Your doctor will tell you the dose that is best for you. Follow  these instructions at home: Eating and drinking  Eat foods that come from animals and have a lot of vitamin B12 in them. These include: Meats and poultry. This includes beef, pork, chicken, turkey, and organ meats, such as liver. Seafood, such as clams, rainbow trout, salmon, tuna, and haddock. Eggs. Dairy foods such as milk, yogurt, and cheese. Eat breakfast cereals that have vitamin B12 added to them (are fortified). Check the label. The items listed above may not be a complete list of foods and beverages you can eat and drink. Contact a dietitian for more information. Alcohol use Do not drink alcohol if: Your doctor tells you not to drink. You are pregnant, may be pregnant, or are planning to become pregnant. If you drink alcohol: Limit how much you have to: 0-1 drink a day for women. 0-2 drinks a day for men. Know how much alcohol is in your drink. In the [place] one drink equals one 12 oz bottle of beer (355 mL), one 5 oz glass of wine (148 mL), or one 1 oz glass of hard liquor (44 mL). General instructions Get any vitamin B12 shots if told by your doctor. Take supplements only as told by your doctor. Follow the directions. Keep all follow-up visits. Contact a doctor if: Your symptoms come back. Your symptoms get worse or do not get better with treatment. Get help right away if: You have trouble breathing. You have a very fast heartbeat. You have chest pain. You get dizzy. You faint. These symptoms may be an emergency. Get help right away. Call 911.   Do not wait to see if the symptoms will go away. Do not drive yourself to the hospital. Summary Vitamin B12 deficiency means that your body is not getting enough of the vitamin. In some cases, there are no symptoms of this condition. Treatment may include making a change in the way you eat and drink, getting shots, or taking supplements. Eat foods that have vitamin B12 in them. This information is not intended to replace advice  given to you by your health care provider. Make sure you discuss any questions you have with your health care provider. Document Revised: 07/07/2021 Document Reviewed: 07/07/2021 Elsevier Patient Education  2023 [employer] Inc[employer]  

## 2023-03-26 DIAGNOSIS — N1831 Chronic kidney disease, stage 3a: Secondary | ICD-10-CM | POA: Diagnosis not present

## 2023-03-26 DIAGNOSIS — E785 Hyperlipidemia, unspecified: Secondary | ICD-10-CM | POA: Diagnosis not present

## 2023-03-26 DIAGNOSIS — I10 Essential (primary) hypertension: Secondary | ICD-10-CM | POA: Diagnosis not present

## 2023-04-17 ENCOUNTER — Inpatient Hospital Stay: Payer: Medicare HMO | Attending: Oncology

## 2023-04-17 VITALS — BP 156/76 | HR 91 | Temp 97.9°F | Resp 16 | Ht 62.5 in | Wt 131.0 lb

## 2023-04-17 DIAGNOSIS — E538 Deficiency of other specified B group vitamins: Secondary | ICD-10-CM | POA: Diagnosis not present

## 2023-04-17 MED ORDER — CYANOCOBALAMIN 1000 MCG/ML IJ SOLN
1000.0000 ug | Freq: Once | INTRAMUSCULAR | Status: AC
  Administered 2023-04-17: 1000 ug via INTRAMUSCULAR
  Filled 2023-04-17: qty 1

## 2023-05-07 DIAGNOSIS — Z0100 Encounter for examination of eyes and vision without abnormal findings: Secondary | ICD-10-CM | POA: Diagnosis not present

## 2023-05-07 DIAGNOSIS — E119 Type 2 diabetes mellitus without complications: Secondary | ICD-10-CM | POA: Diagnosis not present

## 2023-05-07 DIAGNOSIS — M0609 Rheumatoid arthritis without rheumatoid factor, multiple sites: Secondary | ICD-10-CM | POA: Diagnosis not present

## 2023-05-07 DIAGNOSIS — Z79899 Other long term (current) drug therapy: Secondary | ICD-10-CM | POA: Diagnosis not present

## 2023-05-13 ENCOUNTER — Encounter: Payer: Self-pay | Admitting: Oncology

## 2023-05-20 ENCOUNTER — Inpatient Hospital Stay: Payer: Medicare HMO | Attending: Oncology

## 2023-05-20 ENCOUNTER — Inpatient Hospital Stay: Payer: Medicare HMO

## 2023-05-20 VITALS — BP 152/69 | HR 84 | Temp 98.2°F | Resp 14 | Ht 62.5 in | Wt 132.0 lb

## 2023-05-20 DIAGNOSIS — E538 Deficiency of other specified B group vitamins: Secondary | ICD-10-CM | POA: Diagnosis not present

## 2023-05-20 MED ORDER — SODIUM CHLORIDE 0.9% FLUSH
10.0000 mL | INTRAVENOUS | Status: DC | PRN
  Administered 2023-05-20: 10 mL

## 2023-05-20 MED ORDER — HEPARIN SOD (PORK) LOCK FLUSH 100 UNIT/ML IV SOLN
500.0000 [IU] | Freq: Once | INTRAVENOUS | Status: AC | PRN
  Administered 2023-05-20: 500 [IU]

## 2023-05-20 MED ORDER — CYANOCOBALAMIN 1000 MCG/ML IJ SOLN
1000.0000 ug | Freq: Once | INTRAMUSCULAR | Status: AC
  Administered 2023-05-20: 1000 ug via INTRAMUSCULAR
  Filled 2023-05-20: qty 1

## 2023-05-22 DIAGNOSIS — Z79899 Other long term (current) drug therapy: Secondary | ICD-10-CM | POA: Diagnosis not present

## 2023-05-22 DIAGNOSIS — C8114 Nodular sclerosis classical Hodgkin lymphoma, lymph nodes of axilla and upper limb: Secondary | ICD-10-CM | POA: Diagnosis not present

## 2023-05-22 DIAGNOSIS — Z6822 Body mass index (BMI) 22.0-22.9, adult: Secondary | ICD-10-CM | POA: Diagnosis not present

## 2023-05-22 DIAGNOSIS — B354 Tinea corporis: Secondary | ICD-10-CM | POA: Diagnosis not present

## 2023-05-22 DIAGNOSIS — Z1331 Encounter for screening for depression: Secondary | ICD-10-CM | POA: Diagnosis not present

## 2023-05-22 DIAGNOSIS — E1129 Type 2 diabetes mellitus with other diabetic kidney complication: Secondary | ICD-10-CM | POA: Diagnosis not present

## 2023-05-22 DIAGNOSIS — N6332 Unspecified lump in axillary tail of the left breast: Secondary | ICD-10-CM | POA: Diagnosis not present

## 2023-05-22 DIAGNOSIS — R809 Proteinuria, unspecified: Secondary | ICD-10-CM | POA: Diagnosis not present

## 2023-05-24 ENCOUNTER — Telehealth: Payer: Self-pay

## 2023-05-24 NOTE — Patient Outreach (Signed)
  Care Coordination   05/24/2023 Name: Melanie Miles MRN: [id number] DOB: March 19, 1946   Care Coordination Outreach Attempts:  An unsuccessful telephone outreach was attempted today to offer the patient information about available care coordination services.  Follow Up Plan:  Additional outreach attempts will be made to offer the patient care coordination information and services.   Encounter Outcome:  No Answer   Care Coordination Interventions:  No, not indicated    Rowe Pavy, RN, BSN, [facility] [employer] [phone]

## 2023-05-27 DIAGNOSIS — N6332 Unspecified lump in axillary tail of the left breast: Secondary | ICD-10-CM | POA: Diagnosis not present

## 2023-05-27 DIAGNOSIS — N6320 Unspecified lump in the left breast, unspecified quadrant: Secondary | ICD-10-CM | POA: Diagnosis not present

## 2023-05-27 DIAGNOSIS — R92333 Mammographic heterogeneous density, bilateral breasts: Secondary | ICD-10-CM | POA: Diagnosis not present

## 2023-06-13 NOTE — Progress Notes (Signed)
[facility]  [address] [place],  [place]  [address] [phone]  Clinic Day: 06/14/2023  Referring physician: Weston Settle, MD  HISTORY OF PRESENT ILLNESS:  The patient is a 77 y.o. female with stage IIIA classical Hodgkin lymphoma.  She completed 2 cycles of brentuximab vedotin before completing 5 of 6 planned cycles of Adriamycin/vinblastine/dacarbazine (AVD) chemotherapy in September 2023.  Due to her extreme fatigue and weakness, she did not receive her sixth and final cycle of AVD chemotherapy.  Her PET scan done after her therapy showed her to have had a complete response.  She comes in today for routine follow-up.  Since her last visit, the patient has been doing much better.  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  As it pertains to her Hodgkin lymphoma, she denies having any B symptoms or lymphadenopathy which concerns her for early disease recurrence.    PHYSICAL EXAM:  Blood pressure (!) 185/77, pulse 84, temperature 98 F (36.7 C), resp. rate 14, height 5' 2.5" (1.588 m), weight 131 lb 6.4 oz (59.6 kg), SpO2 97%. Wt Readings from Last 3 Encounters:  06/14/23 131 lb 6.4 oz (59.6 kg)  05/20/23 132 lb (59.9 kg)  04/17/23 131 lb 0.6 oz (59.4 kg)   Body mass index is 23.65 kg/m. Performance status (ECOG): 1 - Symptomatic but completely ambulatory Physical Exam Constitutional:      Appearance: Normal appearance.     Comments: She looks stronger versus previous visits.  HENT:     Mouth/Throat:     Pharynx: Oropharynx is clear. No oropharyngeal exudate.  Cardiovascular:     Rate and Rhythm: Normal rate and regular rhythm.     Heart sounds: No murmur heard.    No friction rub. No gallop.  Pulmonary:     Breath sounds: Normal breath sounds.  Chest:  Breasts:    Right: No swelling, bleeding, inverted nipple, mass, nipple discharge or skin change.     Left: No swelling,  bleeding, inverted nipple, mass, nipple discharge or skin change.  Abdominal:     General: Bowel sounds are normal. There is no distension.     Palpations: Abdomen is soft. There is no mass.     Tenderness: There is no abdominal tenderness.  Musculoskeletal:        General: No tenderness.     Cervical back: Normal range of motion and neck supple.     Right lower leg: No edema.     Left lower leg: No edema.  Lymphadenopathy:     Cervical: No cervical adenopathy.     Right cervical: No superficial, deep or posterior cervical adenopathy.    Left cervical: No superficial, deep or posterior cervical adenopathy.     Upper Body:     Right upper body: No supraclavicular or axillary adenopathy.     Left upper body: No supraclavicular or axillary adenopathy.     Lower Body: No right inguinal adenopathy. No left inguinal adenopathy.  Skin:    Coloration: Skin is not jaundiced.     Findings: No lesion or rash.  Neurological:     General: No focal deficit present.     Mental Status: She is alert and oriented to person, place, and time. Mental status is at baseline.  Psychiatric:        Mood and Affect: Mood normal.        Behavior: Behavior normal.  Thought Content: Thought content normal.        Judgment: Judgment normal.    LABS:  Latest Reference Range & Units 06/14/23 00:00  WBC  2.9 (E)  RBC 3.87 - 5.11  3.27 ! (E)  Hemoglobin 12.0 - 16.0  11.1 ! (E)  HCT 36 - 46  33 ! (E)  Platelets 150 - 400 K/uL 112 ! (E)  NEUT#  1.54 (E)  !: Data is abnormal (E): External lab result  Latest Reference Range & Units 06/14/23 11:21 06/14/23 13:39  Sodium 135 - 145 mmol/L 137   Potassium 3.5 - 5.1 mmol/L 4.9   Chloride 98 - 111 mmol/L 104   CO2 22 - 32 mmol/L 27   Glucose 70 - 99 mg/dL 147 (H)   BUN 8 - 23 mg/dL 19   Creatinine 8.29 - 1.00 mg/dL 5.62   Calcium 8.9 - 13.0 mg/dL 9.2   Anion gap 5 - 15  6   Alkaline Phosphatase 38 - 126 U/L 91   Albumin 3.5 - 5.0 g/dL 4.1   AST 15 - 41  U/L 19   ALT 0 - 44 U/L 15   Total Protein 6.5 - 8.1 g/dL 6.4 (L)   Total Bilirubin 0.3 - 1.2 mg/dL 0.6   GFR, Est Non African American >60 mL/min >60   LDH 98 - 192 U/L  148  (H): Data is abnormally high (L): Data is abnormally low  ASSESSMENT & PLAN:  Assessment/Plan:  A 77 y.o. female with stage IIIA classical Hodgkin lymphoma, nodular sclerosis subtype.  Based upon her labs and physical exam today, she appears to remain disease-free.  When evaluating her peripheral counts, although they are still low, they are better than what they were previously.  She knows to continue taking her B12 and folate as previously prescribed.  Overall, she continues to do very well.  I will see this patient back in 4 months for repeat clinical assessment.  The patient understands all the plans discussed today and is in agreement with them.    Onna Nodal Kirby Funk, MD

## 2023-06-14 ENCOUNTER — Other Ambulatory Visit: Payer: Self-pay | Admitting: Oncology

## 2023-06-14 ENCOUNTER — Inpatient Hospital Stay: Payer: Medicare HMO | Attending: Oncology | Admitting: Oncology

## 2023-06-14 ENCOUNTER — Inpatient Hospital Stay: Payer: Medicare HMO

## 2023-06-14 VITALS — BP 185/77 | HR 84 | Temp 98.0°F | Resp 14 | Ht 62.5 in | Wt 131.4 lb

## 2023-06-14 DIAGNOSIS — C8118 Nodular sclerosis classical Hodgkin lymphoma, lymph nodes of multiple sites: Secondary | ICD-10-CM | POA: Diagnosis not present

## 2023-06-14 DIAGNOSIS — D649 Anemia, unspecified: Secondary | ICD-10-CM | POA: Diagnosis not present

## 2023-06-14 DIAGNOSIS — R531 Weakness: Secondary | ICD-10-CM | POA: Diagnosis not present

## 2023-06-14 DIAGNOSIS — Z8572 Personal history of non-Hodgkin lymphomas: Secondary | ICD-10-CM | POA: Diagnosis not present

## 2023-06-14 DIAGNOSIS — R5383 Other fatigue: Secondary | ICD-10-CM | POA: Diagnosis not present

## 2023-06-14 DIAGNOSIS — C8110 Nodular sclerosis classical Hodgkin lymphoma, unspecified site: Secondary | ICD-10-CM

## 2023-06-14 DIAGNOSIS — E538 Deficiency of other specified B group vitamins: Secondary | ICD-10-CM | POA: Diagnosis not present

## 2023-06-14 DIAGNOSIS — C8121 Mixed cellularity classical Hodgkin lymphoma, lymph nodes of head, face, and neck: Secondary | ICD-10-CM | POA: Diagnosis not present

## 2023-06-14 LAB — CMP (CANCER CENTER ONLY)
ALT: 15 U/L (ref 0–44)
AST: 19 U/L (ref 15–41)
Albumin: 4.1 g/dL (ref 3.5–5.0)
Alkaline Phosphatase: 91 U/L (ref 38–126)
Anion gap: 6 (ref 5–15)
BUN: 19 mg/dL (ref 8–23)
CO2: 27 mmol/L (ref 22–32)
Calcium: 9.2 mg/dL (ref 8.9–10.3)
Chloride: 104 mmol/L (ref 98–111)
Creatinine: 0.92 mg/dL (ref 0.44–1.00)
GFR, Estimated: >60 mL/min (ref >60)
Glucose, Bld: 119 mg/dL — ABNORMAL HIGH (ref 70–99)
Potassium: 4.9 mmol/L (ref 3.5–5.1)
Sodium: 137 mmol/L (ref 135–145)
Total Bilirubin: 0.6 mg/dL (ref 0.3–1.2)
Total Protein: 6.4 g/dL — ABNORMAL LOW (ref 6.5–8.1)

## 2023-06-14 LAB — CBC AND DIFFERENTIAL
HCT: 33 — AB (ref 36–46)
Hemoglobin: 11.1 — AB (ref 12.0–16.0)
Neutrophils Absolute: 1.54
Platelets: 112 10*3/uL — AB (ref 150–400)
WBC: 2.9

## 2023-06-14 LAB — CBC W DIFFERENTIAL (~~LOC~~ CC SCANNED REPORT)

## 2023-06-14 LAB — IRON AND TIBC
Iron: 75 ug/dL (ref 28–170)
Saturation Ratios: 27 % (ref 10.4–31.8)
TIBC: 279 ug/dL (ref 250–450)
UIBC: 204 ug/dL

## 2023-06-14 LAB — LACTATE DEHYDROGENASE: LDH: 148 U/L (ref 98–192)

## 2023-06-14 LAB — FERRITIN: Ferritin: 381 ng/mL — ABNORMAL HIGH (ref 11–307)

## 2023-06-14 LAB — CBC: RBC: 3.27 — AB (ref 3.87–5.11)

## 2023-06-14 LAB — FOLATE: Folate: 9.8 ng/mL (ref >5.9)

## 2023-06-14 LAB — VITAMIN B12: Vitamin B-12: 568 pg/mL (ref 180–914)

## 2023-06-17 ENCOUNTER — Inpatient Hospital Stay: Payer: Medicare HMO

## 2023-06-17 VITALS — BP 166/77 | HR 81 | Temp 97.7°F | Resp 16 | Wt 133.0 lb

## 2023-06-17 DIAGNOSIS — Z8572 Personal history of non-Hodgkin lymphomas: Secondary | ICD-10-CM | POA: Diagnosis not present

## 2023-06-17 DIAGNOSIS — R531 Weakness: Secondary | ICD-10-CM | POA: Diagnosis not present

## 2023-06-17 DIAGNOSIS — R5383 Other fatigue: Secondary | ICD-10-CM | POA: Diagnosis not present

## 2023-06-17 DIAGNOSIS — E538 Deficiency of other specified B group vitamins: Secondary | ICD-10-CM

## 2023-06-17 MED ORDER — CYANOCOBALAMIN 1000 MCG/ML IJ SOLN
1000.0000 ug | Freq: Once | INTRAMUSCULAR | Status: AC
  Administered 2023-06-17: 1000 ug via INTRAMUSCULAR
  Filled 2023-06-17: qty 1

## 2023-06-17 NOTE — Patient Instructions (Signed)
Vitamin B12 Injection What is this medication? Vitamin B12 (VAHY tuh min B12) prevents and treats low vitamin B12 levels in your body. It is used in people who do not get enough vitamin B12 from their diet or when their digestive tract does not absorb enough. Vitamin B12 plays an important role in maintaining the health of your nervous system and red blood cells. This medicine may be used for other purposes; ask your health care provider or pharmacist if you have questions. COMMON BRAND NAME(S): B-12 Compliance Kit, B-12 Injection Kit, Cyomin, Dodex, LA-12, Nutri-Twelve, Physicians EZ Use B-12, Primabalt, Vitamin Deficiency Injectable System - B12 What should I tell my care team before I take this medication? They need to know if you have any of these conditions: Kidney disease Leber's disease Megaloblastic anemia An unusual or allergic reaction to cyanocobalamin, cobalt, other medications, foods, dyes, or preservatives Pregnant or trying to get pregnant Breast-feeding How should I use this medication? This medication is injected into a muscle or deeply under the skin. It is usually given in a clinic or care team's office. However, your care team may teach you how to inject yourself. Follow all instructions. Talk to your care team about the use of this medication in children. Special care may be needed. Overdosage: If you think you have taken too much of this medicine contact a poison control center or emergency room at once. NOTE: This medicine is only for you. Do not share this medicine with others. What if I miss a dose? If you are given your dose at a clinic or care team's office, call to reschedule your appointment. If you give your own injections, and you miss a dose, take it as soon as you can. If it is almost time for your next dose, take only that dose. Do not take double or extra doses. What may interact with this medication? Alcohol Colchicine This list may not describe all possible  interactions. Give your health care provider a list of all the medicines, herbs, non-prescription drugs, or dietary supplements you use. Also tell them if you smoke, drink alcohol, or use illegal drugs. Some items may interact with your medicine. What should I watch for while using this medication? Visit your care team regularly. You may need blood work done while you are taking this medication. You may need to follow a special diet. Talk to your care team. Limit your alcohol intake and avoid smoking to get the best benefit. What side effects may I notice from receiving this medication? Side effects that you should report to your care team as soon as possible: Allergic reactions--skin rash, itching, hives, swelling of the face, lips, tongue, or throat Swelling of the ankles, hands, or feet Trouble breathing Side effects that usually do not require medical attention (report to your care team if they continue or are bothersome): Diarrhea This list may not describe all possible side effects. Call your doctor for medical advice about side effects. You may report side effects to FDA at 1-800-FDA-1088. Where should I keep my medication? Keep out of the reach of children. Store at room temperature between 15 and 30 degrees C (59 and 85 degrees F). Protect from light. Throw away any unused medication after the expiration date. NOTE: This sheet is a summary. It may not cover all possible information. If you have questions about this medicine, talk to your doctor, pharmacist, or health care provider.  2024 Elsevier/Gold Standard (2021-07-25 00:00:00)

## 2023-06-21 DIAGNOSIS — M609 Myositis, unspecified: Secondary | ICD-10-CM | POA: Diagnosis not present

## 2023-06-21 DIAGNOSIS — Z6822 Body mass index (BMI) 22.0-22.9, adult: Secondary | ICD-10-CM | POA: Diagnosis not present

## 2023-06-21 DIAGNOSIS — M0579 Rheumatoid arthritis with rheumatoid factor of multiple sites without organ or systems involvement: Secondary | ICD-10-CM | POA: Diagnosis not present

## 2023-06-21 DIAGNOSIS — C8114 Nodular sclerosis classical Hodgkin lymphoma, lymph nodes of axilla and upper limb: Secondary | ICD-10-CM | POA: Diagnosis not present

## 2023-06-21 DIAGNOSIS — R809 Proteinuria, unspecified: Secondary | ICD-10-CM | POA: Diagnosis not present

## 2023-06-21 DIAGNOSIS — E1129 Type 2 diabetes mellitus with other diabetic kidney complication: Secondary | ICD-10-CM | POA: Diagnosis not present

## 2023-07-19 ENCOUNTER — Inpatient Hospital Stay: Payer: Medicare HMO | Attending: Oncology

## 2023-07-19 VITALS — BP 173/76 | HR 82 | Temp 98.0°F | Resp 14 | Ht 62.5 in | Wt 134.0 lb

## 2023-07-19 DIAGNOSIS — E538 Deficiency of other specified B group vitamins: Secondary | ICD-10-CM | POA: Diagnosis not present

## 2023-07-19 MED ORDER — CYANOCOBALAMIN 1000 MCG/ML IJ SOLN
1000.0000 ug | Freq: Once | INTRAMUSCULAR | Status: AC
  Administered 2023-07-19: 1000 ug via INTRAMUSCULAR
  Filled 2023-07-19: qty 1

## 2023-08-13 DIAGNOSIS — Z6823 Body mass index (BMI) 23.0-23.9, adult: Secondary | ICD-10-CM | POA: Diagnosis not present

## 2023-08-13 DIAGNOSIS — H109 Unspecified conjunctivitis: Secondary | ICD-10-CM | POA: Diagnosis not present

## 2023-08-13 DIAGNOSIS — B9689 Other specified bacterial agents as the cause of diseases classified elsewhere: Secondary | ICD-10-CM | POA: Diagnosis not present

## 2023-08-15 DIAGNOSIS — R0981 Nasal congestion: Secondary | ICD-10-CM | POA: Diagnosis not present

## 2023-08-15 DIAGNOSIS — J069 Acute upper respiratory infection, unspecified: Secondary | ICD-10-CM | POA: Diagnosis not present

## 2023-08-18 ENCOUNTER — Encounter: Payer: Self-pay | Admitting: Oncology

## 2023-08-19 ENCOUNTER — Inpatient Hospital Stay: Payer: Medicare HMO

## 2023-08-21 ENCOUNTER — Encounter: Payer: Self-pay | Admitting: Oncology

## 2023-08-22 ENCOUNTER — Inpatient Hospital Stay: Payer: Medicare HMO | Attending: Oncology

## 2023-08-22 VITALS — BP 159/63 | HR 88 | Temp 98.4°F | Resp 18 | Ht 62.5 in | Wt 133.5 lb

## 2023-08-22 DIAGNOSIS — E538 Deficiency of other specified B group vitamins: Secondary | ICD-10-CM | POA: Diagnosis not present

## 2023-08-22 MED ORDER — CYANOCOBALAMIN 1000 MCG/ML IJ SOLN
1000.0000 ug | Freq: Once | INTRAMUSCULAR | Status: AC
  Administered 2023-08-22: 1000 ug via INTRAMUSCULAR
  Filled 2023-08-22: qty 1

## 2023-08-22 NOTE — Patient Instructions (Signed)
 Vitamin B12 Injection What is this medication? Vitamin B12 (VAHY tuh min B12) prevents and treats low vitamin B12 levels in your body. It is used in people who do not get enough vitamin B12 from their diet or when their digestive tract does not absorb enough. Vitamin B12 plays an important role in maintaining the health of your nervous system and red blood cells. This medicine may be used for other purposes; ask your health care provider or pharmacist if you have questions. COMMON BRAND NAME(S): B-12 Compliance Kit, B-12 Injection Kit, Cyomin, Dodex, LA-12, Nutri-Twelve, Physicians EZ Use B-12, Primabalt, Vitamin Deficiency Injectable System - B12 What should I tell my care team before I take this medication? They need to know if you have any of these conditions: Kidney disease Leber's disease Megaloblastic anemia An unusual or allergic reaction to cyanocobalamin, cobalt, other medications, foods, dyes, or preservatives Pregnant or trying to get pregnant Breast-feeding How should I use this medication? This medication is injected into a muscle or deeply under the skin. It is usually given in a clinic or care team's office. However, your care team may teach you how to inject yourself. Follow all instructions. Talk to your care team about the use of this medication in children. Special care may be needed. Overdosage: If you think you have taken too much of this medicine contact a poison control center or emergency room at once. NOTE: This medicine is only for you. Do not share this medicine with others. What if I miss a dose? If you are given your dose at a clinic or care team's office, call to reschedule your appointment. If you give your own injections, and you miss a dose, take it as soon as you can. If it is almost time for your next dose, take only that dose. Do not take double or extra doses. What may interact with this medication? Alcohol Colchicine This list may not describe all possible  interactions. Give your health care provider a list of all the medicines, herbs, non-prescription drugs, or dietary supplements you use. Also tell them if you smoke, drink alcohol, or use illegal drugs. Some items may interact with your medicine. What should I watch for while using this medication? Visit your care team regularly. You may need blood work done while you are taking this medication. You may need to follow a special diet. Talk to your care team. Limit your alcohol intake and avoid smoking to get the best benefit. What side effects may I notice from receiving this medication? Side effects that you should report to your care team as soon as possible: Allergic reactions--skin rash, itching, hives, swelling of the face, lips, tongue, or throat Swelling of the ankles, hands, or feet Trouble breathing Side effects that usually do not require medical attention (report to your care team if they continue or are bothersome): Diarrhea This list may not describe all possible side effects. Call your doctor for medical advice about side effects. You may report side effects to FDA at 1-800-FDA-1088. Where should I keep my medication? Keep out of the reach of children. Store at room temperature between 15 and 30 degrees C (59 and 85 degrees F). Protect from light. Throw away any unused medication after the expiration date. NOTE: This sheet is a summary. It may not cover all possible information. If you have questions about this medicine, talk to your doctor, pharmacist, or health care provider.  2024 Elsevier/Gold Standard (2021-07-25 00:00:00)

## 2023-08-27 DIAGNOSIS — Z433 Encounter for attention to colostomy: Secondary | ICD-10-CM | POA: Diagnosis not present

## 2023-08-27 DIAGNOSIS — G8929 Other chronic pain: Secondary | ICD-10-CM | POA: Diagnosis not present

## 2023-08-27 DIAGNOSIS — Z435 Encounter for attention to cystostomy: Secondary | ICD-10-CM | POA: Diagnosis not present

## 2023-08-27 DIAGNOSIS — Z86718 Personal history of other venous thrombosis and embolism: Secondary | ICD-10-CM | POA: Diagnosis not present

## 2023-08-27 DIAGNOSIS — C52 Malignant neoplasm of vagina: Secondary | ICD-10-CM | POA: Diagnosis not present

## 2023-08-27 DIAGNOSIS — N1330 Unspecified hydronephrosis: Secondary | ICD-10-CM | POA: Diagnosis not present

## 2023-08-27 DIAGNOSIS — D630 Anemia in neoplastic disease: Secondary | ICD-10-CM | POA: Diagnosis not present

## 2023-08-27 DIAGNOSIS — Z466 Encounter for fitting and adjustment of urinary device: Secondary | ICD-10-CM | POA: Diagnosis not present

## 2023-08-27 DIAGNOSIS — Z483 Aftercare following surgery for neoplasm: Secondary | ICD-10-CM | POA: Diagnosis not present

## 2023-08-27 DIAGNOSIS — K5909 Other constipation: Secondary | ICD-10-CM | POA: Diagnosis not present

## 2023-09-19 ENCOUNTER — Inpatient Hospital Stay: Payer: Medicare HMO | Attending: Oncology

## 2023-09-19 VITALS — BP 165/67 | HR 82 | Temp 98.1°F | Resp 18

## 2023-09-19 DIAGNOSIS — E538 Deficiency of other specified B group vitamins: Secondary | ICD-10-CM | POA: Diagnosis not present

## 2023-09-19 MED ORDER — HEPARIN SOD (PORK) LOCK FLUSH 100 UNIT/ML IV SOLN
500.0000 [IU] | Freq: Once | INTRAVENOUS | Status: AC | PRN
  Administered 2023-09-19: 500 [IU]

## 2023-09-19 MED ORDER — CYANOCOBALAMIN 1000 MCG/ML IJ SOLN
1000.0000 ug | Freq: Once | INTRAMUSCULAR | Status: AC
  Administered 2023-09-19: 1000 ug via INTRAMUSCULAR
  Filled 2023-09-19: qty 1

## 2023-09-19 MED ORDER — SODIUM CHLORIDE 0.9% FLUSH
10.0000 mL | INTRAVENOUS | Status: DC | PRN
  Administered 2023-09-19: 10 mL

## 2023-09-19 NOTE — Patient Instructions (Signed)
 Vitamin B12 Injection What is this medication? Vitamin B12 (VAHY tuh min B12) prevents and treats low vitamin B12 levels in your body. It is used in people who do not get enough vitamin B12 from their diet or when their digestive tract does not absorb enough. Vitamin B12 plays an important role in maintaining the health of your nervous system and red blood cells. This medicine may be used for other purposes; ask your health care provider or pharmacist if you have questions. COMMON BRAND NAME(S): B-12 Compliance Kit, B-12 Injection Kit, Cyomin, Dodex, LA-12, Nutri-Twelve, Physicians EZ Use B-12, Primabalt, Vitamin Deficiency Injectable System - B12 What should I tell my care team before I take this medication? They need to know if you have any of these conditions: Kidney disease Leber's disease Megaloblastic anemia An unusual or allergic reaction to cyanocobalamin, cobalt, other medications, foods, dyes, or preservatives Pregnant or trying to get pregnant Breast-feeding How should I use this medication? This medication is injected into a muscle or deeply under the skin. It is usually given in a clinic or care team's office. However, your care team may teach you how to inject yourself. Follow all instructions. Talk to your care team about the use of this medication in children. Special care may be needed. Overdosage: If you think you have taken too much of this medicine contact a poison control center or emergency room at once. NOTE: This medicine is only for you. Do not share this medicine with others. What if I miss a dose? If you are given your dose at a clinic or care team's office, call to reschedule your appointment. If you give your own injections, and you miss a dose, take it as soon as you can. If it is almost time for your next dose, take only that dose. Do not take double or extra doses. What may interact with this medication? Alcohol Colchicine This list may not describe all possible  interactions. Give your health care provider a list of all the medicines, herbs, non-prescription drugs, or dietary supplements you use. Also tell them if you smoke, drink alcohol, or use illegal drugs. Some items may interact with your medicine. What should I watch for while using this medication? Visit your care team regularly. You may need blood work done while you are taking this medication. You may need to follow a special diet. Talk to your care team. Limit your alcohol intake and avoid smoking to get the best benefit. What side effects may I notice from receiving this medication? Side effects that you should report to your care team as soon as possible: Allergic reactions--skin rash, itching, hives, swelling of the face, lips, tongue, or throat Swelling of the ankles, hands, or feet Trouble breathing Side effects that usually do not require medical attention (report to your care team if they continue or are bothersome): Diarrhea This list may not describe all possible side effects. Call your doctor for medical advice about side effects. You may report side effects to FDA at 1-800-FDA-1088. Where should I keep my medication? Keep out of the reach of children. Store at room temperature between 15 and 30 degrees C (59 and 85 degrees F). Protect from light. Throw away any unused medication after the expiration date. NOTE: This sheet is a summary. It may not cover all possible information. If you have questions about this medicine, talk to your doctor, pharmacist, or health care provider.  2024 Elsevier/Gold Standard (2021-07-25 00:00:00)

## 2023-09-24 DIAGNOSIS — C8114 Nodular sclerosis classical Hodgkin lymphoma, lymph nodes of axilla and upper limb: Secondary | ICD-10-CM | POA: Diagnosis not present

## 2023-09-24 DIAGNOSIS — E1129 Type 2 diabetes mellitus with other diabetic kidney complication: Secondary | ICD-10-CM | POA: Diagnosis not present

## 2023-09-24 DIAGNOSIS — Z6823 Body mass index (BMI) 23.0-23.9, adult: Secondary | ICD-10-CM | POA: Diagnosis not present

## 2023-09-24 DIAGNOSIS — D709 Neutropenia, unspecified: Secondary | ICD-10-CM | POA: Diagnosis not present

## 2023-09-24 DIAGNOSIS — R2689 Other abnormalities of gait and mobility: Secondary | ICD-10-CM | POA: Diagnosis not present

## 2023-09-24 DIAGNOSIS — N1831 Chronic kidney disease, stage 3a: Secondary | ICD-10-CM | POA: Diagnosis not present

## 2023-09-24 DIAGNOSIS — M0579 Rheumatoid arthritis with rheumatoid factor of multiple sites without organ or systems involvement: Secondary | ICD-10-CM | POA: Diagnosis not present

## 2023-09-24 DIAGNOSIS — R809 Proteinuria, unspecified: Secondary | ICD-10-CM | POA: Diagnosis not present

## 2023-09-26 DIAGNOSIS — H01021 Squamous blepharitis right upper eyelid: Secondary | ICD-10-CM | POA: Diagnosis not present

## 2023-09-26 DIAGNOSIS — H02889 Meibomian gland dysfunction of unspecified eye, unspecified eyelid: Secondary | ICD-10-CM | POA: Diagnosis not present

## 2023-10-15 ENCOUNTER — Inpatient Hospital Stay: Payer: Medicare HMO

## 2023-10-15 ENCOUNTER — Inpatient Hospital Stay: Payer: Medicare HMO | Attending: Oncology

## 2023-10-15 ENCOUNTER — Other Ambulatory Visit: Payer: Self-pay | Admitting: Oncology

## 2023-10-15 ENCOUNTER — Inpatient Hospital Stay: Payer: Medicare HMO | Admitting: Oncology

## 2023-10-15 VITALS — BP 160/79 | HR 93 | Temp 98.3°F | Resp 14 | Ht 62.5 in | Wt 130.7 lb

## 2023-10-15 DIAGNOSIS — C8110 Nodular sclerosis classical Hodgkin lymphoma, unspecified site: Secondary | ICD-10-CM

## 2023-10-15 DIAGNOSIS — E538 Deficiency of other specified B group vitamins: Secondary | ICD-10-CM

## 2023-10-15 LAB — CBC WITH DIFFERENTIAL (CANCER CENTER ONLY)
Abs Immature Granulocytes: 0 10*3/uL (ref 0.00–0.07)
Basophils Absolute: 0 10*3/uL (ref 0.0–0.1)
Basophils Relative: 0 %
Eosinophils Absolute: 0 10*3/uL (ref 0.0–0.5)
Eosinophils Relative: 1 %
HCT: 32.7 % — ABNORMAL LOW (ref 36.0–46.0)
Hemoglobin: 10.9 g/dL — ABNORMAL LOW (ref 12.0–15.0)
Immature Granulocytes: 0 %
Lymphocytes Relative: 33 %
Lymphs Abs: 1 10*3/uL (ref 0.7–4.0)
MCH: 33.4 pg (ref 26.0–34.0)
MCHC: 33.3 g/dL (ref 30.0–36.0)
MCV: 100.3 fL — ABNORMAL HIGH (ref 80.0–100.0)
Monocytes Absolute: 0.4 10*3/uL (ref 0.1–1.0)
Monocytes Relative: 12 %
Neutro Abs: 1.7 10*3/uL (ref 1.7–7.7)
Neutrophils Relative %: 54 %
Platelet Count: 110 10*3/uL — ABNORMAL LOW (ref 150–400)
RBC: 3.26 MIL/uL — ABNORMAL LOW (ref 3.87–5.11)
RDW: 12.8 % (ref 11.5–15.5)
WBC Count: 3.1 10*3/uL — ABNORMAL LOW (ref 4.0–10.5)
nRBC: 0 % (ref 0.0–0.2)
nRBC: 0 /100{WBCs}

## 2023-10-15 LAB — CMP (CANCER CENTER ONLY)
ALT: 14 U/L (ref 0–44)
AST: 20 U/L (ref 15–41)
Albumin: 4.5 g/dL (ref 3.5–5.0)
Alkaline Phosphatase: 92 U/L (ref 38–126)
Anion gap: 12 (ref 5–15)
BUN: 21 mg/dL (ref 8–23)
CO2: 26 mmol/L (ref 22–32)
Calcium: 9.6 mg/dL (ref 8.9–10.3)
Chloride: 104 mmol/L (ref 98–111)
Creatinine: 0.99 mg/dL (ref 0.44–1.00)
GFR, Estimated: 59 mL/min — ABNORMAL LOW (ref >60)
Glucose, Bld: 138 mg/dL — ABNORMAL HIGH (ref 70–99)
Potassium: 4.4 mmol/L (ref 3.5–5.1)
Sodium: 142 mmol/L (ref 135–145)
Total Bilirubin: 0.4 mg/dL (ref <1.2)
Total Protein: 6.7 g/dL (ref 6.5–8.1)

## 2023-10-15 LAB — FERRITIN: Ferritin: 349 ng/mL — ABNORMAL HIGH (ref 11–307)

## 2023-10-15 LAB — IRON AND TIBC
Iron: 53 ug/dL (ref 28–170)
Saturation Ratios: 18 % (ref 10.4–31.8)
TIBC: 301 ug/dL (ref 250–450)
UIBC: 248 ug/dL

## 2023-10-15 LAB — LACTATE DEHYDROGENASE: LDH: 207 U/L — ABNORMAL HIGH (ref 98–192)

## 2023-10-15 LAB — VITAMIN B12: Vitamin B-12: 497 pg/mL (ref 180–914)

## 2023-10-15 LAB — FOLATE: Folate: 7.5 ng/mL (ref >5.9)

## 2023-10-15 LAB — SEDIMENTATION RATE: Sed Rate: 25 mm/h — ABNORMAL HIGH (ref 0–22)

## 2023-10-15 MED ORDER — CYANOCOBALAMIN 1000 MCG/ML IJ SOLN
1000.0000 ug | Freq: Once | INTRAMUSCULAR | Status: AC
  Administered 2023-10-15: 1000 ug via INTRAMUSCULAR

## 2023-10-15 NOTE — Progress Notes (Deleted)
[facility]  [address] [place],  [place]  [address] [phone]  Clinic Day: 06/14/2023  Referring physician: Weston Settle, MD  HISTORY OF PRESENT ILLNESS:  The patient is a 77 y.o. female with stage IIIA classical Hodgkin lymphoma.  She completed 2 cycles of brentuximab vedotin before completing 5 of 6 planned cycles of Adriamycin/vinblastine/dacarbazine (AVD) chemotherapy in September 2023.  Due to her extreme fatigue and weakness, she did not receive her sixth and final cycle of AVD chemotherapy.  Her PET scan done after her therapy showed her to have had a complete response.  She comes in today for routine follow-up.  Since her last visit, the patient has been doing much better.  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  As it pertains to her Hodgkin lymphoma, she denies having any B symptoms or lymphadenopathy which concerns her for early disease recurrence.    PHYSICAL EXAM:  There were no vitals taken for this visit. Wt Readings from Last 3 Encounters:  08/22/23 133 lb 8 oz (60.6 kg)  07/19/23 134 lb (60.8 kg)  06/17/23 133 lb (60.3 kg)   There is no height or weight on file to calculate BMI. Performance status (ECOG): 1 - Symptomatic but completely ambulatory Physical Exam Constitutional:      Appearance: Normal appearance.     Comments: She looks stronger versus previous visits.  HENT:     Mouth/Throat:     Pharynx: Oropharynx is clear. No oropharyngeal exudate.  Cardiovascular:     Rate and Rhythm: Normal rate and regular rhythm.     Heart sounds: No murmur heard.    No friction rub. No gallop.  Pulmonary:     Breath sounds: Normal breath sounds.  Chest:  Breasts:    Right: No swelling, bleeding, inverted nipple, mass, nipple discharge or skin change.     Left: No swelling, bleeding, inverted nipple, mass, nipple discharge or skin change.  Abdominal:     General:  Bowel sounds are normal. There is no distension.     Palpations: Abdomen is soft. There is no mass.     Tenderness: There is no abdominal tenderness.  Musculoskeletal:        General: No tenderness.     Cervical back: Normal range of motion and neck supple.     Right lower leg: No edema.     Left lower leg: No edema.  Lymphadenopathy:     Cervical: No cervical adenopathy.     Right cervical: No superficial, deep or posterior cervical adenopathy.    Left cervical: No superficial, deep or posterior cervical adenopathy.     Upper Body:     Right upper body: No supraclavicular or axillary adenopathy.     Left upper body: No supraclavicular or axillary adenopathy.     Lower Body: No right inguinal adenopathy. No left inguinal adenopathy.  Skin:    Coloration: Skin is not jaundiced.     Findings: No lesion or rash.  Neurological:     General: No focal deficit present.     Mental Status: She is alert and oriented to person, place, and time. Mental status is at baseline.  Psychiatric:        Mood and Affect: Mood normal.        Behavior: Behavior normal.        Thought Content: Thought content normal.  Judgment: Judgment normal.    LABS:  Latest Reference Range & Units 06/14/23 00:00  WBC  2.9 (E)  RBC 3.87 - 5.11  3.27 ! (E)  Hemoglobin 12.0 - 16.0  11.1 ! (E)  HCT 36 - 46  33 ! (E)  Platelets 150 - 400 K/uL 112 ! (E)  NEUT#  1.54 (E)  !: Data is abnormal (E): External lab result  Latest Reference Range & Units 06/14/23 11:21 06/14/23 13:39  Sodium 135 - 145 mmol/L 137   Potassium 3.5 - 5.1 mmol/L 4.9   Chloride 98 - 111 mmol/L 104   CO2 22 - 32 mmol/L 27   Glucose 70 - 99 mg/dL 409 (H)   BUN 8 - 23 mg/dL 19   Creatinine 8.11 - 1.00 mg/dL 9.14   Calcium 8.9 - 78.2 mg/dL 9.2   Anion gap 5 - 15  6   Alkaline Phosphatase 38 - 126 U/L 91   Albumin 3.5 - 5.0 g/dL 4.1   AST 15 - 41 U/L 19   ALT 0 - 44 U/L 15   Total Protein 6.5 - 8.1 g/dL 6.4 (L)   Total Bilirubin 0.3 -  1.2 mg/dL 0.6   GFR, Est Non African American >60 mL/min >60   LDH 98 - 192 U/L  148  (H): Data is abnormally high (L): Data is abnormally low  ASSESSMENT & PLAN:  Assessment/Plan:  A 77 y.o. female with stage IIIA classical Hodgkin lymphoma, nodular sclerosis subtype.  Based upon her labs and physical exam today, she appears to remain disease-free.  When evaluating her peripheral counts, although they are still low, they are better than what they were previously.  She knows to continue taking her B12 and folate as previously prescribed.  Overall, she continues to do very well.  I will see this patient back in 4 months for repeat clinical assessment.  The patient understands all the plans discussed today and is in agreement with them.    Willodene Stallings Kirby Funk, MD

## 2023-10-15 NOTE — Progress Notes (Unsigned)
[facility]  [address] [place],  [place]  [address] [phone]  Clinic Day:  10/15/2023  Referring physician: Weston Settle, MD   HISTORY OF PRESENT ILLNESS:  The patient is a 77 y.o. female with stage IIIA classical Hodgkin lymphoma.  She completed 2 cycles of brentuximab vedotin before completing 5 of 6 planned cycles of Adriamycin/vinblastine/dacarbazine (AVD) chemotherapy in September 2023.  Due to her extreme fatigue and weakness, she did not receive her sixth and final cycle of AVD chemotherapy.  Her PET scan done after her therapy showed her to have had a complete response.  She comes in today for routine follow-up.  Since her last visit, the patient has been doing okay.  However, she complains of persistent headaches.  She also has persistent abdominal pain, which is a symptom she recalls she had when she was initially diagnosed with Hodgkin lymphoma.  Of note, she has both B12 and folate deficiency.   She takes monthly B12 injections for her B12 deficiency and folate tabs daily for her folic acid deficiency.      PHYSICAL EXAM:  Blood pressure (!) 160/79, pulse 93, temperature 98.3 F (36.8 C), resp. rate 14, height 5' 2.5" (1.588 m), weight 130 lb 11.2 oz (59.3 kg), SpO2 100%. Wt Readings from Last 3 Encounters:  10/15/23 130 lb 11.2 oz (59.3 kg)  08/22/23 133 lb 8 oz (60.6 kg)  07/19/23 134 lb (60.8 kg)   Body mass index is 23.52 kg/m. Performance status (ECOG): 1 - Symptomatic but completely ambulatory Physical Exam Constitutional:      Appearance: Normal appearance. She is not ill-appearing.  HENT:     Mouth/Throat:     Mouth: Mucous membranes are moist.     Pharynx: Oropharynx is clear. No oropharyngeal exudate or posterior oropharyngeal erythema.  Cardiovascular:     Rate and Rhythm: Normal rate and regular rhythm.     Heart sounds: No murmur heard.    No friction rub. No gallop.  Pulmonary:     Effort: Pulmonary effort  is normal. No respiratory distress.     Breath sounds: Normal breath sounds. No wheezing, rhonchi or rales.  Abdominal:     General: Bowel sounds are normal. There is no distension.     Palpations: Abdomen is soft. There is no mass.     Tenderness: There is no abdominal tenderness.  Musculoskeletal:        General: No swelling.     Right lower leg: No edema.     Left lower leg: No edema.  Lymphadenopathy:     Cervical: No cervical adenopathy.     Upper Body:     Right upper body: No supraclavicular or axillary adenopathy.     Left upper body: No supraclavicular or axillary adenopathy.     Lower Body: No right inguinal adenopathy. No left inguinal adenopathy.  Skin:    General: Skin is warm.     Coloration: Skin is not jaundiced.     Findings: No lesion or rash.  Neurological:     General: No focal deficit present.     Mental Status: She is alert and oriented to person, place, and time. Mental status is at baseline.  Psychiatric:        Mood and Affect: Mood normal.        Behavior: Behavior normal.        Thought Content: Thought content normal.     LABS:      Latest  Ref Rng & Units 10/15/2023    2:09 PM 06/14/2023   12:00 AM 02/08/2023   12:00 AM  CBC  WBC 4.0 - 10.5 K/uL 3.1  2.9     2.6      Hemoglobin 12.0 - 15.0 g/dL 16.1  09.6     04.5      Hematocrit 36.0 - 46.0 % 32.7  33     31      Platelets 150 - 400 K/uL 110  112     106         This result is from an external source.      Latest Ref Rng & Units 10/15/2023    2:09 PM 06/14/2023   11:21 AM 02/08/2023   10:33 AM  CMP  Glucose 70 - 99 mg/dL 409  811  914   BUN 8 - 23 mg/dL 21  19  20    Creatinine 0.44 - 1.00 mg/dL 7.82  9.56  2.13   Sodium 135 - 145 mmol/L 142  137  136   Potassium 3.5 - 5.1 mmol/L 4.4  4.9  4.1   Chloride 98 - 111 mmol/L 104  104  101   CO2 22 - 32 mmol/L 26  27  27    Calcium 8.9 - 10.3 mg/dL 9.6  9.2  9.1   Total Protein 6.5 - 8.1 g/dL 6.7  6.4  6.7   Total Bilirubin <1.2 mg/dL 0.4   0.6  0.6   Alkaline Phos 38 - 126 U/L 92  91  81   AST 15 - 41 U/L 20  19  21    ALT 0 - 44 U/L 14  15  16      Latest Reference Range & Units 10/15/23 14:09  LDH 98 - 192 U/L 207 (H)  (H): Data is abnormally high  SED RATE PENDING  ASSESSMENT & PLAN:  Assessment/Plan:  A 77 y.o. female with stage IIIA classical Hodgkin lymphoma, nodular sclerosis subtype.  Based upon her labs and physical exam today, she appears to remain disease-free.  When evaluating her peripheral counts, although they are still low, they are essentially stable.  She knows to continue taking her B12 and folate as previously prescribed.  With respect to her abdominal pain and headaches, scans will be done of these areas to ensure these symptoms do not represent disease recurrence.  I will see this patient back in 2 weeks to go over these scans and their implications.  The patient understands all the plans discussed today and is in agreement with them.      Kamarion Zagami Kirby Funk, MD

## 2023-10-15 NOTE — Patient Instructions (Signed)
 Vitamin B12 Injection What is this medication? Vitamin B12 (VAHY tuh min B12) prevents and treats low vitamin B12 levels in your body. It is used in people who do not get enough vitamin B12 from their diet or when their digestive tract does not absorb enough. Vitamin B12 plays an important role in maintaining the health of your nervous system and red blood cells. This medicine may be used for other purposes; ask your health care provider or pharmacist if you have questions. COMMON BRAND NAME(S): B-12 Compliance Kit, B-12 Injection Kit, Cyomin, Dodex, LA-12, Nutri-Twelve, Physicians EZ Use B-12, Primabalt, Vitamin Deficiency Injectable System - B12 What should I tell my care team before I take this medication? They need to know if you have any of these conditions: Kidney disease Leber's disease Megaloblastic anemia An unusual or allergic reaction to cyanocobalamin, cobalt, other medications, foods, dyes, or preservatives Pregnant or trying to get pregnant Breast-feeding How should I use this medication? This medication is injected into a muscle or deeply under the skin. It is usually given in a clinic or care team's office. However, your care team may teach you how to inject yourself. Follow all instructions. Talk to your care team about the use of this medication in children. Special care may be needed. Overdosage: If you think you have taken too much of this medicine contact a poison control center or emergency room at once. NOTE: This medicine is only for you. Do not share this medicine with others. What if I miss a dose? If you are given your dose at a clinic or care team's office, call to reschedule your appointment. If you give your own injections, and you miss a dose, take it as soon as you can. If it is almost time for your next dose, take only that dose. Do not take double or extra doses. What may interact with this medication? Alcohol Colchicine This list may not describe all possible  interactions. Give your health care provider a list of all the medicines, herbs, non-prescription drugs, or dietary supplements you use. Also tell them if you smoke, drink alcohol, or use illegal drugs. Some items may interact with your medicine. What should I watch for while using this medication? Visit your care team regularly. You may need blood work done while you are taking this medication. You may need to follow a special diet. Talk to your care team. Limit your alcohol intake and avoid smoking to get the best benefit. What side effects may I notice from receiving this medication? Side effects that you should report to your care team as soon as possible: Allergic reactions--skin rash, itching, hives, swelling of the face, lips, tongue, or throat Swelling of the ankles, hands, or feet Trouble breathing Side effects that usually do not require medical attention (report to your care team if they continue or are bothersome): Diarrhea This list may not describe all possible side effects. Call your doctor for medical advice about side effects. You may report side effects to FDA at 1-800-FDA-1088. Where should I keep my medication? Keep out of the reach of children. Store at room temperature between 15 and 30 degrees C (59 and 85 degrees F). Protect from light. Throw away any unused medication after the expiration date. NOTE: This sheet is a summary. It may not cover all possible information. If you have questions about this medicine, talk to your doctor, pharmacist, or health care provider.  2024 Elsevier/Gold Standard (2021-07-25 00:00:00)

## 2023-10-16 ENCOUNTER — Encounter: Payer: Self-pay | Admitting: Oncology

## 2023-10-17 DIAGNOSIS — H9313 Tinnitus, bilateral: Secondary | ICD-10-CM | POA: Diagnosis not present

## 2023-10-17 DIAGNOSIS — H6122 Impacted cerumen, left ear: Secondary | ICD-10-CM | POA: Diagnosis not present

## 2023-10-17 DIAGNOSIS — H9193 Unspecified hearing loss, bilateral: Secondary | ICD-10-CM | POA: Diagnosis not present

## 2023-10-21 ENCOUNTER — Ambulatory Visit: Payer: Medicare HMO

## 2023-10-28 DIAGNOSIS — C8110 Nodular sclerosis classical Hodgkin lymphoma, unspecified site: Secondary | ICD-10-CM | POA: Diagnosis not present

## 2023-10-28 DIAGNOSIS — C8190 Hodgkin lymphoma, unspecified, unspecified site: Secondary | ICD-10-CM | POA: Diagnosis not present

## 2023-10-29 ENCOUNTER — Ambulatory Visit: Payer: Medicare HMO | Admitting: Oncology

## 2023-10-29 DIAGNOSIS — C8110 Nodular sclerosis classical Hodgkin lymphoma, unspecified site: Secondary | ICD-10-CM | POA: Diagnosis not present

## 2023-10-29 DIAGNOSIS — C8190 Hodgkin lymphoma, unspecified, unspecified site: Secondary | ICD-10-CM | POA: Diagnosis not present

## 2023-10-29 NOTE — Progress Notes (Unsigned)
[facility]  [address] [place],  [place]  [address] [phone]  Clinic Day:  10/30/2023  Referring physician: Weston Settle, MD   HISTORY OF PRESENT ILLNESS:  The patient is a 77 y.o. female with stage IIIA classical Hodgkin lymphoma.  She completed 2 cycles of brentuximab vedotin before completing 5 of 6 planned cycles of Adriamycin/vinblastine/dacarbazine (AVD) chemotherapy in September 2023.  Due to her extreme fatigue and weakness, she did not receive her sixth and final cycle of AVD chemotherapy.  Her PET scan done after her therapy showed her to have had a complete response.  She comes in today to go over her scans to ensure she has no radiographic evidence of disease recurrence.  Since her last visit, the patient has been doing well.  She denies having any B symptoms which concern her for overt signs of disease recurrence.    PHYSICAL EXAM:  Blood pressure (!) 162/81, pulse 97, temperature 97.8 F (36.6 C), resp. rate 14, height 5' 2.5" (1.588 m), weight 132 lb 8 oz (60.1 kg), SpO2 100%. Wt Readings from Last 3 Encounters:  10/30/23 132 lb 8 oz (60.1 kg)  10/15/23 130 lb 11.2 oz (59.3 kg)  08/22/23 133 lb 8 oz (60.6 kg)   Body mass index is 23.85 kg/m. Performance status (ECOG): 1 - Symptomatic but completely ambulatory Physical Exam Constitutional:      Appearance: Normal appearance. She is not ill-appearing.  HENT:     Mouth/Throat:     Mouth: Mucous membranes are moist.     Pharynx: Oropharynx is clear. No oropharyngeal exudate or posterior oropharyngeal erythema.  Cardiovascular:     Rate and Rhythm: Normal rate and regular rhythm.     Heart sounds: No murmur heard.    No friction rub. No gallop.  Pulmonary:     Effort: Pulmonary effort is normal. No respiratory distress.     Breath sounds: Normal breath sounds. No wheezing, rhonchi or rales.  Abdominal:     General: Bowel sounds are normal. There is no distension.      Palpations: Abdomen is soft. There is no mass.     Tenderness: There is no abdominal tenderness.  Musculoskeletal:        General: No swelling.     Right lower leg: No edema.     Left lower leg: No edema.  Lymphadenopathy:     Cervical: No cervical adenopathy.     Upper Body:     Right upper body: No supraclavicular or axillary adenopathy.     Left upper body: No supraclavicular or axillary adenopathy.     Lower Body: No right inguinal adenopathy. No left inguinal adenopathy.  Skin:    General: Skin is warm.     Coloration: Skin is not jaundiced.     Findings: No lesion or rash.  Neurological:     General: No focal deficit present.     Mental Status: She is alert and oriented to person, place, and time. Mental status is at baseline.  Psychiatric:        Mood and Affect: Mood normal.        Behavior: Behavior normal.        Thought Content: Thought content normal.   SCANS:  Her brain MRI and the CT scans of her abdomen/pelvis revealed the following: FINDINGS: MRI BRAIN:  No intraparenchymal hemorrhage is evident. No focus of restricted diffusion is identified to suggest acute or early subacute ischemia. There is no  extra-axial fluid collection, mass effect, or shift of midline structures. The basal cisterns are visualized.  The ventricles and cortical sulci are in proportion and consistent with the patient's age. There is mild increased T2 and FLAIR signal in the deep white matter.  The midline structures demonstrate normal contours. The craniocervical junction is unremarkable. The flow voids of the large intracranial vessels are normal. The calvarium is unremarkable. No abnormal enhancement is identified.  There is fluid signal in a portion of the mastoid air cells on the right and left.  IMPRESSION: There is mild increased T2 and FLAIR signal in the deep white matter, likely chronic ischemic changes. There is fluid signal in a portion of the mastoid air cells on the right and  left. ---------------------------------------------------------------------------------------------------------------------------- FINDINGS: The lung bases are clear.  There is fatty infiltration of the liver. No ductal dilatation or mass seen. Gallbladder is surgically absent. The spleen, pancreas and adrenal glands are unremarkable.  Kidneys enhance symmetrically. There are no hydronephrosis. Left renal cyst measures 5.9 x 5.3 cm.  There is diffuse thickening of the gastric pylorus and 1st portion of the duodenum. Otherwise the small bowel loops are unremarkable. The colonic diverticulosis without evidence of acute diverticulitis.  .  No lymphadenopathy. No significant free fluid in the abdomen or pelvis.  No CT evidence of acute osseous abnormality. Extensive vascular calcifications are noted.  IMPRESSION: Nonspecific thickening of the gastric pylorus and 1st portion of the duodenum.  There is no lymphadenopathy.  Hepatic steatosis.  LABS:      Latest Ref Rng & Units 10/15/2023    2:09 PM 06/14/2023   12:00 AM 02/08/2023   12:00 AM  CBC  WBC 4.0 - 10.5 K/uL 3.1  2.9     2.6      Hemoglobin 12.0 - 15.0 g/dL 16.1  09.6     04.5      Hematocrit 36.0 - 46.0 % 32.7  33     31      Platelets 150 - 400 K/uL 110  112     106         This result is from an external source.      Latest Ref Rng & Units 10/15/2023    2:09 PM 06/14/2023   11:21 AM 02/08/2023   10:33 AM  CMP  Glucose 70 - 99 mg/dL 409  811  914   BUN 8 - 23 mg/dL 21  19  20    Creatinine 0.44 - 1.00 mg/dL 7.82  9.56  2.13   Sodium 135 - 145 mmol/L 142  137  136   Potassium 3.5 - 5.1 mmol/L 4.4  4.9  4.1   Chloride 98 - 111 mmol/L 104  104  101   CO2 22 - 32 mmol/L 26  27  27    Calcium 8.9 - 10.3 mg/dL 9.6  9.2  9.1   Total Protein 6.5 - 8.1 g/dL 6.7  6.4  6.7   Total Bilirubin <1.2 mg/dL 0.4  0.6  0.6   Alkaline Phos 38 - 126 U/L 92  91  81   AST 15 - 41 U/L 20  19  21    ALT 0 - 44 U/L 14  15  16      Latest  Reference Range & Units 10/15/23 14:09  Iron 28 - 170 ug/dL 53  UIBC ug/dL 086  TIBC 578 - 469 ug/dL 629  Saturation Ratios 10.4 - 31.8 % 18  Ferritin 11 - 307 ng/mL 349 (H)  Folate >5.9 ng/mL 7.5  Vitamin B12 180 - 914 pg/mL 497  (H): Data is abnormally high   Latest Reference Range & Units 10/15/23 14:09  LDH 98 - 192 U/L 207 (H)  (H): Data is abnormally high   Latest Reference Range & Units 10/15/23 14:09  Sed Rate 0 - 22 mm/hr 25 (H)  (H): Data is abnormally high  ASSESSMENT & PLAN:  Assessment/Plan:  A 77 y.o. female with stage IIIA classical Hodgkin lymphoma, nodular sclerosis subtype.  In clinic today, I went over all of her CT scan images, for which she could see she has no evidence of disease recurrence.  Understandably, the patient was pleased with her CT scans.  Clinically, the patient continues to do well.   She knows to continue taking her monthly B12 shots and folate as previously prescribed.  Otherwise, I will see her back in 4 months for her continued Hodgkin lymphoma surveillance.  The patient understands all the plans discussed today and is in agreement with them.      Melynda Krzywicki Kirby Funk, MD

## 2023-10-30 ENCOUNTER — Inpatient Hospital Stay: Payer: Medicare HMO | Attending: Oncology | Admitting: Oncology

## 2023-10-30 ENCOUNTER — Other Ambulatory Visit: Payer: Self-pay | Admitting: Oncology

## 2023-10-30 VITALS — BP 162/81 | HR 97 | Temp 97.8°F | Resp 14 | Ht 62.5 in | Wt 132.5 lb

## 2023-10-30 DIAGNOSIS — C8110 Nodular sclerosis classical Hodgkin lymphoma, unspecified site: Secondary | ICD-10-CM

## 2023-10-30 DIAGNOSIS — Z8571 Personal history of Hodgkin lymphoma: Secondary | ICD-10-CM | POA: Insufficient documentation

## 2023-10-30 DIAGNOSIS — C8128 Mixed cellularity classical Hodgkin lymphoma, lymph nodes of multiple sites: Secondary | ICD-10-CM | POA: Diagnosis not present

## 2023-11-11 ENCOUNTER — Encounter: Payer: Self-pay | Admitting: Oncology

## 2023-11-18 ENCOUNTER — Encounter: Payer: Self-pay | Admitting: Oncology

## 2023-11-18 ENCOUNTER — Inpatient Hospital Stay: Payer: Medicare HMO

## 2023-11-18 ENCOUNTER — Encounter (HOSPITAL_BASED_OUTPATIENT_CLINIC_OR_DEPARTMENT_OTHER): Payer: Self-pay | Admitting: Emergency Medicine

## 2023-11-18 ENCOUNTER — Other Ambulatory Visit (HOSPITAL_BASED_OUTPATIENT_CLINIC_OR_DEPARTMENT_OTHER): Payer: Self-pay

## 2023-11-18 ENCOUNTER — Ambulatory Visit (HOSPITAL_BASED_OUTPATIENT_CLINIC_OR_DEPARTMENT_OTHER)
Admission: EM | Admit: 2023-11-18 | Discharge: 2023-11-18 | Disposition: A | Discharge status: Home / Self Care | Payer: Medicare HMO | Attending: Internal Medicine | Admitting: Internal Medicine

## 2023-11-18 VITALS — BP 162/77 | HR 92 | Temp 97.5°F | Resp 16

## 2023-11-18 DIAGNOSIS — E538 Deficiency of other specified B group vitamins: Secondary | ICD-10-CM

## 2023-11-18 DIAGNOSIS — J209 Acute bronchitis, unspecified: Secondary | ICD-10-CM | POA: Diagnosis not present

## 2023-11-18 DIAGNOSIS — J029 Acute pharyngitis, unspecified: Secondary | ICD-10-CM

## 2023-11-18 DIAGNOSIS — Z8571 Personal history of Hodgkin lymphoma: Secondary | ICD-10-CM | POA: Diagnosis not present

## 2023-11-18 MED ORDER — AZITHROMYCIN 250 MG PO TABS
250.0000 mg | ORAL_TABLET | Freq: Every day | ORAL | 0 refills | Status: DC
  Filled 2023-11-18: qty 6, 6d supply, fill #0

## 2023-11-18 MED ORDER — CYANOCOBALAMIN 1000 MCG/ML IJ SOLN
1000.0000 ug | Freq: Once | INTRAMUSCULAR | Status: AC
  Administered 2023-11-18: 1000 ug via INTRAMUSCULAR
  Filled 2023-11-18: qty 1

## 2023-11-18 MED ORDER — BENZONATATE 100 MG PO CAPS
100.0000 mg | ORAL_CAPSULE | Freq: Three times a day (TID) | ORAL | 0 refills | Status: DC
  Filled 2023-11-18: qty 21, 7d supply, fill #0

## 2023-11-18 NOTE — ED Provider Notes (Signed)
[facility]    CSN: [id number] Arrival date & time: 11/18/23  1622      History   Chief Complaint No chief complaint on file.   HPI Melanie Miles is a 77 y.o. female.   The history is provided by the patient.  Sore throat worse at night, cough, chest congestion.  Using her inhaler without relief.  Has a history of Hodgkin's lymphoma, asthma, B12 deficiency.  Dates she is concerned the infection is settling in her chest  Past Medical History:  Diagnosis Date   Asthma    Carpal tunnel syndrome    Diabetes (HCC)    TMJ disease     Patient Active Problem List   Diagnosis Date Noted   B12 deficiency 12/10/2022   Abdominal pain 04/03/2022   Neutropenia, drug-induced (HCC) 02/21/2022   Iron deficiency anemia 02/01/2022   Hodgkin lymphoma of lymph nodes of multiple regions (HCC) 01/18/2022   Lymphadenopathy 12/13/2021   Moderate persistent asthma 08/25/2015   Laryngopharyngeal reflux 08/25/2015    Past Surgical History:  Procedure Laterality Date   APPENDECTOMY     CHOLECYSTECTOMY     PARTIAL HYSTERECTOMY     SHOULDER SURGERY     TUMOR REMOVAL      OB History   No obstetric history on file.      Home Medications    Prior to Admission medications   Medication Sig Start Date End Date Taking? Authorizing Provider  azithromycin (ZITHROMAX) 250 MG tablet Take first 2 tablets by mouth together on day 1, then 1 tablet every day until finished. 11/18/23  Yes Meliton Rattan, PA  benzonatate (TESSALON) 100 MG capsule Take 1 capsule (100 mg total) by mouth every 8 (eight) hours. 11/18/23  Yes Meliton Rattan, PA  albuterol (VENTOLIN HFA) 108 (90 BASE) MCG/ACT inhaler Inhale into the lungs every 6 (six) hours as needed for wheezing or shortness of breath. Inhale two puffs every four to six hours as needed.    [provider]  azelastine (ASTELIN) 0.1 % nasal spray Place 2 sprays into both nostrils 2 (two) times daily. 09/07/22   [provider]  Blood Glucose Monitoring Suppl (ONETOUCH VERIO FLEX SYSTEM) w/Device KIT CHECK BLOOD SUGAR ONCE A DAY 12/19/21   [provider]  clobetasol cream (TEMOVATE) 0.05 % SMARTSIG:Sparingly Topical Twice Daily 10/15/22   [provider]  erythromycin ophthalmic ointment 3 (three) times daily. 11/12/22   [provider]  estradiol (ESTRACE) 0.1 MG/GM vaginal cream Place 1 Applicatorful vaginally at bedtime.    [provider]  famotidine (PEPCID) 20 MG tablet Take 20 mg by mouth 2 (two) times daily. 02/13/22   [provider]  fluconazole (DIFLUCAN) 150 MG tablet Take 150 mg by mouth 2 (two) times a week. 10/06/22   [provider]  glucose blood (ONETOUCH ULTRA) test strip  05/31/17   [provider]  hydroxychloroquine (PLAQUENIL) 200 MG tablet Take 200 mg by mouth daily.    [provider]  ketorolac (ACULAR) 0.5 % ophthalmic solution SMARTSIG:In Eye(s) 10/27/21   [provider]  Lancets (ONETOUCH DELICA PLUS LANCET33G) MISC Apply topically 2 (two) times daily. 12/23/21   [provider]  LORazepam (ATIVAN) 1 MG tablet Take 1 tablet (1 mg total) by mouth every 8 (eight) hours. 08/31/22   Lewis, Dequincy A, MD  magic mouthwash w/lidocaine SOLN Take 15 mLs by mouth 3 (three) times daily as needed for mouth pain. 08/30/22   Lewis, Dequincy A,  MD  mupirocin ointment (BACTROBAN) 2 % Apply topically. 05/30/18   [provider]  nitrofurantoin, macrocrystal-monohydrate, (MACROBID) 100 MG capsule Take 100 mg by mouth 2 (two) times daily. 10/15/22   [provider]  nystatin (MYCOSTATIN) 100000 UNIT/ML suspension Take 5 mLs (500,000 Units total) by mouth 4 (four) times daily. 03/29/22   Ilda Basset A, NP  omeprazole (PRILOSEC) 40 MG capsule Take by mouth.    [provider]  pioglitazone (ACTOS) 30 MG tablet Take by mouth.    [provider]  prednisoLONE acetate (PRED FORTE) 1  % ophthalmic suspension SMARTSIG:In Eye(s) 11/12/22   [provider]  terconazole (TERAZOL 7) 0.4 % vaginal cream Place 1 applicator vaginally at bedtime. 10/10/22   [provider]  prochlorperazine (COMPAZINE) 10 MG tablet Take 1 tablet (10 mg total) by mouth every 6 (six) hours as needed (Nausea or vomiting). 01/24/22 07/25/22  Weston Settle, MD    Family History Family History  Problem Relation Age of Onset   Dementia Mother    Diabetes Mother    Breast cancer Sister    Rheum arthritis Sister    Diabetes Brother    Arthritis Brother    Cancer Father    Arthritis Father    Cancer Maternal Grandmother    Cancer Maternal Grandfather    Asthma Paternal Grandmother    Arthritis Brother     Social History Social History   Tobacco Use   Smoking status: Never   Smokeless tobacco: Never  Substance Use Topics   Alcohol use: No   Drug use: Never     Allergies   Penicillins, Phenazopyridine hcl, Sulfa antibiotics, Butorphanol, Ciprofloxacin, and Levofloxacin   Review of Systems Review of Systems  Constitutional:  Positive for fatigue. Negative for chills and fever.  HENT:  Positive for sore throat. Negative for congestion, trouble swallowing and voice change.   Respiratory:  Positive for cough, shortness of breath and wheezing.   Cardiovascular:  Negative for chest pain.     Physical Exam Triage Vital Signs ED Triage Vitals  Encounter Vitals Group     BP 11/18/23 1632 (!) 152/82     Systolic BP Percentile --      Diastolic BP Percentile --      Pulse Rate 11/18/23 1632 90     Resp 11/18/23 1632 18     Temp 11/18/23 1632 97.6 F (36.4 C)     Temp Source 11/18/23 1632 Oral     SpO2 11/18/23 1632 98 %     Weight --      Height --      Head Circumference --      Peak Flow --      Pain Score 11/18/23 1630 3     Pain Loc --      Pain Education --      Exclude from Growth Chart --    No data found.  Updated Vital Signs BP (!) 152/82 (BP  Location: Right Arm)   Pulse 90   Temp 97.6 F (36.4 C) (Oral)   Resp 18   SpO2 98%   Visual Acuity Right Eye Distance:   Left Eye Distance:   Bilateral Distance:    Right Eye Near:   Left Eye Near:    Bilateral Near:     Physical Exam Vitals and nursing note reviewed.  Constitutional:      Appearance: She is not ill-appearing.  HENT:     Head: Normocephalic.  Right Ear: Tympanic membrane and ear canal normal.     Left Ear: Tympanic membrane and ear canal normal.     Nose: Congestion present. No rhinorrhea.     Mouth/Throat:     Mouth: Mucous membranes are moist.  Cardiovascular:     Rate and Rhythm: Normal rate and regular rhythm.  Pulmonary:     Effort: Pulmonary effort is normal. No respiratory distress.     Breath sounds: No wheezing, rhonchi or rales.  Skin:    General: Skin is warm.  Neurological:     Mental Status: She is alert and oriented to person, place, and time.      UC Treatments / Results  Labs (all labs ordered are listed, but only abnormal results are displayed) Labs Reviewed - No data to display  EKG   Radiology No results found.  Procedures Procedures (including critical care time)  Medications Ordered in UC Medications - No data to display  Initial Impression / Assessment and Plan / UC Course  I have reviewed the triage vital signs and the nursing notes.  Pertinent labs & imaging results that were available during my care of the patient were reviewed by me and considered in my medical decision making (see chart for details).       Final Clinical Impressions(s) / UC Diagnoses   Final diagnoses:  Sore throat  Acute bronchitis, unspecified organism   Discharge Instructions   None    ED Prescriptions     Medication Sig Dispense Auth. Provider   azithromycin (ZITHROMAX) 250 MG tablet Take first 2 tablets by mouth together on day 1, then 1 tablet every day until finished. 6 tablet Meliton Rattan, PA   benzonatate  (TESSALON) 100 MG capsule Take 1 capsule (100 mg total) by mouth every 8 (eight) hours. 21 capsule Meliton Rattan, [place]      PDMP not reviewed this encounter.   Meliton Rattan, [place] 11/18/23 1650

## 2023-11-18 NOTE — Patient Instructions (Signed)
 Vitamin B12 Injection What is this medication? Vitamin B12 (VAHY tuh min B12) prevents and treats low vitamin B12 levels in your body. It is used in people who do not get enough vitamin B12 from their diet or when their digestive tract does not absorb enough. Vitamin B12 plays an important role in maintaining the health of your nervous system and red blood cells. This medicine may be used for other purposes; ask your health care provider or pharmacist if you have questions. COMMON BRAND NAME(S): B-12 Compliance Kit, B-12 Injection Kit, Cyomin, Dodex, LA-12, Nutri-Twelve, Physicians EZ Use B-12, Primabalt, Vitamin Deficiency Injectable System - B12 What should I tell my care team before I take this medication? They need to know if you have any of these conditions: Kidney disease Leber's disease Megaloblastic anemia An unusual or allergic reaction to cyanocobalamin, cobalt, other medications, foods, dyes, or preservatives Pregnant or trying to get pregnant Breast-feeding How should I use this medication? This medication is injected into a muscle or deeply under the skin. It is usually given in a clinic or care team's office. However, your care team may teach you how to inject yourself. Follow all instructions. Talk to your care team about the use of this medication in children. Special care may be needed. Overdosage: If you think you have taken too much of this medicine contact a poison control center or emergency room at once. NOTE: This medicine is only for you. Do not share this medicine with others. What if I miss a dose? If you are given your dose at a clinic or care team's office, call to reschedule your appointment. If you give your own injections, and you miss a dose, take it as soon as you can. If it is almost time for your next dose, take only that dose. Do not take double or extra doses. What may interact with this medication? Alcohol Colchicine This list may not describe all possible  interactions. Give your health care provider a list of all the medicines, herbs, non-prescription drugs, or dietary supplements you use. Also tell them if you smoke, drink alcohol, or use illegal drugs. Some items may interact with your medicine. What should I watch for while using this medication? Visit your care team regularly. You may need blood work done while you are taking this medication. You may need to follow a special diet. Talk to your care team. Limit your alcohol intake and avoid smoking to get the best benefit. What side effects may I notice from receiving this medication? Side effects that you should report to your care team as soon as possible: Allergic reactions--skin rash, itching, hives, swelling of the face, lips, tongue, or throat Swelling of the ankles, hands, or feet Trouble breathing Side effects that usually do not require medical attention (report to your care team if they continue or are bothersome): Diarrhea This list may not describe all possible side effects. Call your doctor for medical advice about side effects. You may report side effects to FDA at 1-800-FDA-1088. Where should I keep my medication? Keep out of the reach of children. Store at room temperature between 15 and 30 degrees C (59 and 85 degrees F). Protect from light. Throw away any unused medication after the expiration date. NOTE: This sheet is a summary. It may not cover all possible information. If you have questions about this medicine, talk to your doctor, pharmacist, or health care provider.  2024 Elsevier/Gold Standard (2021-07-25 00:00:00)

## 2023-11-18 NOTE — ED Triage Notes (Signed)
Pt c/o sore throat worse at night and coughing x 2 days.

## 2023-12-03 DIAGNOSIS — H01021 Squamous blepharitis right upper eyelid: Secondary | ICD-10-CM | POA: Diagnosis not present

## 2023-12-16 ENCOUNTER — Inpatient Hospital Stay: Payer: Medicare HMO | Attending: Oncology

## 2023-12-16 VITALS — BP 151/68 | HR 92 | Temp 98.0°F | Resp 18

## 2023-12-16 DIAGNOSIS — E538 Deficiency of other specified B group vitamins: Secondary | ICD-10-CM | POA: Diagnosis not present

## 2023-12-16 MED ORDER — SODIUM CHLORIDE 0.9% FLUSH
10.0000 mL | INTRAVENOUS | Status: DC | PRN
  Administered 2023-12-16: 10 mL

## 2023-12-16 MED ORDER — CYANOCOBALAMIN 1000 MCG/ML IJ SOLN
1000.0000 ug | Freq: Once | INTRAMUSCULAR | Status: AC
Start: 2023-12-16 — End: 2023-12-16
  Administered 2023-12-16: 1000 ug via INTRAMUSCULAR
  Filled 2023-12-16: qty 1

## 2023-12-16 MED ORDER — HEPARIN SOD (PORK) LOCK FLUSH 100 UNIT/ML IV SOLN
500.0000 [IU] | Freq: Once | INTRAVENOUS | Status: AC | PRN
  Administered 2023-12-16: 500 [IU]

## 2023-12-16 NOTE — Patient Instructions (Signed)
Vitamin B12 Deficiency Vitamin B12 deficiency occurs when the body does not have enough of this important vitamin. The body needs this vitamin: To make red blood cells. To make DNA. This is the genetic material inside cells. To help the nerves work properly so they can carry messages from the brain to the body. Vitamin B12 deficiency can cause health problems, such as not having enough red blood cells in the blood (anemia). This can lead to nerve damage if untreated. What are the causes? This condition may be caused by: Not eating enough foods that contain vitamin B12. Not having enough stomach acid and digestive fluids to properly absorb vitamin B12 from the food that you eat. Having certain diseases that make it hard to absorb vitamin B12. These diseases include Crohn's disease, chronic pancreatitis, and cystic fibrosis. An autoimmune disorder in which the body does not make enough of a protein (intrinsic factor) within the stomach, resulting in not enough absorption of vitamin B12. Having a surgery in which part of the stomach or small intestine is removed. Taking certain medicines that make it hard for the body to absorb vitamin B12. These include: Heartburn medicines, such as antacids and proton pump inhibitors. Some medicines that are used to treat diabetes. What increases the risk? The following factors may make you more likely to develop a vitamin B12 deficiency: Being an older adult. Eating a vegetarian or vegan diet that does not include any foods that come from animals. Eating a poor diet while you are pregnant. Taking certain medicines. Having alcoholism. What are the signs or symptoms? In some cases, there are no symptoms of this condition. If the condition leads to anemia or nerve damage, various symptoms may occur, such as: Weakness. Tiredness (fatigue). Loss of appetite. Numbness or tingling in your hands and feet. Redness and burning of the tongue. Depression,  confusion, or memory problems. Trouble walking. If anemia is severe, symptoms can include: Shortness of breath. Dizziness. Rapid heart rate. How is this diagnosed? This condition may be diagnosed with a blood test to measure the level of vitamin B12 in your blood. You may also have other tests, including: A group of tests that measure certain characteristics of blood cells (complete blood count, CBC). A blood test to measure intrinsic factor. A procedure where a thin tube with a camera on the end is used to look into your stomach or intestines (endoscopy). Other tests may be needed to discover the cause of the deficiency. How is this treated? Treatment for this condition depends on the cause. This condition may be treated by: Changing your eating and drinking habits, such as: Eating more foods that contain vitamin B12. Drinking less alcohol or no alcohol. Getting vitamin B12 injections. Taking vitamin B12 supplements by mouth (orally). Your health care provider will tell you which dose is best for you. Follow these instructions at home: Eating and drinking  Include foods in your diet that come from animals and contain a lot of vitamin B12. These include: Meats and poultry. This includes beef, pork, chicken, turkey, and organ meats, such as liver. Seafood. This includes clams, rainbow trout, salmon, tuna, and haddock. Eggs. Dairy foods such as milk, yogurt, and cheese. Eat foods that have vitamin B12 added to them (are fortified), such as ready-to-eat breakfast cereals. Check the label on the package to see if a food is fortified. The items listed above may not be a complete list of foods and beverages you can eat and drink. Contact a dietitian for   more information. Alcohol use Do not drink alcohol if: Your health care provider tells you not to drink. You are pregnant, may be pregnant, or are planning to become pregnant. If you drink alcohol: Limit how much you have to: 0-1 drink a  day for women. 0-2 drinks a day for men. Know how much alcohol is in your drink. In the [place] one drink equals one 12 oz bottle of beer (355 mL), one 5 oz glass of wine (148 mL), or one 1 oz glass of hard liquor (44 mL). General instructions Get vitamin B12 injections if told to by your health care provider. Take supplements only as told by your health care provider. Follow the directions carefully. Keep all follow-up visits. This is important. Contact a health care provider if: Your symptoms come back. Your symptoms get worse or do not improve with treatment. Get help right away: You develop shortness of breath. You have a rapid heart rate. You have chest pain. You become dizzy or you faint. These symptoms may be an emergency. Get help right away. Call 911. Do not wait to see if the symptoms will go away. Do not drive yourself to the hospital. Summary Vitamin B12 deficiency occurs when the body does not have enough of this important vitamin. Common causes include not eating enough foods that contain vitamin B12, not being able to absorb vitamin B12 from the food that you eat, having a surgery in which part of the stomach or small intestine is removed, or taking certain medicines. Eat foods that have vitamin B12 in them. Treatment may include making a change in the way you eat and drink, getting vitamin B12 injections, or taking vitamin B12 supplements. This information is not intended to replace advice given to you by your health care provider. Make sure you discuss any questions you have with your health care provider. Document Revised: 07/07/2021 Document Reviewed: 07/07/2021 Elsevier Patient Education  2024 [employer]  

## 2024-01-13 ENCOUNTER — Inpatient Hospital Stay: Payer: Medicare HMO | Attending: Oncology

## 2024-01-13 VITALS — BP 148/79 | HR 80 | Temp 98.1°F | Resp 18

## 2024-01-13 DIAGNOSIS — E538 Deficiency of other specified B group vitamins: Secondary | ICD-10-CM | POA: Insufficient documentation

## 2024-01-13 MED ORDER — CYANOCOBALAMIN 1000 MCG/ML IJ SOLN
1000.0000 ug | Freq: Once | INTRAMUSCULAR | Status: AC
  Administered 2024-01-13: 1000 ug via INTRAMUSCULAR
  Filled 2024-01-13: qty 1

## 2024-01-13 NOTE — Patient Instructions (Signed)
 Vitamin B12 Injection What is this medication? Vitamin B12 (VAHY tuh min B12) prevents and treats low vitamin B12 levels in your body. It is used in people who do not get enough vitamin B12 from their diet or when their digestive tract does not absorb enough. Vitamin B12 plays an important role in maintaining the health of your nervous system and red blood cells. This medicine may be used for other purposes; ask your health care provider or pharmacist if you have questions. COMMON BRAND NAME(S): B-12 Compliance Kit, B-12 Injection Kit, Cyomin, Dodex, LA-12, Nutri-Twelve, Physicians EZ Use B-12, Primabalt, Vitamin Deficiency Injectable System - B12 What should I tell my care team before I take this medication? They need to know if you have any of these conditions: Kidney disease Leber's disease Megaloblastic anemia An unusual or allergic reaction to cyanocobalamin, cobalt, other medications, foods, dyes, or preservatives Pregnant or trying to get pregnant Breast-feeding How should I use this medication? This medication is injected into a muscle or deeply under the skin. It is usually given in a clinic or care team's office. However, your care team may teach you how to inject yourself. Follow all instructions. Talk to your care team about the use of this medication in children. Special care may be needed. Overdosage: If you think you have taken too much of this medicine contact a poison control center or emergency room at once. NOTE: This medicine is only for you. Do not share this medicine with others. What if I miss a dose? If you are given your dose at a clinic or care team's office, call to reschedule your appointment. If you give your own injections, and you miss a dose, take it as soon as you can. If it is almost time for your next dose, take only that dose. Do not take double or extra doses. What may interact with this medication? Alcohol Colchicine This list may not describe all possible  interactions. Give your health care provider a list of all the medicines, herbs, non-prescription drugs, or dietary supplements you use. Also tell them if you smoke, drink alcohol, or use illegal drugs. Some items may interact with your medicine. What should I watch for while using this medication? Visit your care team regularly. You may need blood work done while you are taking this medication. You may need to follow a special diet. Talk to your care team. Limit your alcohol intake and avoid smoking to get the best benefit. What side effects may I notice from receiving this medication? Side effects that you should report to your care team as soon as possible: Allergic reactions--skin rash, itching, hives, swelling of the face, lips, tongue, or throat Swelling of the ankles, hands, or feet Trouble breathing Side effects that usually do not require medical attention (report to your care team if they continue or are bothersome): Diarrhea This list may not describe all possible side effects. Call your doctor for medical advice about side effects. You may report side effects to FDA at 1-800-FDA-1088. Where should I keep my medication? Keep out of the reach of children. Store at room temperature between 15 and 30 degrees C (59 and 85 degrees F). Protect from light. Throw away any unused medication after the expiration date. NOTE: This sheet is a summary. It may not cover all possible information. If you have questions about this medicine, talk to your doctor, pharmacist, or health care provider.  2024 Elsevier/Gold Standard (2021-07-25 00:00:00)

## 2024-01-20 DIAGNOSIS — R809 Proteinuria, unspecified: Secondary | ICD-10-CM | POA: Diagnosis not present

## 2024-01-20 DIAGNOSIS — E1129 Type 2 diabetes mellitus with other diabetic kidney complication: Secondary | ICD-10-CM | POA: Diagnosis not present

## 2024-01-20 DIAGNOSIS — R509 Fever, unspecified: Secondary | ICD-10-CM | POA: Diagnosis not present

## 2024-01-20 DIAGNOSIS — J40 Bronchitis, not specified as acute or chronic: Secondary | ICD-10-CM | POA: Diagnosis not present

## 2024-01-20 DIAGNOSIS — Z6823 Body mass index (BMI) 23.0-23.9, adult: Secondary | ICD-10-CM | POA: Diagnosis not present

## 2024-01-20 DIAGNOSIS — K219 Gastro-esophageal reflux disease without esophagitis: Secondary | ICD-10-CM | POA: Diagnosis not present

## 2024-01-20 DIAGNOSIS — J329 Chronic sinusitis, unspecified: Secondary | ICD-10-CM | POA: Diagnosis not present

## 2024-02-10 ENCOUNTER — Inpatient Hospital Stay: Payer: Medicare HMO | Attending: Oncology

## 2024-02-10 VITALS — BP 144/60 | HR 79 | Temp 97.7°F

## 2024-02-10 DIAGNOSIS — E538 Deficiency of other specified B group vitamins: Secondary | ICD-10-CM

## 2024-02-10 MED ORDER — CYANOCOBALAMIN 1000 MCG/ML IJ SOLN
1000.0000 ug | Freq: Once | INTRAMUSCULAR | Status: DC
Start: 2024-02-10 — End: 2024-02-10
  Filled 2024-02-10: qty 1

## 2024-02-10 NOTE — Patient Instructions (Signed)
 Vitamin B12 Injection What is this medication? Vitamin B12 (VAHY tuh min B12) prevents and treats low vitamin B12 levels in your body. It is used in people who do not get enough vitamin B12 from their diet or when their digestive tract does not absorb enough. Vitamin B12 plays an important role in maintaining the health of your nervous system and red blood cells. This medicine may be used for other purposes; ask your health care provider or pharmacist if you have questions. COMMON BRAND NAME(S): B-12 Compliance Kit, B-12 Injection Kit, Cyomin, Dodex, LA-12, Nutri-Twelve, Physicians EZ Use B-12, Primabalt, Vitamin Deficiency Injectable System - B12 What should I tell my care team before I take this medication? They need to know if you have any of these conditions: Kidney disease Leber's disease Megaloblastic anemia An unusual or allergic reaction to cyanocobalamin, cobalt, other medications, foods, dyes, or preservatives Pregnant or trying to get pregnant Breast-feeding How should I use this medication? This medication is injected into a muscle or deeply under the skin. It is usually given in a clinic or care team's office. However, your care team may teach you how to inject yourself. Follow all instructions. Talk to your care team about the use of this medication in children. Special care may be needed. Overdosage: If you think you have taken too much of this medicine contact a poison control center or emergency room at once. NOTE: This medicine is only for you. Do not share this medicine with others. What if I miss a dose? If you are given your dose at a clinic or care team's office, call to reschedule your appointment. If you give your own injections, and you miss a dose, take it as soon as you can. If it is almost time for your next dose, take only that dose. Do not take double or extra doses. What may interact with this medication? Alcohol Colchicine This list may not describe all possible  interactions. Give your health care provider a list of all the medicines, herbs, non-prescription drugs, or dietary supplements you use. Also tell them if you smoke, drink alcohol, or use illegal drugs. Some items may interact with your medicine. What should I watch for while using this medication? Visit your care team regularly. You may need blood work done while you are taking this medication. You may need to follow a special diet. Talk to your care team. Limit your alcohol intake and avoid smoking to get the best benefit. What side effects may I notice from receiving this medication? Side effects that you should report to your care team as soon as possible: Allergic reactions--skin rash, itching, hives, swelling of the face, lips, tongue, or throat Swelling of the ankles, hands, or feet Trouble breathing Side effects that usually do not require medical attention (report to your care team if they continue or are bothersome): Diarrhea This list may not describe all possible side effects. Call your doctor for medical advice about side effects. You may report side effects to FDA at 1-800-FDA-1088. Where should I keep my medication? Keep out of the reach of children. Store at room temperature between 15 and 30 degrees C (59 and 85 degrees F). Protect from light. Throw away any unused medication after the expiration date. NOTE: This sheet is a summary. It may not cover all possible information. If you have questions about this medicine, talk to your doctor, pharmacist, or health care provider.  2024 Elsevier/Gold Standard (2021-07-25 00:00:00)

## 2024-02-17 DIAGNOSIS — E119 Type 2 diabetes mellitus without complications: Secondary | ICD-10-CM | POA: Diagnosis not present

## 2024-02-17 DIAGNOSIS — L853 Xerosis cutis: Secondary | ICD-10-CM | POA: Diagnosis not present

## 2024-02-17 DIAGNOSIS — K219 Gastro-esophageal reflux disease without esophagitis: Secondary | ICD-10-CM | POA: Diagnosis not present

## 2024-02-17 DIAGNOSIS — M19041 Primary osteoarthritis, right hand: Secondary | ICD-10-CM | POA: Diagnosis not present

## 2024-02-17 DIAGNOSIS — Z8719 Personal history of other diseases of the digestive system: Secondary | ICD-10-CM | POA: Diagnosis not present

## 2024-02-17 DIAGNOSIS — M1990 Unspecified osteoarthritis, unspecified site: Secondary | ICD-10-CM | POA: Diagnosis not present

## 2024-02-17 DIAGNOSIS — M5431 Sciatica, right side: Secondary | ICD-10-CM | POA: Diagnosis not present

## 2024-02-17 DIAGNOSIS — E079 Disorder of thyroid, unspecified: Secondary | ICD-10-CM | POA: Diagnosis not present

## 2024-02-17 DIAGNOSIS — M069 Rheumatoid arthritis, unspecified: Secondary | ICD-10-CM | POA: Diagnosis not present

## 2024-02-17 DIAGNOSIS — Z79899 Other long term (current) drug therapy: Secondary | ICD-10-CM | POA: Diagnosis not present

## 2024-02-17 DIAGNOSIS — M47816 Spondylosis without myelopathy or radiculopathy, lumbar region: Secondary | ICD-10-CM | POA: Diagnosis not present

## 2024-02-17 DIAGNOSIS — E559 Vitamin D deficiency, unspecified: Secondary | ICD-10-CM | POA: Diagnosis not present

## 2024-02-27 NOTE — Progress Notes (Deleted)
 [facility]  [address] [place],  [place]  [address] [phone]  Clinic Day:  02/27/2024  Referring physician: Buckner Malta, MD   HISTORY OF PRESENT ILLNESS:  The patient is a 78 y.o. female with stage IIIA classical Hodgkin lymphoma.  She completed 2 cycles of brentuximab vedotin before completing 5 of 6 planned cycles of Adriamycin/vinblastine/dacarbazine (AVD) chemotherapy in September 2023.  Due to her extreme fatigue and weakness, she did not receive her sixth and final cycle of AVD chemotherapy.  Her PET scan done after her therapy showed her to have had a complete response.  She comes in today to go over her scans to ensure she has no radiographic evidence of disease recurrence.  Since her last visit, the patient has been doing well.  She denies having any B symptoms which concern her for overt signs of disease recurrence.    PHYSICAL EXAM:  There were no vitals taken for this visit. Wt Readings from Last 3 Encounters:  10/30/23 132 lb 8 oz (60.1 kg)  10/15/23 130 lb 11.2 oz (59.3 kg)  08/22/23 133 lb 8 oz (60.6 kg)   There is no height or weight on file to calculate BMI. Performance status (ECOG): 1 - Symptomatic but completely ambulatory Physical Exam Constitutional:      Appearance: Normal appearance. She is not ill-appearing.  HENT:     Mouth/Throat:     Mouth: Mucous membranes are moist.     Pharynx: Oropharynx is clear. No oropharyngeal exudate or posterior oropharyngeal erythema.  Cardiovascular:     Rate and Rhythm: Normal rate and regular rhythm.     Heart sounds: No murmur heard.    No friction rub. No gallop.  Pulmonary:     Effort: Pulmonary effort is normal. No respiratory distress.     Breath sounds: Normal breath sounds. No wheezing, rhonchi or rales.  Abdominal:     General: Bowel sounds are normal. There is no distension.     Palpations: Abdomen is soft. There is no mass.     Tenderness: There is no abdominal  tenderness.  Musculoskeletal:        General: No swelling.     Right lower leg: No edema.     Left lower leg: No edema.  Lymphadenopathy:     Cervical: No cervical adenopathy.     Upper Body:     Right upper body: No supraclavicular or axillary adenopathy.     Left upper body: No supraclavicular or axillary adenopathy.     Lower Body: No right inguinal adenopathy. No left inguinal adenopathy.  Skin:    General: Skin is warm.     Coloration: Skin is not jaundiced.     Findings: No lesion or rash.  Neurological:     General: No focal deficit present.     Mental Status: She is alert and oriented to person, place, and time. Mental status is at baseline.  Psychiatric:        Mood and Affect: Mood normal.        Behavior: Behavior normal.        Thought Content: Thought content normal.   SCANS:  Her brain MRI and the CT scans of her abdomen/pelvis revealed the following: FINDINGS: MRI BRAIN:  No intraparenchymal hemorrhage is evident. No focus of restricted diffusion is identified to suggest acute or early subacute ischemia. There is no extra-axial fluid collection, mass effect, or shift of midline structures. The basal cisterns are visualized.  The ventricles and cortical sulci are in proportion and consistent with the patient's age. There is mild increased T2 and FLAIR signal in the deep white matter.  The midline structures demonstrate normal contours. The craniocervical junction is unremarkable. The flow voids of the large intracranial vessels are normal. The calvarium is unremarkable. No abnormal enhancement is identified.  There is fluid signal in a portion of the mastoid air cells on the right and left.  IMPRESSION: There is mild increased T2 and FLAIR signal in the deep white matter, likely chronic ischemic changes. There is fluid signal in a portion of the mastoid air cells on the right and  left. ---------------------------------------------------------------------------------------------------------------------------- FINDINGS: The lung bases are clear.  There is fatty infiltration of the liver. No ductal dilatation or mass seen. Gallbladder is surgically absent. The spleen, pancreas and adrenal glands are unremarkable.  Kidneys enhance symmetrically. There are no hydronephrosis. Left renal cyst measures 5.9 x 5.3 cm.  There is diffuse thickening of the gastric pylorus and 1st portion of the duodenum. Otherwise the small bowel loops are unremarkable. The colonic diverticulosis without evidence of acute diverticulitis.  .  No lymphadenopathy. No significant free fluid in the abdomen or pelvis.  No CT evidence of acute osseous abnormality. Extensive vascular calcifications are noted.  IMPRESSION: Nonspecific thickening of the gastric pylorus and 1st portion of the duodenum.  There is no lymphadenopathy.  Hepatic steatosis.  LABS:      Latest Ref Rng & Units 10/15/2023    2:09 PM 06/14/2023   12:00 AM 02/08/2023   12:00 AM  CBC  WBC 4.0 - 10.5 K/uL 3.1  2.9     2.6      Hemoglobin 12.0 - 15.0 g/dL 21.3  08.6     57.8      Hematocrit 36.0 - 46.0 % 32.7  33     31      Platelets 150 - 400 K/uL 110  112     106         This result is from an external source.      Latest Ref Rng & Units 10/15/2023    2:09 PM 06/14/2023   11:21 AM 02/08/2023   10:33 AM  CMP  Glucose 70 - 99 mg/dL 469  629  528   BUN 8 - 23 mg/dL 21  19  20    Creatinine 0.44 - 1.00 mg/dL 4.13  2.44  0.10   Sodium 135 - 145 mmol/L 142  137  136   Potassium 3.5 - 5.1 mmol/L 4.4  4.9  4.1   Chloride 98 - 111 mmol/L 104  104  101   CO2 22 - 32 mmol/L 26  27  27    Calcium 8.9 - 10.3 mg/dL 9.6  9.2  9.1   Total Protein 6.5 - 8.1 g/dL 6.7  6.4  6.7   Total Bilirubin <1.2 mg/dL 0.4  0.6  0.6   Alkaline Phos 38 - 126 U/L 92  91  81   AST 15 - 41 U/L 20  19  21    ALT 0 - 44 U/L 14  15  16      Latest  Reference Range & Units 10/15/23 14:09  Iron 28 - 170 ug/dL 53  UIBC ug/dL 272  TIBC 536 - 644 ug/dL 034  Saturation Ratios 10.4 - 31.8 % 18  Ferritin 11 - 307 ng/mL 349 (H)  Folate >5.9 ng/mL 7.5  Vitamin B12 180 - 914 pg/mL 497  (H): Data  is abnormally high   Latest Reference Range & Units 10/15/23 14:09  LDH 98 - 192 U/L 207 (H)  (H): Data is abnormally high   Latest Reference Range & Units 10/15/23 14:09  Sed Rate 0 - 22 mm/hr 25 (H)  (H): Data is abnormally high  ASSESSMENT & PLAN:  Assessment/Plan:  A 78 y.o. female with stage IIIA classical Hodgkin lymphoma, nodular sclerosis subtype.  In clinic today, I went over all of her CT scan images, for which she could see she has no evidence of disease recurrence.  Understandably, the patient was pleased with her CT scans.  Clinically, the patient continues to do well.   She knows to continue taking her monthly B12 shots and folate as previously prescribed.  Otherwise, I will see her back in 4 months for her continued Hodgkin lymphoma surveillance.  The patient understands all the plans discussed today and is in agreement with them.      Stephaney Steven Kirby Funk, MD

## 2024-02-28 ENCOUNTER — Inpatient Hospital Stay: Payer: Medicare HMO

## 2024-02-28 ENCOUNTER — Inpatient Hospital Stay: Payer: Medicare HMO | Attending: Oncology | Admitting: Oncology

## 2024-03-06 NOTE — Progress Notes (Cosign Needed)
 [facility]  [address] [place],  [place]  [address] [phone]  Clinic Day:  03/09/2024  Referring physician: Harvest Lineman, MD   HISTORY OF PRESENT ILLNESS:  The patient is a 78 y.o. 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  She completed 2 cycles of brentuximab vedotin before completing 5 of 6 planned cycles of Adriamycin/vinblastine/dacarbazine (AVD) chemotherapy in September 2023.  Due to her extreme fatigue and weakness, she did not receive her sixth and final cycle of AVD chemotherapy.  PET scan done after her therapy showed her to have had a complete response.  CT chest, abdomen and pelvis, as well as MRI brain in December did not reveal any radiographic evidence of disease recurrence.  Since her last visit, the patient has been doing fairly well.  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 She also reports continued tinnitus and vertigo. She otherwise denies having any B symptoms which concern her for overt signs of disease recurrence.     PHYSICAL EXAM:  Blood pressure (!) 163/78, pulse 86, temperature 98.1 F (36.7 C), temperature source Oral, resp. rate 20, height 5' 2.5" (1.588 m), weight 134 lb 12.8 oz (61.1 kg), SpO2 99%. Wt Readings from Last 3 Encounters:  03/09/24 134 lb 12.8 oz (61.1 kg)  10/30/23 132 lb 8 oz (60.1 kg)  10/15/23 130 lb 11.2 oz (59.3 kg)   Body mass index is 24.26 kg/m.  Performance status (ECOG): 1 - Symptomatic but completely ambulatory  Physical Exam Vitals and nursing note reviewed.  Constitutional:      General: She is not in acute distress.    Appearance: Normal appearance.  HENT:     Head: Normocephalic and atraumatic.     Mouth/Throat:     Mouth: Mucous membranes are moist.     Pharynx: Oropharynx is clear. No oropharyngeal exudate or posterior oropharyngeal erythema.  Eyes:     General: No scleral icterus.    Extraocular Movements: Extraocular  movements intact.     Conjunctiva/sclera: Conjunctivae normal.     Pupils: Pupils are equal, round, and reactive to light.  Cardiovascular:     Rate and Rhythm: Normal rate and regular rhythm.     Heart sounds: Normal heart sounds. No murmur heard.    No friction rub. No gallop.  Pulmonary:     Effort: Pulmonary effort is normal.     Breath sounds: Normal breath sounds. No wheezing, rhonchi or rales.  Abdominal:     General: There is no distension.     Palpations: Abdomen is soft. There is no hepatomegaly, splenomegaly or mass.     Tenderness: There is no abdominal tenderness.  Musculoskeletal:        General: Normal range of motion.     Cervical back: Normal range of motion and neck supple. No tenderness.     Right lower leg: No edema.     Left lower leg: No edema.  Lymphadenopathy:     Cervical: No cervical adenopathy.     Upper Body:     Right upper body: No supraclavicular or axillary adenopathy.     Left upper body: No supraclavicular or axillary adenopathy.     Lower Body: No right inguinal adenopathy. No left inguinal adenopathy.  Skin:    General: Skin is warm and dry.     Coloration: Skin is not jaundiced.     Findings: No rash.  Neurological:     Mental Status:  She is alert and oriented to person, place, and time.     Cranial Nerves: No cranial nerve deficit.  Psychiatric:        Mood and Affect: Mood normal.        Behavior: Behavior normal.        Thought Content: Thought content normal.     LABS:      Latest Ref Rng & Units 03/09/2024    2:30 PM 10/15/2023    2:09 PM 06/14/2023   12:00 AM  CBC  WBC 4.0 - 10.5 K/uL 3.6  3.1  2.9      Hemoglobin 12.0 - 15.0 g/dL 21.3  08.6  57.8      Hematocrit 36.0 - 46.0 % 32.1  32.7  33      Platelets 150 - 400 K/uL 113  110  112         This result is from an external source.      Latest Ref Rng & Units 03/09/2024    2:30 PM 10/15/2023    2:09 PM 06/14/2023   11:21 AM  CMP  Glucose 70 - 99 mg/dL 469  629  528    BUN 8 - 23 mg/dL 26  21  19    Creatinine 0.44 - 1.00 mg/dL 4.13  2.44  0.10   Sodium 135 - 145 mmol/L 142  142  137   Potassium 3.5 - 5.1 mmol/L 4.8  4.4  4.9   Chloride 98 - 111 mmol/L 106  104  104   CO2 22 - 32 mmol/L 26  26  27    Calcium 8.9 - 10.3 mg/dL 9.8  9.6  9.2   Total Protein 6.5 - 8.1 g/dL 6.5  6.7  6.4   Total Bilirubin 0.0 - 1.2 mg/dL 0.3  0.4  0.6   Alkaline Phos 38 - 126 U/L 97  92  91   AST 15 - 41 U/L 20  20  19    ALT 0 - 44 U/L 13  14  15      Lab Results  Component Value Date   TIBC 301 10/15/2023   TIBC 279 06/14/2023   TIBC 281 10/11/2022   FERRITIN 349 (H) 10/15/2023   FERRITIN 381 (H) 06/14/2023   FERRITIN 572 (H) 10/11/2022   IRONPCTSAT 18 10/15/2023   IRONPCTSAT 27 06/14/2023   IRONPCTSAT 27 10/11/2022   Lab Results  Component Value Date   LDH 195 (H) 03/09/2024   LDH 207 (H) 10/15/2023   LDH 148 06/14/2023       Component Value Date/Time   LDH 195 (H) 03/09/2024 1430    Review Flowsheet  More data exists      Latest Ref Rng & Units 06/14/2023 10/15/2023 03/09/2024  Oncology Labs  Ferritin 11 - 307 ng/mL 381  349  -  %SAT 10.4 - 31.8 % 27  18  -  LDH 98 - 192 U/L 148  207  195      STUDIES:  No results found.    ASSESSMENT & PLAN:   Assessment/Plan:  78 y.o. female with stage IIIA classical Hodgkin lymphoma, nodular sclerosis subtype. She has had mild chronic pancytopenia since receiving chemotherapy. She was found to have B12 and folate deficiency, but the pancytopenia persists despite replacing these vitamins. She remains without evidence of disease.  Clinically, the patient continues to do fairly well.   She knows to continue taking her monthly B12 shots and folate as previously prescribed.  Otherwise, I will see  her back in 4 months for her continued Hodgkin lymphoma surveillance.   The patient understands all the plans discussed today and is in agreement with them.  She knows to contact our office if she develops concerns prior to  her next appointment.     Alfonso Ike, PA-C   Physician Assistant [facility] [phone]

## 2024-03-09 ENCOUNTER — Encounter: Payer: Self-pay | Admitting: Hematology and Oncology

## 2024-03-09 ENCOUNTER — Inpatient Hospital Stay: Payer: Medicare HMO | Attending: Oncology

## 2024-03-09 ENCOUNTER — Telehealth: Payer: Self-pay | Admitting: Hematology and Oncology

## 2024-03-09 ENCOUNTER — Inpatient Hospital Stay

## 2024-03-09 ENCOUNTER — Inpatient Hospital Stay: Admitting: Hematology and Oncology

## 2024-03-09 VITALS — BP 163/78 | HR 86 | Temp 98.1°F | Resp 20 | Ht 62.5 in | Wt 134.8 lb

## 2024-03-09 DIAGNOSIS — E538 Deficiency of other specified B group vitamins: Secondary | ICD-10-CM | POA: Diagnosis not present

## 2024-03-09 DIAGNOSIS — D61818 Other pancytopenia: Secondary | ICD-10-CM

## 2024-03-09 DIAGNOSIS — C8110 Nodular sclerosis classical Hodgkin lymphoma, unspecified site: Secondary | ICD-10-CM

## 2024-03-09 LAB — CBC WITH DIFFERENTIAL (CANCER CENTER ONLY)
Abs Immature Granulocytes: 0.01 10*3/uL (ref 0.00–0.07)
Basophils Absolute: 0 10*3/uL (ref 0.0–0.1)
Basophils Relative: 1 %
Eosinophils Absolute: 0 10*3/uL (ref 0.0–0.5)
Eosinophils Relative: 1 %
HCT: 32.1 % — ABNORMAL LOW (ref 36.0–46.0)
Hemoglobin: 10.8 g/dL — ABNORMAL LOW (ref 12.0–15.0)
Immature Granulocytes: 0 %
Lymphocytes Relative: 24 %
Lymphs Abs: 0.9 10*3/uL (ref 0.7–4.0)
MCH: 34.2 pg — ABNORMAL HIGH (ref 26.0–34.0)
MCHC: 33.6 g/dL (ref 30.0–36.0)
MCV: 101.6 fL — ABNORMAL HIGH (ref 80.0–100.0)
Monocytes Absolute: 0.4 10*3/uL (ref 0.1–1.0)
Monocytes Relative: 11 %
Neutro Abs: 2.3 10*3/uL (ref 1.7–7.7)
Neutrophils Relative %: 63 %
Platelet Count: 113 10*3/uL — ABNORMAL LOW (ref 150–400)
RBC: 3.16 MIL/uL — ABNORMAL LOW (ref 3.87–5.11)
RDW: 12.8 % (ref 11.5–15.5)
WBC Count: 3.6 10*3/uL — ABNORMAL LOW (ref 4.0–10.5)
nRBC: 0 % (ref 0.0–0.2)
nRBC: 0 /100{WBCs}

## 2024-03-09 LAB — CMP (CANCER CENTER ONLY)
ALT: 13 U/L (ref 0–44)
AST: 20 U/L (ref 15–41)
Albumin: 4.6 g/dL (ref 3.5–5.0)
Alkaline Phosphatase: 97 U/L (ref 38–126)
Anion gap: 9 (ref 5–15)
BUN: 26 mg/dL — ABNORMAL HIGH (ref 8–23)
CO2: 26 mmol/L (ref 22–32)
Calcium: 9.8 mg/dL (ref 8.9–10.3)
Chloride: 106 mmol/L (ref 98–111)
Creatinine: 1.15 mg/dL — ABNORMAL HIGH (ref 0.44–1.00)
GFR, Estimated: 49 mL/min — ABNORMAL LOW (ref >60)
Glucose, Bld: 129 mg/dL — ABNORMAL HIGH (ref 70–99)
Potassium: 4.8 mmol/L (ref 3.5–5.1)
Sodium: 142 mmol/L (ref 135–145)
Total Bilirubin: 0.3 mg/dL (ref 0.0–1.2)
Total Protein: 6.5 g/dL (ref 6.5–8.1)

## 2024-03-09 LAB — SEDIMENTATION RATE: Sed Rate: 30 mm/h — ABNORMAL HIGH (ref 0–22)

## 2024-03-09 LAB — LACTATE DEHYDROGENASE: LDH: 195 U/L — ABNORMAL HIGH (ref 98–192)

## 2024-03-09 MED ORDER — SODIUM CHLORIDE 0.9% FLUSH
10.0000 mL | INTRAVENOUS | Status: DC | PRN
  Administered 2024-03-09: 10 mL

## 2024-03-09 MED ORDER — HEPARIN SOD (PORK) LOCK FLUSH 100 UNIT/ML IV SOLN
500.0000 [IU] | Freq: Once | INTRAVENOUS | Status: AC | PRN
  Administered 2024-03-09: 500 [IU]

## 2024-03-09 MED ORDER — CYANOCOBALAMIN 1000 MCG/ML IJ SOLN
1000.0000 ug | Freq: Once | INTRAMUSCULAR | Status: AC
  Administered 2024-03-09: 1000 ug via INTRAMUSCULAR
  Filled 2024-03-09: qty 1

## 2024-03-09 MED ORDER — LIDOCAINE-PRILOCAINE 2.5-2.5 % EX CREA: 1.0000 | TOPICAL_CREAM | CUTANEOUS | 0 refills | Status: AC | PRN

## 2024-03-09 NOTE — Patient Instructions (Signed)
 Vitamin B12 Injection What is this medication? Vitamin B12 (VAHY tuh min B12) prevents and treats low vitamin B12 levels in your body. It is used in people who do not get enough vitamin B12 from their diet or when their digestive tract does not absorb enough. Vitamin B12 plays an important role in maintaining the health of your nervous system and red blood cells. This medicine may be used for other purposes; ask your health care provider or pharmacist if you have questions. COMMON BRAND NAME(S): B-12 Compliance Kit, B-12 Injection Kit, Cyomin, Dodex, LA-12, Nutri-Twelve, Physicians EZ Use B-12, Primabalt, Vitamin Deficiency Injectable System - B12 What should I tell my care team before I take this medication? They need to know if you have any of these conditions: Kidney disease Leber's disease Megaloblastic anemia An unusual or allergic reaction to cyanocobalamin, cobalt, other medications, foods, dyes, or preservatives Pregnant or trying to get pregnant Breast-feeding How should I use this medication? This medication is injected into a muscle or deeply under the skin. It is usually given in a clinic or care team's office. However, your care team may teach you how to inject yourself. Follow all instructions. Talk to your care team about the use of this medication in children. Special care may be needed. Overdosage: If you think you have taken too much of this medicine contact a poison control center or emergency room at once. NOTE: This medicine is only for you. Do not share this medicine with others. What if I miss a dose? If you are given your dose at a clinic or care team's office, call to reschedule your appointment. If you give your own injections, and you miss a dose, take it as soon as you can. If it is almost time for your next dose, take only that dose. Do not take double or extra doses. What may interact with this medication? Alcohol Colchicine This list may not describe all possible  interactions. Give your health care provider a list of all the medicines, herbs, non-prescription drugs, or dietary supplements you use. Also tell them if you smoke, drink alcohol, or use illegal drugs. Some items may interact with your medicine. What should I watch for while using this medication? Visit your care team regularly. You may need blood work done while you are taking this medication. You may need to follow a special diet. Talk to your care team. Limit your alcohol intake and avoid smoking to get the best benefit. What side effects may I notice from receiving this medication? Side effects that you should report to your care team as soon as possible: Allergic reactions--skin rash, itching, hives, swelling of the face, lips, tongue, or throat Swelling of the ankles, hands, or feet Trouble breathing Side effects that usually do not require medical attention (report to your care team if they continue or are bothersome): Diarrhea This list may not describe all possible side effects. Call your doctor for medical advice about side effects. You may report side effects to FDA at 1-800-FDA-1088. Where should I keep my medication? Keep out of the reach of children. Store at room temperature between 15 and 30 degrees C (59 and 85 degrees F). Protect from light. Throw away any unused medication after the expiration date. NOTE: This sheet is a summary. It may not cover all possible information. If you have questions about this medicine, talk to your doctor, pharmacist, or health care provider.  2024 Elsevier/Gold Standard (2021-07-25 00:00:00)

## 2024-03-09 NOTE — Telephone Encounter (Signed)
 Patient has been scheduled for follow-up visit per 03/09/24 LOS.  Pt given an appt calendar with date and time.

## 2024-03-10 LAB — VITAMIN D 25 HYDROXY (VIT D DEFICIENCY, FRACTURES): Vit D, 25-Hydroxy: 11.17 ng/mL — ABNORMAL LOW (ref 30–100)

## 2024-03-10 LAB — MAGNESIUM: Magnesium: 1.9 mg/dL (ref 1.7–2.4)

## 2024-03-11 ENCOUNTER — Telehealth: Payer: Self-pay

## 2024-03-11 NOTE — Telephone Encounter (Signed)
 Patient notified and voiced understanding.

## 2024-03-20 DIAGNOSIS — H02889 Meibomian gland dysfunction of unspecified eye, unspecified eyelid: Secondary | ICD-10-CM | POA: Diagnosis not present

## 2024-03-20 DIAGNOSIS — H04123 Dry eye syndrome of bilateral lacrimal glands: Secondary | ICD-10-CM | POA: Diagnosis not present

## 2024-04-06 DIAGNOSIS — R300 Dysuria: Secondary | ICD-10-CM | POA: Diagnosis not present

## 2024-04-06 DIAGNOSIS — Z136 Encounter for screening for cardiovascular disorders: Secondary | ICD-10-CM | POA: Diagnosis not present

## 2024-04-06 DIAGNOSIS — Z0000 Encounter for general adult medical examination without abnormal findings: Secondary | ICD-10-CM | POA: Diagnosis not present

## 2024-04-06 DIAGNOSIS — K219 Gastro-esophageal reflux disease without esophagitis: Secondary | ICD-10-CM | POA: Diagnosis not present

## 2024-04-06 DIAGNOSIS — R809 Proteinuria, unspecified: Secondary | ICD-10-CM | POA: Diagnosis not present

## 2024-04-06 DIAGNOSIS — E1129 Type 2 diabetes mellitus with other diabetic kidney complication: Secondary | ICD-10-CM | POA: Diagnosis not present

## 2024-04-06 DIAGNOSIS — C8114 Nodular sclerosis classical Hodgkin lymphoma, lymph nodes of axilla and upper limb: Secondary | ICD-10-CM | POA: Diagnosis not present

## 2024-04-06 DIAGNOSIS — Z780 Asymptomatic menopausal state: Secondary | ICD-10-CM | POA: Diagnosis not present

## 2024-04-06 DIAGNOSIS — N1831 Chronic kidney disease, stage 3a: Secondary | ICD-10-CM | POA: Diagnosis not present

## 2024-04-06 DIAGNOSIS — M0579 Rheumatoid arthritis with rheumatoid factor of multiple sites without organ or systems involvement: Secondary | ICD-10-CM | POA: Diagnosis not present

## 2024-04-06 DIAGNOSIS — Z1331 Encounter for screening for depression: Secondary | ICD-10-CM | POA: Diagnosis not present

## 2024-04-06 DIAGNOSIS — Z1339 Encounter for screening examination for other mental health and behavioral disorders: Secondary | ICD-10-CM | POA: Diagnosis not present

## 2024-04-06 LAB — HM DEXA SCAN

## 2024-04-13 ENCOUNTER — Inpatient Hospital Stay: Attending: Oncology

## 2024-04-13 VITALS — BP 155/82 | HR 89 | Temp 97.5°F | Resp 20

## 2024-04-13 DIAGNOSIS — E538 Deficiency of other specified B group vitamins: Secondary | ICD-10-CM | POA: Diagnosis not present

## 2024-04-13 MED ORDER — CYANOCOBALAMIN 1000 MCG/ML IJ SOLN
1000.0000 ug | Freq: Once | INTRAMUSCULAR | Status: AC
  Administered 2024-04-13: 1000 ug via INTRAMUSCULAR
  Filled 2024-04-13: qty 1

## 2024-04-13 NOTE — Patient Instructions (Signed)
 Vitamin B12 Injection What is this medication? Vitamin B12 (VAHY tuh min B12) prevents and treats low vitamin B12 levels in your body. It is used in people who do not get enough vitamin B12 from their diet or when their digestive tract does not absorb enough. Vitamin B12 plays an important role in maintaining the health of your nervous system and red blood cells. This medicine may be used for other purposes; ask your health care provider or pharmacist if you have questions. COMMON BRAND NAME(S): B-12 Compliance Kit, B-12 Injection Kit, Cyomin, Dodex, LA-12, Nutri-Twelve, Physicians EZ Use B-12, Primabalt, Vitamin Deficiency Injectable System - B12 What should I tell my care team before I take this medication? They need to know if you have any of these conditions: Kidney disease Leber's disease Megaloblastic anemia An unusual or allergic reaction to cyanocobalamin, cobalt, other medications, foods, dyes, or preservatives Pregnant or trying to get pregnant Breast-feeding How should I use this medication? This medication is injected into a muscle or deeply under the skin. It is usually given in a clinic or care team's office. However, your care team may teach you how to inject yourself. Follow all instructions. Talk to your care team about the use of this medication in children. Special care may be needed. Overdosage: If you think you have taken too much of this medicine contact a poison control center or emergency room at once. NOTE: This medicine is only for you. Do not share this medicine with others. What if I miss a dose? If you are given your dose at a clinic or care team's office, call to reschedule your appointment. If you give your own injections, and you miss a dose, take it as soon as you can. If it is almost time for your next dose, take only that dose. Do not take double or extra doses. What may interact with this medication? Alcohol Colchicine This list may not describe all possible  interactions. Give your health care provider a list of all the medicines, herbs, non-prescription drugs, or dietary supplements you use. Also tell them if you smoke, drink alcohol, or use illegal drugs. Some items may interact with your medicine. What should I watch for while using this medication? Visit your care team regularly. You may need blood work done while you are taking this medication. You may need to follow a special diet. Talk to your care team. Limit your alcohol intake and avoid smoking to get the best benefit. What side effects may I notice from receiving this medication? Side effects that you should report to your care team as soon as possible: Allergic reactions--skin rash, itching, hives, swelling of the face, lips, tongue, or throat Swelling of the ankles, hands, or feet Trouble breathing Side effects that usually do not require medical attention (report to your care team if they continue or are bothersome): Diarrhea This list may not describe all possible side effects. Call your doctor for medical advice about side effects. You may report side effects to FDA at 1-800-FDA-1088. Where should I keep my medication? Keep out of the reach of children. Store at room temperature between 15 and 30 degrees C (59 and 85 degrees F). Protect from light. Throw away any unused medication after the expiration date. NOTE: This sheet is a summary. It may not cover all possible information. If you have questions about this medicine, talk to your doctor, pharmacist, or health care provider.  2024 Elsevier/Gold Standard (2021-07-25 00:00:00)

## 2024-04-16 ENCOUNTER — Telehealth: Payer: Self-pay

## 2024-04-16 NOTE — Telephone Encounter (Signed)
-----   Message from Alfonso Ike sent at 04/16/2024  8:34 AM EDT ----- Please have her talk to her PCP about this. Her vitamin D  level was very low and may be the cause of her fatigue. I don't know if a different formula of vitamin D  might be better tolerated. Thanks ----- Message ----- From: Winda Hastings, RN Sent: 04/13/2024   4:01 PM EDT To: Alfonso Ike, PA-C  Patient states that she has tried taking the Vitamin D3- every other day. States she has not been able to tolerate it- States that it has caused her to have "sharp" pains throughout her chest and abd- States that these pains resolved when she stopped the D3- Denies having any pain today- I am not really sure how to advise her on this one- but I told her I would let you know and that we would follow up with her.  Thanks Edwina Gram

## 2024-04-16 NOTE — Telephone Encounter (Signed)
 Detailed message left for patient.

## 2024-04-29 DIAGNOSIS — H6692 Otitis media, unspecified, left ear: Secondary | ICD-10-CM | POA: Diagnosis not present

## 2024-05-11 ENCOUNTER — Inpatient Hospital Stay: Attending: Oncology

## 2024-05-11 VITALS — BP 142/61 | HR 86 | Temp 98.0°F | Resp 18

## 2024-05-11 DIAGNOSIS — E538 Deficiency of other specified B group vitamins: Secondary | ICD-10-CM | POA: Diagnosis not present

## 2024-05-11 MED ORDER — CYANOCOBALAMIN 1000 MCG/ML IJ SOLN
1000.0000 ug | Freq: Once | INTRAMUSCULAR | Status: AC
  Administered 2024-05-11: 1000 ug via INTRAMUSCULAR
  Filled 2024-05-11: qty 1

## 2024-05-15 DIAGNOSIS — H04123 Dry eye syndrome of bilateral lacrimal glands: Secondary | ICD-10-CM | POA: Diagnosis not present

## 2024-05-15 DIAGNOSIS — H02889 Meibomian gland dysfunction of unspecified eye, unspecified eyelid: Secondary | ICD-10-CM | POA: Diagnosis not present

## 2024-06-08 ENCOUNTER — Inpatient Hospital Stay: Attending: Oncology

## 2024-06-08 VITALS — BP 116/90 | HR 88 | Temp 97.2°F | Resp 18

## 2024-06-08 DIAGNOSIS — E538 Deficiency of other specified B group vitamins: Secondary | ICD-10-CM | POA: Diagnosis not present

## 2024-06-08 MED ORDER — CYANOCOBALAMIN 1000 MCG/ML IJ SOLN
1000.0000 ug | Freq: Once | INTRAMUSCULAR | Status: AC
  Administered 2024-06-08: 1000 ug via INTRAMUSCULAR
  Filled 2024-06-08: qty 1

## 2024-06-08 NOTE — Patient Instructions (Signed)
 Vitamin B12 Injection What is this medication? Vitamin B12 (VAHY tuh min B12) prevents and treats low vitamin B12 levels in your body. It is used in people who do not get enough vitamin B12 from their diet or when their digestive tract does not absorb enough. Vitamin B12 plays an important role in maintaining the health of your nervous system and red blood cells. This medicine may be used for other purposes; ask your health care provider or pharmacist if you have questions. COMMON BRAND NAME(S): B-12 Compliance Kit, B-12 Injection Kit, Cyomin, Dodex, LA-12, Nutri-Twelve, Physicians EZ Use B-12, Primabalt, Vitamin Deficiency Injectable System - B12 What should I tell my care team before I take this medication? They need to know if you have any of these conditions: Kidney disease Leber's disease Megaloblastic anemia An unusual or allergic reaction to cyanocobalamin, cobalt, other medications, foods, dyes, or preservatives Pregnant or trying to get pregnant Breast-feeding How should I use this medication? This medication is injected into a muscle or deeply under the skin. It is usually given in a clinic or care team's office. However, your care team may teach you how to inject yourself. Follow all instructions. Talk to your care team about the use of this medication in children. Special care may be needed. Overdosage: If you think you have taken too much of this medicine contact a poison control center or emergency room at once. NOTE: This medicine is only for you. Do not share this medicine with others. What if I miss a dose? If you are given your dose at a clinic or care team's office, call to reschedule your appointment. If you give your own injections, and you miss a dose, take it as soon as you can. If it is almost time for your next dose, take only that dose. Do not take double or extra doses. What may interact with this medication? Alcohol Colchicine This list may not describe all possible  interactions. Give your health care provider a list of all the medicines, herbs, non-prescription drugs, or dietary supplements you use. Also tell them if you smoke, drink alcohol, or use illegal drugs. Some items may interact with your medicine. What should I watch for while using this medication? Visit your care team regularly. You may need blood work done while you are taking this medication. You may need to follow a special diet. Talk to your care team. Limit your alcohol intake and avoid smoking to get the best benefit. What side effects may I notice from receiving this medication? Side effects that you should report to your care team as soon as possible: Allergic reactions--skin rash, itching, hives, swelling of the face, lips, tongue, or throat Swelling of the ankles, hands, or feet Trouble breathing Side effects that usually do not require medical attention (report to your care team if they continue or are bothersome): Diarrhea This list may not describe all possible side effects. Call your doctor for medical advice about side effects. You may report side effects to FDA at 1-800-FDA-1088. Where should I keep my medication? Keep out of the reach of children. Store at room temperature between 15 and 30 degrees C (59 and 85 degrees F). Protect from light. Throw away any unused medication after the expiration date. NOTE: This sheet is a summary. It may not cover all possible information. If you have questions about this medicine, talk to your doctor, pharmacist, or health care provider.  2024 Elsevier/Gold Standard (2021-07-25 00:00:00)

## 2024-07-09 ENCOUNTER — Inpatient Hospital Stay

## 2024-07-09 ENCOUNTER — Inpatient Hospital Stay: Attending: Oncology | Admitting: Hematology and Oncology

## 2024-07-09 VITALS — BP 146/71 | HR 89 | Temp 98.2°F | Resp 18 | Ht 62.5 in

## 2024-07-09 DIAGNOSIS — E538 Deficiency of other specified B group vitamins: Secondary | ICD-10-CM | POA: Diagnosis not present

## 2024-07-09 DIAGNOSIS — Z862 Personal history of diseases of the blood and blood-forming organs and certain disorders involving the immune mechanism: Secondary | ICD-10-CM | POA: Diagnosis not present

## 2024-07-09 DIAGNOSIS — R5383 Other fatigue: Secondary | ICD-10-CM | POA: Diagnosis not present

## 2024-07-09 DIAGNOSIS — G629 Polyneuropathy, unspecified: Secondary | ICD-10-CM | POA: Diagnosis not present

## 2024-07-09 LAB — CBC WITH DIFFERENTIAL (CANCER CENTER ONLY)
Abs Immature Granulocytes: 0.01 K/uL (ref 0.00–0.07)
Basophils Absolute: 0 K/uL (ref 0.0–0.1)
Basophils Relative: 1 %
Eosinophils Absolute: 0.1 K/uL (ref 0.0–0.5)
Eosinophils Relative: 2 %
HCT: 32.2 % — ABNORMAL LOW (ref 36.0–46.0)
Hemoglobin: 10.9 g/dL — ABNORMAL LOW (ref 12.0–15.0)
Immature Granulocytes: 0 %
Lymphocytes Relative: 30 %
Lymphs Abs: 0.9 K/uL (ref 0.7–4.0)
MCH: 33.5 pg (ref 26.0–34.0)
MCHC: 33.9 g/dL (ref 30.0–36.0)
MCV: 99.1 fL (ref 80.0–100.0)
Monocytes Absolute: 0.3 K/uL (ref 0.1–1.0)
Monocytes Relative: 11 %
Neutro Abs: 1.7 K/uL (ref 1.7–7.7)
Neutrophils Relative %: 56 %
Platelet Count: 119 K/uL — ABNORMAL LOW (ref 150–400)
RBC: 3.25 MIL/uL — ABNORMAL LOW (ref 3.87–5.11)
RDW: 12.9 % (ref 11.5–15.5)
WBC Count: 3 K/uL — ABNORMAL LOW (ref 4.0–10.5)
nRBC: 0 % (ref 0.0–0.2)

## 2024-07-09 LAB — CMP (CANCER CENTER ONLY)
ALT: 18 U/L (ref 0–44)
AST: 25 U/L (ref 15–41)
Albumin: 4.4 g/dL (ref 3.5–5.0)
Alkaline Phosphatase: 92 U/L (ref 38–126)
Anion gap: 12 (ref 5–15)
BUN: 27 mg/dL — ABNORMAL HIGH (ref 8–23)
CO2: 24 mmol/L (ref 22–32)
Calcium: 9.6 mg/dL (ref 8.9–10.3)
Chloride: 104 mmol/L (ref 98–111)
Creatinine: 1.09 mg/dL — ABNORMAL HIGH (ref 0.44–1.00)
GFR, Estimated: 52 mL/min — ABNORMAL LOW (ref >60)
Glucose, Bld: 166 mg/dL — ABNORMAL HIGH (ref 70–99)
Potassium: 4.5 mmol/L (ref 3.5–5.1)
Sodium: 139 mmol/L (ref 135–145)
Total Bilirubin: 0.4 mg/dL (ref 0.0–1.2)
Total Protein: 6.7 g/dL (ref 6.5–8.1)

## 2024-07-09 LAB — VITAMIN B12: Vitamin B-12: 521 pg/mL (ref 180–914)

## 2024-07-09 LAB — FERRITIN: Ferritin: 396 ng/mL — ABNORMAL HIGH (ref 11–307)

## 2024-07-09 MED ORDER — CYANOCOBALAMIN 1000 MCG/ML IJ SOLN
1000.0000 ug | Freq: Once | INTRAMUSCULAR | Status: AC
  Administered 2024-07-09: 1000 ug via INTRAMUSCULAR
  Filled 2024-07-09: qty 1

## 2024-07-09 NOTE — Progress Notes (Unsigned)
 [facility]  [address] [place],  [place]  [address] [phone]  Clinic Day:  07/16/2024  Referring physician: Clemmie Nest, MD   HISTORY OF PRESENT ILLNESS:  The patient is a 78 y.o. 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  She completed 2 cycles of brentuximab vedotin  before completing 5 of 6 planned cycles of Adriamycin /vinblastine /dacarbazine  (AVD) chemotherapy in September 2023.  Due to her extreme fatigue and weakness, she did not receive her sixth and final cycle of AVD chemotherapy.  PET scan done after her therapy showed her to have had a complete response.  CT chest, abdomen and pelvis, as well as MRI brain in December did not reveal any radiographic evidence of disease recurrence.  Since her last visit, the patient has been doing fairly well.  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 She also reports continued tinnitus and vertigo. She otherwise denies having any B symptoms which concern her for overt signs of disease recurrence.     PHYSICAL EXAM:  There were no vitals taken for this visit. Wt Readings from Last 3 Encounters:  03/09/24 134 lb 12.8 oz (61.1 kg)  10/30/23 132 lb 8 oz (60.1 kg)  10/15/23 130 lb 11.2 oz (59.3 kg)   There is no height or weight on file to calculate BMI.  Performance status (ECOG): 1 - Symptomatic but completely ambulatory  Physical Exam Vitals and nursing note reviewed.  Constitutional:      General: She is not in acute distress.    Appearance: Normal appearance.  HENT:     Head: Normocephalic and atraumatic.     Mouth/Throat:     Mouth: Mucous membranes are moist.     Pharynx: Oropharynx is clear. No oropharyngeal exudate or posterior oropharyngeal erythema.  Eyes:     General: No scleral icterus.    Extraocular Movements: Extraocular movements intact.     Conjunctiva/sclera: Conjunctivae normal.     Pupils: Pupils are equal, round, and reactive to  light.  Cardiovascular:     Rate and Rhythm: Normal rate and regular rhythm.     Heart sounds: Normal heart sounds. No murmur heard.    No friction rub. No gallop.  Pulmonary:     Effort: Pulmonary effort is normal.     Breath sounds: Normal breath sounds. No wheezing, rhonchi or rales.  Abdominal:     General: There is no distension.     Palpations: Abdomen is soft. There is no hepatomegaly, splenomegaly or mass.     Tenderness: There is no abdominal tenderness.  Musculoskeletal:        General: Normal range of motion.     Cervical back: Normal range of motion and neck supple. No tenderness.     Right lower leg: No edema.     Left lower leg: No edema.  Lymphadenopathy:     Cervical: No cervical adenopathy.     Upper Body:     Right upper body: No supraclavicular or axillary adenopathy.     Left upper body: No supraclavicular or axillary adenopathy.     Lower Body: No right inguinal adenopathy. No left inguinal adenopathy.  Skin:    General: Skin is warm and dry.     Coloration: Skin is not jaundiced.     Findings: No rash.  Neurological:     Mental Status: She is alert and oriented to person, place, and time.     Cranial Nerves: No cranial  nerve deficit.  Psychiatric:        Mood and Affect: Mood normal.        Behavior: Behavior normal.        Thought Content: Thought content normal.     LABS:      Latest Ref Rng & Units 07/09/2024    2:44 PM 03/09/2024    2:30 PM 10/15/2023    2:09 PM  CBC  WBC 4.0 - 10.5 K/uL 3.0  3.6  3.1   Hemoglobin 12.0 - 15.0 g/dL 89.0  89.1  89.0   Hematocrit 36.0 - 46.0 % 32.2  32.1  32.7   Platelets 150 - 400 K/uL 119  113  110       Latest Ref Rng & Units 07/09/2024    2:44 PM 03/09/2024    2:30 PM 10/15/2023    2:09 PM  CMP  Glucose 70 - 99 mg/dL 833  870  861   BUN 8 - 23 mg/dL 27  26  21    Creatinine 0.44 - 1.00 mg/dL 8.90  8.84  9.00   Sodium 135 - 145 mmol/L 139  142  142   Potassium 3.5 - 5.1 mmol/L 4.5  4.8  4.4   Chloride  98 - 111 mmol/L 104  106  104   CO2 22 - 32 mmol/L 24  26  26    Calcium 8.9 - 10.3 mg/dL 9.6  9.8  9.6   Total Protein 6.5 - 8.1 g/dL 6.7  6.5  6.7   Total Bilirubin 0.0 - 1.2 mg/dL 0.4  0.3  0.4   Alkaline Phos 38 - 126 U/L 92  97  92   AST 15 - 41 U/L 25  20  20    ALT 0 - 44 U/L 18  13  14      Lab Results  Component Value Date   TIBC 302 07/09/2024   TIBC 301 10/15/2023   TIBC 279 06/14/2023   FERRITIN 396 (H) 07/09/2024   FERRITIN 349 (H) 10/15/2023   FERRITIN 381 (H) 06/14/2023   IRONPCTSAT 21 07/09/2024   IRONPCTSAT 18 10/15/2023   IRONPCTSAT 27 06/14/2023   Lab Results  Component Value Date   LDH 195 (H) 03/09/2024   LDH 207 (H) 10/15/2023   LDH 148 06/14/2023       Component Value Date/Time   LDH 195 (H) 03/09/2024 1430    Review Flowsheet  More data exists      Latest Ref Rng & Units 10/15/2023 03/09/2024 07/09/2024  Oncology Labs  Ferritin 11 - 307 ng/mL 349  - 396   %SAT 10.4 - 31.8 % 18  - 21   LDH 98 - 192 U/L 207  195  -     STUDIES:  No results found.    ASSESSMENT & PLAN:   Assessment/Plan:  78 y.o. female with stage IIIA classical Hodgkin lymphoma, nodular sclerosis subtype. She has had mild chronic pancytopenia since receiving chemotherapy. She was found to have B12 and folate deficiency, but the pancytopenia persists despite replacing these vitamins. She remains without evidence of disease.  Clinically, the patient continues to do fairly well.   She knows to continue taking her monthly B12 shots and folate as previously prescribed.  Otherwise, I will see her back in 4 months for her continued Hodgkin lymphoma surveillance.   The patient understands all the plans discussed today and is in agreement with them.  She knows to contact our office if she develops concerns prior to  her next appointment.     Eleanor DELENA Bach, NP   Family Nurse Practitioner - Board Certified [facility] [phone]

## 2024-07-10 ENCOUNTER — Telehealth: Payer: Self-pay | Admitting: Oncology

## 2024-07-10 LAB — IRON AND TIBC
Iron: 62 ug/dL (ref 28–170)
Saturation Ratios: 21 % (ref 10.4–31.8)
TIBC: 302 ug/dL (ref 250–450)
UIBC: 240 ug/dL

## 2024-07-10 NOTE — Telephone Encounter (Signed)
 Patient has been scheduled for follow-up visit per 07/09/24 LOS.  Pt aware of scheduled appt details.

## 2024-07-16 ENCOUNTER — Encounter: Payer: Self-pay | Admitting: Oncology

## 2024-07-20 LAB — LAB REPORT - SCANNED
A1c: 6.1
EGFR: 60

## 2024-07-22 ENCOUNTER — Telehealth: Payer: Self-pay

## 2024-07-22 NOTE — Telephone Encounter (Signed)
 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  NEED TO KNOW WHICH THE PATIENT PREFERS PER DR. EZZARD.

## 2024-07-23 DIAGNOSIS — Z0100 Encounter for examination of eyes and vision without abnormal findings: Secondary | ICD-10-CM | POA: Diagnosis not present

## 2024-07-23 DIAGNOSIS — E119 Type 2 diabetes mellitus without complications: Secondary | ICD-10-CM | POA: Diagnosis not present

## 2024-07-23 DIAGNOSIS — Z79899 Other long term (current) drug therapy: Secondary | ICD-10-CM | POA: Diagnosis not present

## 2024-07-23 DIAGNOSIS — M0609 Rheumatoid arthritis without rheumatoid factor, multiple sites: Secondary | ICD-10-CM | POA: Diagnosis not present

## 2024-07-24 ENCOUNTER — Encounter: Payer: Self-pay | Admitting: Oncology

## 2024-07-24 NOTE — Telephone Encounter (Signed)
 SPOKE WITH PATIENT AND SHE HAS SOME QUESTIONS ABOUT PROLIA VERSUS ALENDRONATE THAT SHE WANTS TO TALK TO DR. EZZARD ABOUT ON HER NEXT VISIT.  SHE WILL MAKE A DECISION AT THAT TIME.

## 2024-08-10 ENCOUNTER — Other Ambulatory Visit: Payer: Self-pay

## 2024-08-10 ENCOUNTER — Inpatient Hospital Stay

## 2024-08-10 ENCOUNTER — Other Ambulatory Visit: Payer: Self-pay | Admitting: Hematology and Oncology

## 2024-08-10 ENCOUNTER — Inpatient Hospital Stay: Admitting: Hematology and Oncology

## 2024-08-10 ENCOUNTER — Inpatient Hospital Stay: Attending: Oncology

## 2024-08-10 ENCOUNTER — Inpatient Hospital Stay: Admitting: Oncology

## 2024-08-10 ENCOUNTER — Ambulatory Visit

## 2024-08-10 VITALS — BP 144/70 | HR 78 | Temp 98.0°F | Resp 18 | Ht 62.5 in | Wt 137.1 lb

## 2024-08-10 DIAGNOSIS — Z79899 Other long term (current) drug therapy: Secondary | ICD-10-CM | POA: Diagnosis not present

## 2024-08-10 DIAGNOSIS — C8110 Nodular sclerosis classical Hodgkin lymphoma, unspecified site: Secondary | ICD-10-CM | POA: Diagnosis not present

## 2024-08-10 DIAGNOSIS — E538 Deficiency of other specified B group vitamins: Secondary | ICD-10-CM

## 2024-08-10 DIAGNOSIS — C8128 Mixed cellularity classical Hodgkin lymphoma, lymph nodes of multiple sites: Secondary | ICD-10-CM

## 2024-08-10 LAB — CBC WITH DIFFERENTIAL (CANCER CENTER ONLY)
Abs Immature Granulocytes: 0.01 K/uL (ref 0.00–0.07)
Basophils Absolute: 0 K/uL (ref 0.0–0.1)
Basophils Relative: 1 %
Eosinophils Absolute: 0.1 K/uL (ref 0.0–0.5)
Eosinophils Relative: 2 %
HCT: 32.1 % — ABNORMAL LOW (ref 36.0–46.0)
Hemoglobin: 10.5 g/dL — ABNORMAL LOW (ref 12.0–15.0)
Immature Granulocytes: 0 %
Lymphocytes Relative: 28 %
Lymphs Abs: 1 K/uL (ref 0.7–4.0)
MCH: 33 pg (ref 26.0–34.0)
MCHC: 32.7 g/dL (ref 30.0–36.0)
MCV: 100.9 fL — ABNORMAL HIGH (ref 80.0–100.0)
Monocytes Absolute: 0.4 K/uL (ref 0.1–1.0)
Monocytes Relative: 12 %
Neutro Abs: 2 K/uL (ref 1.7–7.7)
Neutrophils Relative %: 57 %
Platelet Count: 115 K/uL — ABNORMAL LOW (ref 150–400)
RBC: 3.18 MIL/uL — ABNORMAL LOW (ref 3.87–5.11)
RDW: 12.9 % (ref 11.5–15.5)
WBC Count: 3.6 K/uL — ABNORMAL LOW (ref 4.0–10.5)
nRBC: 0 % (ref 0.0–0.2)

## 2024-08-10 LAB — CMP (CANCER CENTER ONLY)
ALT: 11 U/L (ref 0–44)
AST: 22 U/L (ref 15–41)
Albumin: 4.2 g/dL (ref 3.5–5.0)
Alkaline Phosphatase: 81 U/L (ref 38–126)
Anion gap: 11 (ref 5–15)
BUN: 22 mg/dL (ref 8–23)
CO2: 24 mmol/L (ref 22–32)
Calcium: 9.6 mg/dL (ref 8.9–10.3)
Chloride: 105 mmol/L (ref 98–111)
Creatinine: 1.1 mg/dL — ABNORMAL HIGH (ref 0.44–1.00)
GFR, Estimated: 52 mL/min — ABNORMAL LOW (ref >60)
Glucose, Bld: 146 mg/dL — ABNORMAL HIGH (ref 70–99)
Potassium: 4.5 mmol/L (ref 3.5–5.1)
Sodium: 141 mmol/L (ref 135–145)
Total Bilirubin: 0.4 mg/dL (ref 0.0–1.2)
Total Protein: 6.8 g/dL (ref 6.5–8.1)

## 2024-08-10 LAB — VITAMIN B12: Vitamin B-12: 615 pg/mL (ref 180–914)

## 2024-08-10 LAB — IRON AND TIBC
Iron: 58 ug/dL (ref 28–170)
Saturation Ratios: 19 % (ref 10.4–31.8)
TIBC: 309 ug/dL (ref 250–450)
UIBC: 252 ug/dL

## 2024-08-10 LAB — FOLATE: Folate: 8.8 ng/mL (ref >5.9)

## 2024-08-10 LAB — FERRITIN: Ferritin: 427 ng/mL — ABNORMAL HIGH (ref 11–307)

## 2024-08-10 LAB — TSH: TSH: 0.456 u[IU]/mL (ref 0.350–4.500)

## 2024-08-10 MED ORDER — CYANOCOBALAMIN 1000 MCG/ML IJ SOLN
1000.0000 ug | Freq: Once | INTRAMUSCULAR | Status: AC
  Administered 2024-08-10: 1000 ug via INTRAMUSCULAR
  Filled 2024-08-10: qty 1

## 2024-08-10 MED ORDER — TRAMADOL HCL 50 MG PO TABS: 50.0000 mg | ORAL_TABLET | Freq: Four times a day (QID) | ORAL | 0 refills | Status: DC | PRN

## 2024-08-21 NOTE — Progress Notes (Signed)
 [facility]  [address] [place],  [place]  [address] [phone]  Clinic Day:  08/21/2024  Referring physician: Clemmie Nest, MD   HISTORY OF PRESENT ILLNESS:  The patient is a 78 y.o. 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  She completed 2 cycles of brentuximab vedotin  before completing 5 of 6 planned cycles of Adriamycin /vinblastine /dacarbazine  (AVD) chemotherapy in September 2023.  Due to her extreme fatigue and weakness, she did not receive her sixth and final cycle of AVD chemotherapy.  PET scan done after her therapy showed her to have had a complete response.  CT chest, abdomen and pelvis, as well as MRI brain in December did not reveal any radiographic evidence of disease recurrence.  Since her last visit, the patient has been doing fairly well.  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 She also reports continued tinnitus and vertigo. She otherwise denies having any B symptoms which concern her for overt signs of disease recurrence.     PHYSICAL EXAM:  Blood pressure (!) 144/70, pulse 78, temperature 98 F (36.7 C), temperature source Oral, resp. rate 18, height 5' 2.5 (1.588 m), weight 137 lb 1.6 oz (62.2 kg), SpO2 100%. Wt Readings from Last 3 Encounters:  08/10/24 137 lb 1.6 oz (62.2 kg)  03/09/24 134 lb 12.8 oz (61.1 kg)  10/30/23 132 lb 8 oz (60.1 kg)   Body mass index is 24.68 kg/m.  Performance status (ECOG): 1 - Symptomatic but completely ambulatory  Physical Exam Vitals and nursing note reviewed.  Constitutional:      General: She is not in acute distress.    Appearance: Normal appearance.  HENT:     Head: Normocephalic and atraumatic.     Mouth/Throat:     Mouth: Mucous membranes are moist.     Pharynx: Oropharynx is clear. No oropharyngeal exudate or posterior oropharyngeal erythema.  Eyes:     General: No scleral icterus.    Extraocular Movements: Extraocular movements  intact.     Conjunctiva/sclera: Conjunctivae normal.     Pupils: Pupils are equal, round, and reactive to light.  Cardiovascular:     Rate and Rhythm: Normal rate and regular rhythm.     Heart sounds: Normal heart sounds. No murmur heard.    No friction rub. No gallop.  Pulmonary:     Effort: Pulmonary effort is normal.     Breath sounds: Normal breath sounds. No wheezing, rhonchi or rales.  Abdominal:     General: There is no distension.     Palpations: Abdomen is soft. There is no hepatomegaly, splenomegaly or mass.     Tenderness: There is no abdominal tenderness.  Musculoskeletal:        General: Normal range of motion.     Cervical back: Normal range of motion and neck supple. No tenderness.     Right lower leg: No edema.     Left lower leg: No edema.  Lymphadenopathy:     Cervical: No cervical adenopathy.     Upper Body:     Right upper body: No supraclavicular or axillary adenopathy.     Left upper body: No supraclavicular or axillary adenopathy.     Lower Body: No right inguinal adenopathy. No left inguinal adenopathy.  Skin:    General: Skin is warm and dry.     Coloration: Skin is not jaundiced.     Findings: No rash.  Neurological:     Mental Status:  She is alert and oriented to person, place, and time.     Cranial Nerves: No cranial nerve deficit.  Psychiatric:        Mood and Affect: Mood normal.        Behavior: Behavior normal.        Thought Content: Thought content normal.     LABS:      Latest Ref Rng & Units 08/10/2024    2:32 PM 07/09/2024    2:44 PM 03/09/2024    2:30 PM  CBC  WBC 4.0 - 10.5 K/uL 3.6  3.0  3.6   Hemoglobin 12.0 - 15.0 g/dL 89.4  89.0  89.1   Hematocrit 36.0 - 46.0 % 32.1  32.2  32.1   Platelets 150 - 400 K/uL 115  119  113       Latest Ref Rng & Units 08/10/2024    2:32 PM 07/09/2024    2:44 PM 03/09/2024    2:30 PM  CMP  Glucose 70 - 99 mg/dL 853  833  870   BUN 8 - 23 mg/dL 22  27  26    Creatinine 0.44 - 1.00 mg/dL 8.89   8.90  8.84   Sodium 135 - 145 mmol/L 141  139  142   Potassium 3.5 - 5.1 mmol/L 4.5  4.5  4.8   Chloride 98 - 111 mmol/L 105  104  106   CO2 22 - 32 mmol/L 24  24  26    Calcium 8.9 - 10.3 mg/dL 9.6  9.6  9.8   Total Protein 6.5 - 8.1 g/dL 6.8  6.7  6.5   Total Bilirubin 0.0 - 1.2 mg/dL 0.4  0.4  0.3   Alkaline Phos 38 - 126 U/L 81  92  97   AST 15 - 41 U/L 22  25  20    ALT 0 - 44 U/L 11  18  13      Lab Results  Component Value Date   TIBC 309 08/10/2024   TIBC 302 07/09/2024   TIBC 301 10/15/2023   FERRITIN 427 (H) 08/10/2024   FERRITIN 396 (H) 07/09/2024   FERRITIN 349 (H) 10/15/2023   IRONPCTSAT 19 08/10/2024   IRONPCTSAT 21 07/09/2024   IRONPCTSAT 18 10/15/2023   Lab Results  Component Value Date   LDH 195 (H) 03/09/2024   LDH 207 (H) 10/15/2023   LDH 148 06/14/2023       Component Value Date/Time   LDH 195 (H) 03/09/2024 1430    Review Flowsheet  More data exists      Latest Ref Rng & Units 03/09/2024 07/09/2024 08/10/2024  Oncology Labs  Ferritin 11 - 307 ng/mL - 396  427   %SAT 10.4 - 31.8 % - 21  19   LDH 98 - 192 U/L 195  - -     STUDIES:  No results found.    ASSESSMENT & PLAN:   Assessment/Plan:  78 y.o. female with stage IIIA classical Hodgkin lymphoma, nodular sclerosis subtype. She has had mild chronic pancytopenia since receiving chemotherapy. She was found to have B12 and folate deficiency, but the pancytopenia persists despite replacing these vitamins. She remains without evidence of disease.  Clinically, the patient continues to do fairly well.   She knows to continue taking her monthly B12 shots and folate as previously prescribed.  Otherwise, I will see her back in 4 months for her continued Hodgkin lymphoma surveillance.   The patient understands all the plans discussed today and is  in agreement with them.  She knows to contact our office if she develops concerns prior to her next appointment.     Eleanor DELENA Bach, NP   Family Nurse  Practitioner - Board Certified [facility] [phone]

## 2024-09-07 ENCOUNTER — Inpatient Hospital Stay: Attending: Oncology

## 2024-09-07 VITALS — BP 159/63 | HR 93 | Temp 98.1°F | Resp 20

## 2024-09-07 DIAGNOSIS — D538 Other specified nutritional anemias: Secondary | ICD-10-CM | POA: Diagnosis not present

## 2024-09-07 DIAGNOSIS — E538 Deficiency of other specified B group vitamins: Secondary | ICD-10-CM | POA: Diagnosis not present

## 2024-09-07 MED ORDER — CYANOCOBALAMIN 1000 MCG/ML IJ SOLN
1000.0000 ug | Freq: Once | INTRAMUSCULAR | Status: AC
  Administered 2024-09-07: 1000 ug via INTRAMUSCULAR
  Filled 2024-09-07: qty 1

## 2024-09-07 NOTE — Patient Instructions (Signed)
 Vitamin B12 Injection What is this medication? Vitamin B12 (VAHY tuh min B12) prevents and treats low vitamin B12 levels in your body. It is used in people who do not get enough vitamin B12 from their diet or when their digestive tract does not absorb enough. Vitamin B12 plays an important role in maintaining the health of your nervous system and red blood cells. This medicine may be used for other purposes; ask your health care provider or pharmacist if you have questions. COMMON BRAND NAME(S): B-12 Compliance Kit, B-12 Injection Kit, Cyomin, Dodex , LA-12, Nutri-Twelve, Physicians EZ Use B-12, Primabalt, Vitamin Deficiency Injectable System - B12 What should I tell my care team before I take this medication? They need to know if you have any of these conditions: Kidney disease Leber's disease Megaloblastic anemia An unusual or allergic reaction to cyanocobalamin , cobalt, other medications, foods, dyes, or preservatives Pregnant or trying to get pregnant Breast-feeding How should I use this medication? This medication is injected into a muscle or deeply under the skin. It is usually given in a clinic or care team's office. However, your care team may teach you how to inject yourself. Follow all instructions. Talk to your care team about the use of this medication in children. Special care may be needed. Overdosage: If you think you have taken too much of this medicine contact a poison control center or emergency room at once. NOTE: This medicine is only for you. Do not share this medicine with others. What if I miss a dose? If you are given your dose at a clinic or care team's office, call to reschedule your appointment. If you give your own injections, and you miss a dose, take it as soon as you can. If it is almost time for your next dose, take only that dose. Do not take double or extra doses. What may interact with this medication? Alcohol Colchicine This list may not describe all possible  interactions. Give your health care provider a list of all the medicines, herbs, non-prescription drugs, or dietary supplements you use. Also tell them if you smoke, drink alcohol, or use illegal drugs. Some items may interact with your medicine. What should I watch for while using this medication? Visit your care team regularly. You may need blood work done while you are taking this medication. You may need to follow a special diet. Talk to your care team. Limit your alcohol intake and avoid smoking to get the best benefit. What side effects may I notice from receiving this medication? Side effects that you should report to your care team as soon as possible: Allergic reactions--skin rash, itching, hives, swelling of the face, lips, tongue, or throat Swelling of the ankles, hands, or feet Trouble breathing Side effects that usually do not require medical attention (report to your care team if they continue or are bothersome): Diarrhea This list may not describe all possible side effects. Call your doctor for medical advice about side effects. You may report side effects to FDA at 1-800-FDA-1088. Where should I keep my medication? Keep out of the reach of children. Store at room temperature between 15 and 30 degrees C (59 and 85 degrees F). Protect from light. Throw away any unused medication after the expiration date. NOTE: This sheet is a summary. It may not cover all possible information. If you have questions about this medicine, talk to your doctor, pharmacist, or health care provider.  2024 Elsevier/Gold Standard (2021-07-25 00:00:00)

## 2024-09-10 ENCOUNTER — Ambulatory Visit

## 2024-09-15 ENCOUNTER — Encounter: Payer: Self-pay | Admitting: Oncology

## 2024-10-05 ENCOUNTER — Inpatient Hospital Stay: Attending: Oncology

## 2024-10-05 VITALS — BP 143/55 | HR 86 | Temp 98.0°F | Resp 20

## 2024-10-05 DIAGNOSIS — E538 Deficiency of other specified B group vitamins: Secondary | ICD-10-CM | POA: Diagnosis not present

## 2024-10-05 MED ORDER — CYANOCOBALAMIN 1000 MCG/ML IJ SOLN
1000.0000 ug | Freq: Once | INTRAMUSCULAR | Status: AC
  Administered 2024-10-05: 1000 ug via INTRAMUSCULAR
  Filled 2024-10-05: qty 1

## 2024-10-05 NOTE — Patient Instructions (Signed)
 Vitamin B12 Injection What is this medication? Vitamin B12 (VAHY tuh min B12) prevents and treats low vitamin B12 levels in your body. It is used in people who do not get enough vitamin B12 from their diet or when their digestive tract does not absorb enough. Vitamin B12 plays an important role in maintaining the health of your nervous system and red blood cells. This medicine may be used for other purposes; ask your health care provider or pharmacist if you have questions. COMMON BRAND NAME(S): B-12 Compliance Kit, B-12 Injection Kit, Cyomin, Dodex , LA-12, Nutri-Twelve, Physicians EZ Use B-12, Primabalt, Vitamin Deficiency Injectable System - B12 What should I tell my care team before I take this medication? They need to know if you have any of these conditions: Kidney disease Leber's disease Megaloblastic anemia An unusual or allergic reaction to cyanocobalamin , cobalt, other medications, foods, dyes, or preservatives Pregnant or trying to get pregnant Breast-feeding How should I use this medication? This medication is injected into a muscle or deeply under the skin. It is usually given in a clinic or care team's office. However, your care team may teach you how to inject yourself. Follow all instructions. Talk to your care team about the use of this medication in children. Special care may be needed. Overdosage: If you think you have taken too much of this medicine contact a poison control center or emergency room at once. NOTE: This medicine is only for you. Do not share this medicine with others. What if I miss a dose? If you are given your dose at a clinic or care team's office, call to reschedule your appointment. If you give your own injections, and you miss a dose, take it as soon as you can. If it is almost time for your next dose, take only that dose. Do not take double or extra doses. What may interact with this medication? Alcohol Colchicine This list may not describe all possible  interactions. Give your health care provider a list of all the medicines, herbs, non-prescription drugs, or dietary supplements you use. Also tell them if you smoke, drink alcohol, or use illegal drugs. Some items may interact with your medicine. What should I watch for while using this medication? Visit your care team regularly. You may need blood work done while you are taking this medication. You may need to follow a special diet. Talk to your care team. Limit your alcohol intake and avoid smoking to get the best benefit. What side effects may I notice from receiving this medication? Side effects that you should report to your care team as soon as possible: Allergic reactions--skin rash, itching, hives, swelling of the face, lips, tongue, or throat Swelling of the ankles, hands, or feet Trouble breathing Side effects that usually do not require medical attention (report to your care team if they continue or are bothersome): Diarrhea This list may not describe all possible side effects. Call your doctor for medical advice about side effects. You may report side effects to FDA at 1-800-FDA-1088. Where should I keep my medication? Keep out of the reach of children. Store at room temperature between 15 and 30 degrees C (59 and 85 degrees F). Protect from light. Throw away any unused medication after the expiration date. NOTE: This sheet is a summary. It may not cover all possible information. If you have questions about this medicine, talk to your doctor, pharmacist, or health care provider.  2024 Elsevier/Gold Standard (2021-07-25 00:00:00)

## 2024-11-01 ENCOUNTER — Other Ambulatory Visit: Payer: Self-pay | Admitting: Oncology

## 2024-11-01 DIAGNOSIS — C8128 Mixed cellularity classical Hodgkin lymphoma, lymph nodes of multiple sites: Secondary | ICD-10-CM

## 2024-11-01 NOTE — Progress Notes (Unsigned)
 [facility]  [address] [place],  [place]  [address] [phone]  Clinic Day:  11/01/2024  Referring physician: Clemmie Nest, MD   HISTORY OF PRESENT ILLNESS:  The patient is a 78 y.o. 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  She completed 2 cycles of brentuximab vedotin  before completing 5 of 6 planned cycles of Adriamycin /vinblastine /dacarbazine  (AVD) chemotherapy in September 2023.  Due to her extreme fatigue and weakness, she did not receive her sixth and final cycle of AVD chemotherapy.  PET scan done after her therapy showed her to have had a complete response.  CT chest, abdomen and pelvis, as well as MRI brain in December did not reveal any radiographic evidence of disease recurrence.  Since her last visit, the patient has been doing fairly well.  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 She also reports continued tinnitus and vertigo. She otherwise denies having any B symptoms which concern her for overt signs of disease recurrence.     PHYSICAL EXAM:  There were no vitals taken for this visit. Wt Readings from Last 3 Encounters:  08/10/24 137 lb 1.6 oz (62.2 kg)  03/09/24 134 lb 12.8 oz (61.1 kg)  10/30/23 132 lb 8 oz (60.1 kg)   There is no height or weight on file to calculate BMI.  Performance status (ECOG): 1 - Symptomatic but completely ambulatory  Physical Exam Vitals and nursing note reviewed.  Constitutional:      General: She is not in acute distress.    Appearance: Normal appearance.  HENT:     Head: Normocephalic and atraumatic.     Mouth/Throat:     Mouth: Mucous membranes are moist.     Pharynx: Oropharynx is clear. No oropharyngeal exudate or posterior oropharyngeal erythema.  Eyes:     General: No scleral icterus.    Extraocular Movements: Extraocular movements intact.     Conjunctiva/sclera: Conjunctivae normal.     Pupils: Pupils are equal, round, and reactive to  light.  Cardiovascular:     Rate and Rhythm: Normal rate and regular rhythm.     Heart sounds: Normal heart sounds. No murmur heard.    No friction rub. No gallop.  Pulmonary:     Effort: Pulmonary effort is normal.     Breath sounds: Normal breath sounds. No wheezing, rhonchi or rales.  Abdominal:     General: There is no distension.     Palpations: Abdomen is soft. There is no hepatomegaly, splenomegaly or mass.     Tenderness: There is no abdominal tenderness.  Musculoskeletal:        General: Normal range of motion.     Cervical back: Normal range of motion and neck supple. No tenderness.     Right lower leg: No edema.     Left lower leg: No edema.  Lymphadenopathy:     Cervical: No cervical adenopathy.     Upper Body:     Right upper body: No supraclavicular or axillary adenopathy.     Left upper body: No supraclavicular or axillary adenopathy.     Lower Body: No right inguinal adenopathy. No left inguinal adenopathy.  Skin:    General: Skin is warm and dry.     Coloration: Skin is not jaundiced.     Findings: No rash.  Neurological:     Mental Status: She is alert and oriented to person, place, and time.     Cranial Nerves: No cranial  nerve deficit.  Psychiatric:        Mood and Affect: Mood normal.        Behavior: Behavior normal.        Thought Content: Thought content normal.     LABS:      Latest Ref Rng & Units 08/10/2024    2:32 PM 07/09/2024    2:44 PM 03/09/2024    2:30 PM  CBC  WBC 4.0 - 10.5 K/uL 3.6  3.0  3.6   Hemoglobin 12.0 - 15.0 g/dL 89.4  89.0  89.1   Hematocrit 36.0 - 46.0 % 32.1  32.2  32.1   Platelets 150 - 400 K/uL 115  119  113       Latest Ref Rng & Units 08/10/2024    2:32 PM 07/09/2024    2:44 PM 03/09/2024    2:30 PM  CMP  Glucose 70 - 99 mg/dL 853  833  870   BUN 8 - 23 mg/dL 22  27  26    Creatinine 0.44 - 1.00 mg/dL 8.89  8.90  8.84   Sodium 135 - 145 mmol/L 141  139  142   Potassium 3.5 - 5.1 mmol/L 4.5  4.5  4.8   Chloride 98  - 111 mmol/L 105  104  106   CO2 22 - 32 mmol/L 24  24  26    Calcium 8.9 - 10.3 mg/dL 9.6  9.6  9.8   Total Protein 6.5 - 8.1 g/dL 6.8  6.7  6.5   Total Bilirubin 0.0 - 1.2 mg/dL 0.4  0.4  0.3   Alkaline Phos 38 - 126 U/L 81  92  97   AST 15 - 41 U/L 22  25  20    ALT 0 - 44 U/L 11  18  13      Lab Results  Component Value Date   TIBC 309 08/10/2024   TIBC 302 07/09/2024   TIBC 301 10/15/2023   FERRITIN 427 (H) 08/10/2024   FERRITIN 396 (H) 07/09/2024   FERRITIN 349 (H) 10/15/2023   IRONPCTSAT 19 08/10/2024   IRONPCTSAT 21 07/09/2024   IRONPCTSAT 18 10/15/2023   Lab Results  Component Value Date   LDH 195 (H) 03/09/2024   LDH 207 (H) 10/15/2023   LDH 148 06/14/2023       Component Value Date/Time   LDH 195 (H) 03/09/2024 1430    Review Flowsheet  More data exists      Latest Ref Rng & Units 03/09/2024 07/09/2024 08/10/2024  Oncology Labs  Ferritin 11 - 307 ng/mL - 396  427   %SAT 10.4 - 31.8 % - 21  19   LDH 98 - 192 U/L 195  - -     STUDIES:  No results found.    ASSESSMENT & PLAN:   Assessment/Plan:  78 y.o. female with stage IIIA classical Hodgkin lymphoma, nodular sclerosis subtype. She has had mild chronic pancytopenia since receiving chemotherapy. She was found to have B12 and folate deficiency, but the pancytopenia persists despite replacing these vitamins. She remains without evidence of disease.  Clinically, the patient continues to do fairly well.   She knows to continue taking her monthly B12 shots and folate as previously prescribed.  Otherwise, I will see her back in 4 months for her continued Hodgkin lymphoma surveillance.   The patient understands all the plans discussed today and is in agreement with them.  She knows to contact our office if she develops concerns prior to her  next appointment.     Dawon Troop DELENA Kerns, MD   Family Nurse Practitioner - Board Certified [facility] [phone]

## 2024-11-02 ENCOUNTER — Inpatient Hospital Stay: Admitting: Oncology

## 2024-11-02 ENCOUNTER — Other Ambulatory Visit: Payer: Self-pay | Admitting: Oncology

## 2024-11-02 ENCOUNTER — Inpatient Hospital Stay: Attending: Oncology

## 2024-11-02 ENCOUNTER — Inpatient Hospital Stay

## 2024-11-02 ENCOUNTER — Ambulatory Visit

## 2024-11-02 ENCOUNTER — Other Ambulatory Visit: Payer: Self-pay

## 2024-11-02 VITALS — BP 160/75 | HR 87 | Temp 97.9°F | Resp 16 | Ht 62.5 in | Wt 139.9 lb

## 2024-11-02 DIAGNOSIS — E538 Deficiency of other specified B group vitamins: Secondary | ICD-10-CM

## 2024-11-02 DIAGNOSIS — Z9221 Personal history of antineoplastic chemotherapy: Secondary | ICD-10-CM | POA: Insufficient documentation

## 2024-11-02 DIAGNOSIS — R5383 Other fatigue: Secondary | ICD-10-CM | POA: Diagnosis not present

## 2024-11-02 DIAGNOSIS — C8118 Nodular sclerosis classical Hodgkin lymphoma, lymph nodes of multiple sites: Secondary | ICD-10-CM | POA: Diagnosis not present

## 2024-11-02 DIAGNOSIS — R42 Dizziness and giddiness: Secondary | ICD-10-CM | POA: Insufficient documentation

## 2024-11-02 DIAGNOSIS — C8128 Mixed cellularity classical Hodgkin lymphoma, lymph nodes of multiple sites: Secondary | ICD-10-CM

## 2024-11-02 DIAGNOSIS — Z8571 Personal history of Hodgkin lymphoma: Secondary | ICD-10-CM | POA: Insufficient documentation

## 2024-11-02 DIAGNOSIS — C8110 Nodular sclerosis classical Hodgkin lymphoma, unspecified site: Secondary | ICD-10-CM

## 2024-11-02 LAB — CMP (CANCER CENTER ONLY)
ALT: 16 U/L (ref 0–44)
AST: 30 U/L (ref 15–41)
Albumin: 4.3 g/dL (ref 3.5–5.0)
Alkaline Phosphatase: 82 U/L (ref 38–126)
Anion gap: 10 (ref 5–15)
BUN: 23 mg/dL (ref 8–23)
CO2: 26 mmol/L (ref 22–32)
Calcium: 9.8 mg/dL (ref 8.9–10.3)
Chloride: 105 mmol/L (ref 98–111)
Creatinine: 1.44 mg/dL — ABNORMAL HIGH (ref 0.44–1.00)
GFR, Estimated: 38 mL/min — ABNORMAL LOW (ref >60)
Glucose, Bld: 109 mg/dL — ABNORMAL HIGH (ref 70–99)
Potassium: 4.1 mmol/L (ref 3.5–5.1)
Sodium: 140 mmol/L (ref 135–145)
Total Bilirubin: 0.4 mg/dL (ref 0.0–1.2)
Total Protein: 6.6 g/dL (ref 6.5–8.1)

## 2024-11-02 LAB — CBC WITH DIFFERENTIAL (CANCER CENTER ONLY)
Abs Immature Granulocytes: 0.01 K/uL (ref 0.00–0.07)
Basophils Absolute: 0 K/uL (ref 0.0–0.1)
Basophils Relative: 1 %
Eosinophils Absolute: 0.1 K/uL (ref 0.0–0.5)
Eosinophils Relative: 2 %
HCT: 33.1 % — ABNORMAL LOW (ref 36.0–46.0)
Hemoglobin: 10.8 g/dL — ABNORMAL LOW (ref 12.0–15.0)
Immature Granulocytes: 0 %
Lymphocytes Relative: 29 %
Lymphs Abs: 1.2 K/uL (ref 0.7–4.0)
MCH: 32.8 pg (ref 26.0–34.0)
MCHC: 32.6 g/dL (ref 30.0–36.0)
MCV: 100.6 fL — ABNORMAL HIGH (ref 80.0–100.0)
Monocytes Absolute: 0.4 K/uL (ref 0.1–1.0)
Monocytes Relative: 9 %
Neutro Abs: 2.4 K/uL (ref 1.7–7.7)
Neutrophils Relative %: 59 %
Platelet Count: 126 K/uL — ABNORMAL LOW (ref 150–400)
RBC: 3.29 MIL/uL — ABNORMAL LOW (ref 3.87–5.11)
RDW: 13.3 % (ref 11.5–15.5)
WBC Count: 4.1 K/uL (ref 4.0–10.5)
nRBC: 0 % (ref 0.0–0.2)

## 2024-11-02 LAB — LACTATE DEHYDROGENASE: LDH: 206 U/L (ref 105–235)

## 2024-11-02 LAB — FERRITIN: Ferritin: 672 ng/mL — ABNORMAL HIGH (ref 11–307)

## 2024-11-02 LAB — IRON AND TIBC
Iron: 75 ug/dL (ref 28–170)
Saturation Ratios: 23 % (ref 10.4–31.8)
TIBC: 322 ug/dL (ref 250–450)
UIBC: 247 ug/dL

## 2024-11-02 LAB — FOLATE: Folate: 9.5 ng/mL (ref >5.9)

## 2024-11-02 LAB — VITAMIN B12: Vitamin B-12: 696 pg/mL (ref 180–914)

## 2024-11-02 LAB — SEDIMENTATION RATE: Sed Rate: 16 mm/h (ref 0–22)

## 2024-11-02 MED ORDER — CYANOCOBALAMIN 1000 MCG/ML IJ SOLN
1000.0000 ug | Freq: Once | INTRAMUSCULAR | Status: AC
  Administered 2024-11-02: 1000 ug via INTRAMUSCULAR
  Filled 2024-11-02: qty 1

## 2024-11-02 NOTE — Patient Instructions (Signed)
 Vitamin B12 Deficiency Vitamin B12 deficiency means that your body does not have enough vitamin B12. The body needs this important vitamin: To make red blood cells. To make genes (DNA). To help the nerves work. If you do not have enough vitamin B12 in your body, you can have health problems, such as not having enough red blood cells in the blood (anemia). What are the causes? Not eating enough foods that contain vitamin B12. Not being able to take in (absorb) vitamin B12 from the food that you eat. Certain diseases. A condition in which the body does not make enough of a certain protein. This results in your body not taking in enough vitamin B12. Having a surgery in which part of the stomach or small intestine is taken out. Taking medicines that make it hard for the body to take in vitamin B12. These include: Heartburn medicines. Some medicines that are used to treat diabetes. What increases the risk? Being an older adult. Eating a vegetarian or vegan diet that does not include any foods that come from animals. Not eating enough foods that contain vitamin B12 while you are pregnant. Taking certain medicines. Having alcoholism. What are the signs or symptoms? In some cases, there are no symptoms. If the condition leads to too few blood cells or nerve damage, symptoms can occur, such as: Feeling weak or tired. Not being hungry. Losing feeling (numbness) or tingling in your hands and feet. Redness and burning of the tongue. Feeling sad (depressed). Confusion or memory problems. Trouble walking. If anemia is very bad, symptoms can include: Being short of breath. Being dizzy. Having a very fast heartbeat. How is this treated? Changing the way you eat and drink, such as: Eating more foods that contain vitamin B12. Drinking little or no alcohol. Getting vitamin B12 shots. Taking vitamin B12 supplements by mouth (orally). Your doctor will tell you the dose that is best for you. Follow  these instructions at home: Eating and drinking  Eat foods that come from animals and have a lot of vitamin B12 in them. These include: Meats and poultry. This includes beef, pork, chicken, [place], and organ meats, such as liver. Seafood, such as clams, rainbow trout, salmon, tuna, and haddock. Eggs. Dairy foods such as milk, yogurt, and cheese. Eat breakfast cereals that have vitamin B12 added to them (are fortified). Check the label. The items listed above may not be a complete list of foods and beverages you can eat and drink. Contact a dietitian for more information. Alcohol use Do not drink alcohol if: Your doctor tells you not to drink. You are pregnant, may be pregnant, or are planning to become pregnant. If you drink alcohol: Limit how much you have to: 0-1 drink a day for women. 0-2 drinks a day for men. Know how much alcohol is in your drink. In the [place] one drink equals one 12 oz bottle of beer (355 mL), one 5 oz glass of wine (148 mL), or one 1 oz glass of hard liquor (44 mL). General instructions Get any vitamin B12 shots if told by your doctor. Take supplements only as told by your doctor. Follow the directions. Keep all follow-up visits. Contact a doctor if: Your symptoms come back. Your symptoms get worse or do not get better with treatment. Get help right away if: You have trouble breathing. You have a very fast heartbeat. You have chest pain. You get dizzy. You faint. These symptoms may be an emergency. Get help right away. Call 911.  Do not wait to see if the symptoms will go away. Do not drive yourself to the hospital. Summary Vitamin B12 deficiency means that your body is not getting enough of the vitamin. In some cases, there are no symptoms of this condition. Treatment may include making a change in the way you eat and drink, getting shots, or taking supplements. Eat foods that have vitamin B12 in them. This information is not intended to replace advice  given to you by your health care provider. Make sure you discuss any questions you have with your health care provider. Document Revised: 07/07/2021 Document Reviewed: 07/07/2021 Elsevier Patient Education  2024 [employer].

## 2024-11-04 ENCOUNTER — Encounter: Payer: Self-pay | Admitting: Oncology

## 2024-11-04 LAB — COPPER, SERUM: Copper: 86 ug/dL (ref 80–158)

## 2024-11-30 ENCOUNTER — Inpatient Hospital Stay: Attending: Oncology

## 2024-11-30 VITALS — BP 139/77 | HR 91 | Temp 98.1°F | Resp 16

## 2024-11-30 DIAGNOSIS — E538 Deficiency of other specified B group vitamins: Secondary | ICD-10-CM | POA: Insufficient documentation

## 2024-11-30 MED ORDER — CYANOCOBALAMIN 1000 MCG/ML IJ SOLN
1000.0000 ug | Freq: Once | INTRAMUSCULAR | Status: AC
  Administered 2024-11-30: 1000 ug via INTRAMUSCULAR
  Filled 2024-11-30: qty 1

## 2024-11-30 NOTE — Patient Instructions (Signed)
 Vitamin B12 Injection What is this medication? Vitamin B12 (VAHY tuh min B12) prevents and treats low vitamin B12 levels in your body. It is used in people who do not get enough vitamin B12 from their diet or when their digestive tract does not absorb enough. Vitamin B12 plays an important role in maintaining the health of your nervous system and red blood cells. This medicine may be used for other purposes; ask your health care provider or pharmacist if you have questions. COMMON BRAND NAME(S): B-12 Compliance Kit, B-12 Injection Kit, Cyomin, Dodex , LA-12, Nutri-Twelve, Physicians EZ Use B-12, Primabalt, Vitamin Deficiency Injectable System - B12 What should I tell my care team before I take this medication? They need to know if you have any of these conditions: Kidney disease Leber's disease Megaloblastic anemia An unusual or allergic reaction to cyanocobalamin , cobalt, other medications, foods, dyes, or preservatives Pregnant or trying to get pregnant Breast-feeding How should I use this medication? This medication is injected into a muscle or deeply under the skin. It is usually given in a clinic or care team's office. However, your care team may teach you how to inject yourself. Follow all instructions. Talk to your care team about the use of this medication in children. Special care may be needed. Overdosage: If you think you have taken too much of this medicine contact a poison control center or emergency room at once. NOTE: This medicine is only for you. Do not share this medicine with others. What if I miss a dose? If you are given your dose at a clinic or care team's office, call to reschedule your appointment. If you give your own injections, and you miss a dose, take it as soon as you can. If it is almost time for your next dose, take only that dose. Do not take double or extra doses. What may interact with this medication? Alcohol Colchicine This list may not describe all possible  interactions. Give your health care provider a list of all the medicines, herbs, non-prescription drugs, or dietary supplements you use. Also tell them if you smoke, drink alcohol, or use illegal drugs. Some items may interact with your medicine. What should I watch for while using this medication? Visit your care team regularly. You may need blood work done while you are taking this medication. You may need to follow a special diet. Talk to your care team. Limit your alcohol intake and avoid smoking to get the best benefit. What side effects may I notice from receiving this medication? Side effects that you should report to your care team as soon as possible: Allergic reactions--skin rash, itching, hives, swelling of the face, lips, tongue, or throat Swelling of the ankles, hands, or feet Trouble breathing Side effects that usually do not require medical attention (report to your care team if they continue or are bothersome): Diarrhea This list may not describe all possible side effects. Call your doctor for medical advice about side effects. You may report side effects to FDA at 1-800-FDA-1088. Where should I keep my medication? Keep out of the reach of children. Store at room temperature between 15 and 30 degrees C (59 and 85 degrees F). Protect from light. Throw away any unused medication after the expiration date. NOTE: This sheet is a summary. It may not cover all possible information. If you have questions about this medicine, talk to your doctor, pharmacist, or health care provider.  2024 Elsevier/Gold Standard (2021-07-25 00:00:00)

## 2024-12-28 ENCOUNTER — Inpatient Hospital Stay

## 2025-01-04 ENCOUNTER — Inpatient Hospital Stay: Attending: Oncology

## 2025-02-22 ENCOUNTER — Inpatient Hospital Stay

## 2025-03-22 ENCOUNTER — Inpatient Hospital Stay

## 2025-04-20 ENCOUNTER — Inpatient Hospital Stay

## 2025-05-03 ENCOUNTER — Inpatient Hospital Stay: Admitting: Oncology

## 2025-05-03 ENCOUNTER — Inpatient Hospital Stay
